# Patient Record
Sex: Male | Born: 1965 | Race: White | Hispanic: No | Marital: Single | Smoking: Former smoker
Health system: Southern US, Community
[De-identification: ages and names within clinical notes are randomized; demographics above are authoritative.]

---

## 1998-06-13 ENCOUNTER — Emergency Department (HOSPITAL_COMMUNITY): Admission: EM | Admit: 1998-06-13 | Discharge: 1998-06-13 | Payer: Self-pay | Admitting: Emergency Medicine

## 2020-10-05 ENCOUNTER — Ambulatory Visit
Admission: EM | Admit: 2020-10-05 | Discharge: 2020-10-05 | Disposition: A | Discharge status: Home / Self Care | Payer: Self-pay | Attending: Emergency Medicine | Admitting: Emergency Medicine

## 2020-10-05 ENCOUNTER — Other Ambulatory Visit: Payer: Self-pay

## 2020-10-05 ENCOUNTER — Encounter: Payer: Self-pay | Admitting: Emergency Medicine

## 2020-10-05 DIAGNOSIS — M778 Other enthesopathies, not elsewhere classified: Secondary | ICD-10-CM

## 2020-10-05 DIAGNOSIS — Z23 Encounter for immunization: Secondary | ICD-10-CM

## 2020-10-05 MED ORDER — CEPHALEXIN 500 MG PO CAPS: 500.0000 mg | ORAL_CAPSULE | Freq: Four times a day (QID) | ORAL | 0 refills | Status: DC

## 2020-10-05 MED ORDER — TETANUS-DIPHTH-ACELL PERTUSSIS 5-2.5-18.5 LF-MCG/0.5 IM SUSP
0.5000 mL | Freq: Once | INTRAMUSCULAR | Status: AC
  Administered 2020-10-05: 0.5 mL via INTRAMUSCULAR

## 2020-10-05 MED ORDER — PREDNISONE 10 MG (21) PO TBPK: ORAL_TABLET | Freq: Every day | ORAL | 0 refills | Status: DC

## 2020-10-05 MED ORDER — KETOROLAC TROMETHAMINE 60 MG/2ML IM SOLN
60.0000 mg | Freq: Once | INTRAMUSCULAR | Status: AC
  Administered 2020-10-05: 60 mg via INTRAMUSCULAR

## 2020-10-05 NOTE — ED Triage Notes (Signed)
Pt presents with abscess to the right elbow x 1 week. Denies fevers. Pt reports "cutting it open myself to get the infection out."

## 2020-10-05 NOTE — ED Provider Notes (Signed)
[place]   [id number] 10/05/20 Arrival Time: [place]   Chief Complaint  Patient presents with  . Abscess    SUBJECTIVE: History from: patient.  Dylan Shaw is a 54 y.o. male who presented to the urgent care with a complaint of right elbow pain and swelling for the past 1 week.  Denies any precipitating event.  Stated he completed an incision to the area a few days ago.  Stated discharge was clear yellowish.  Localized pain to right elbow.  He describes the pain as constant and achy.  He has tried OTC medications without relief.  His symptoms are made worse with ROM.  He denies similar symptoms in the past.   Unknown tetanus immunization status  ROS: As per HPI.  All other pertinent ROS negative.     History reviewed. No pertinent past medical history. History reviewed. No pertinent surgical history. Not on File No current facility-administered medications on file prior to encounter.   No current outpatient medications on file prior to encounter.   Social History   Socioeconomic History  . Marital status: Single    Spouse name: Not on file  . Number of children: Not on file  . Years of education: Not on file  . Highest education level: Not on file  Occupational History  . Not on file  Tobacco Use  . Smoking status: Not on file  Substance and Sexual Activity  . Alcohol use: Not on file  . Drug use: Not on file  . Sexual activity: Not on file  Other Topics Concern  . Not on file  Social History Narrative  . Not on file   Social Determinants of Health   Financial Resource Strain:   . Difficulty of Paying Living Expenses: Not on file  Food Insecurity:   . Worried About Charity fundraiser in the Last Year: Not on file  . Ran Out of Food in the Last Year: Not on file  Transportation Needs:   . Lack of Transportation (Medical): Not on file  . Lack of Transportation (Non-Medical): Not on file  Physical Activity:   . Days of Exercise per Week: Not on  file  . Minutes of Exercise per Session: Not on file  Stress:   . Feeling of Stress : Not on file  Social Connections:   . Frequency of Communication with Friends and Family: Not on file  . Frequency of Social Gatherings with Friends and Family: Not on file  . Attends Religious Services: Not on file  . Active Member of Clubs or Organizations: Not on file  . Attends Archivist Meetings: Not on file  . Marital Status: Not on file  Intimate Partner Violence:   . Fear of Current or Ex-Partner: Not on file  . Emotionally Abused: Not on file  . Physically Abused: Not on file  . Sexually Abused: Not on file   No family history on file.  OBJECTIVE:  Vitals:   10/05/20 0943 10/05/20 1032  BP: (!) 201/108 (!) 140/91  Pulse: 79   Resp: 17   Temp: 98.4 F (36.9 C)   TempSrc: Oral   SpO2: 96%      Physical Exam Vitals and nursing note reviewed.  Constitutional:      General: He is not in acute distress.    Appearance: Normal appearance. He is normal weight. He is not ill-appearing, toxic-appearing or diaphoretic.  Cardiovascular:     Rate and Rhythm: Normal rate and regular rhythm.  Pulses: Normal pulses.     Heart sounds: Normal heart sounds. No murmur heard.  No friction rub. No gallop.   Pulmonary:     Effort: Pulmonary effort is normal. No respiratory distress.     Breath sounds: Normal breath sounds. No stridor. No wheezing, rhonchi or rales.  Chest:     Chest wall: No tenderness.  Musculoskeletal:        General: Tenderness present.     Right elbow: Swelling and deformity present.     Left elbow: Normal.     Comments: The right elbow is with obvious deformity when compared to the left elbow.  Swelling, open wound, scars present.  No ecchymosis, lesion present.  Normal range of motion.  Neurovascular status intact.  Neurological:     Mental Status: He is alert and oriented to person, place, and time.     LABS:  No results found for this or any previous  visit (from the past 24 hour(s)).   ASSESSMENT & PLAN:  1. Right elbow tendinitis     Meds ordered this encounter  Medications  . Tdap (BOOSTRIX) injection 0.5 mL  . ketorolac (TORADOL) injection 60 mg  . cephALEXin (KEFLEX) 500 MG capsule    Sig: Take 1 capsule (500 mg total) by mouth 4 (four) times daily.    Dispense:  20 capsule    Refill:  0  . predniSONE (STERAPRED UNI-PAK 21 TAB) 10 MG (21) TBPK tablet    Sig: Take by mouth daily. Take 6 tabs by mouth daily  for 1 days, then 5 tabs for 1 days, then 4 tabs for 1 days, then 3 tabs for 1 days, 2 tabs for 1 days, then 1 tab by mouth daily for 1 days    Dispense:  21 tablet    Refill:  0   Patient is stable at discharge.  Tdap was given as he perform an incision at home.     Discharge instructions  Continue to take OTC Tylenol/ibuprofen as needed for pain Toradol IM was given in office for pain Prednisone was prescribed/take as directed Keflex was prescribed.  Take as directed Follow-up with PCP for further evaluation Return or go to ED for worsening of symptoms   Reviewed expectations re: course of current medical issues. Questions answered. Outlined signs and symptoms indicating need for more acute intervention. Patient verbalized understanding. After Visit Summary given.         Emerson Monte, FNP 10/05/20 1047

## 2020-10-05 NOTE — Discharge Instructions (Addendum)
Continue to take OTC Tylenol/ibuprofen as needed for pain Toradol IM was given in office for pain Prednisone was prescribed/take as directed Keflex was prescribed.  Take as directed Follow-up with PCP for further evaluation Return or go to ED for worsening of symptoms

## 2021-05-24 ENCOUNTER — Encounter (HOSPITAL_COMMUNITY): Payer: Self-pay

## 2021-05-24 ENCOUNTER — Inpatient Hospital Stay (HOSPITAL_COMMUNITY)
Admission: EM | Admit: 2021-05-24 | Discharge: 2021-06-28 | DRG: 028 | Disposition: A | Discharge status: Hospice | Payer: 59 | Attending: Internal Medicine | Admitting: Internal Medicine

## 2021-05-24 ENCOUNTER — Emergency Department (HOSPITAL_COMMUNITY): Payer: 59

## 2021-05-24 ENCOUNTER — Other Ambulatory Visit: Payer: Self-pay

## 2021-05-24 DIAGNOSIS — D630 Anemia in neoplastic disease: Secondary | ICD-10-CM | POA: Diagnosis present

## 2021-05-24 DIAGNOSIS — R54 Age-related physical debility: Secondary | ICD-10-CM | POA: Diagnosis present

## 2021-05-24 DIAGNOSIS — G40109 Localization-related (focal) (partial) symptomatic epilepsy and epileptic syndromes with simple partial seizures, not intractable, without status epilepticus: Secondary | ICD-10-CM | POA: Diagnosis not present

## 2021-05-24 DIAGNOSIS — E43 Unspecified severe protein-calorie malnutrition: Secondary | ICD-10-CM | POA: Diagnosis present

## 2021-05-24 DIAGNOSIS — Z20822 Contact with and (suspected) exposure to covid-19: Secondary | ICD-10-CM | POA: Diagnosis present

## 2021-05-24 DIAGNOSIS — C3490 Malignant neoplasm of unspecified part of unspecified bronchus or lung: Secondary | ICD-10-CM | POA: Diagnosis present

## 2021-05-24 DIAGNOSIS — S22000A Wedge compression fracture of unspecified thoracic vertebra, initial encounter for closed fracture: Secondary | ICD-10-CM | POA: Diagnosis not present

## 2021-05-24 DIAGNOSIS — C801 Malignant (primary) neoplasm, unspecified: Secondary | ICD-10-CM | POA: Diagnosis not present

## 2021-05-24 DIAGNOSIS — Z751 Person awaiting admission to adequate facility elsewhere: Secondary | ICD-10-CM

## 2021-05-24 DIAGNOSIS — Z7189 Other specified counseling: Secondary | ICD-10-CM | POA: Diagnosis not present

## 2021-05-24 DIAGNOSIS — D492 Neoplasm of unspecified behavior of bone, soft tissue, and skin: Secondary | ICD-10-CM | POA: Diagnosis not present

## 2021-05-24 DIAGNOSIS — M40204 Unspecified kyphosis, thoracic region: Secondary | ICD-10-CM | POA: Diagnosis present

## 2021-05-24 DIAGNOSIS — C7989 Secondary malignant neoplasm of other specified sites: Secondary | ICD-10-CM | POA: Diagnosis present

## 2021-05-24 DIAGNOSIS — G936 Cerebral edema: Secondary | ICD-10-CM | POA: Diagnosis present

## 2021-05-24 DIAGNOSIS — C3491 Malignant neoplasm of unspecified part of right bronchus or lung: Secondary | ICD-10-CM | POA: Diagnosis not present

## 2021-05-24 DIAGNOSIS — R52 Pain, unspecified: Secondary | ICD-10-CM

## 2021-05-24 DIAGNOSIS — C7951 Secondary malignant neoplasm of bone: Secondary | ICD-10-CM | POA: Diagnosis not present

## 2021-05-24 DIAGNOSIS — G40209 Localization-related (focal) (partial) symptomatic epilepsy and epileptic syndromes with complex partial seizures, not intractable, without status epilepticus: Secondary | ICD-10-CM | POA: Diagnosis not present

## 2021-05-24 DIAGNOSIS — L89152 Pressure ulcer of sacral region, stage 2: Secondary | ICD-10-CM | POA: Diagnosis not present

## 2021-05-24 DIAGNOSIS — Z66 Do not resuscitate: Secondary | ICD-10-CM | POA: Diagnosis not present

## 2021-05-24 DIAGNOSIS — Z515 Encounter for palliative care: Secondary | ICD-10-CM

## 2021-05-24 DIAGNOSIS — R4701 Aphasia: Secondary | ICD-10-CM | POA: Diagnosis not present

## 2021-05-24 DIAGNOSIS — G9529 Other cord compression: Secondary | ICD-10-CM | POA: Diagnosis not present

## 2021-05-24 DIAGNOSIS — R0602 Shortness of breath: Secondary | ICD-10-CM | POA: Diagnosis not present

## 2021-05-24 DIAGNOSIS — C7931 Secondary malignant neoplasm of brain: Secondary | ICD-10-CM | POA: Diagnosis not present

## 2021-05-24 DIAGNOSIS — I10 Essential (primary) hypertension: Secondary | ICD-10-CM | POA: Diagnosis not present

## 2021-05-24 DIAGNOSIS — N39498 Other specified urinary incontinence: Secondary | ICD-10-CM | POA: Diagnosis not present

## 2021-05-24 DIAGNOSIS — R64 Cachexia: Secondary | ICD-10-CM | POA: Diagnosis present

## 2021-05-24 DIAGNOSIS — M4804 Spinal stenosis, thoracic region: Secondary | ICD-10-CM | POA: Diagnosis present

## 2021-05-24 DIAGNOSIS — E46 Unspecified protein-calorie malnutrition: Secondary | ICD-10-CM | POA: Diagnosis present

## 2021-05-24 DIAGNOSIS — G9341 Metabolic encephalopathy: Secondary | ICD-10-CM | POA: Diagnosis not present

## 2021-05-24 DIAGNOSIS — C7949 Secondary malignant neoplasm of other parts of nervous system: Secondary | ICD-10-CM | POA: Diagnosis present

## 2021-05-24 DIAGNOSIS — Z808 Family history of malignant neoplasm of other organs or systems: Secondary | ICD-10-CM

## 2021-05-24 DIAGNOSIS — D72829 Elevated white blood cell count, unspecified: Secondary | ICD-10-CM | POA: Diagnosis not present

## 2021-05-24 DIAGNOSIS — C3431 Malignant neoplasm of lower lobe, right bronchus or lung: Secondary | ICD-10-CM | POA: Diagnosis not present

## 2021-05-24 DIAGNOSIS — Z681 Body mass index (BMI) 19 or less, adult: Secondary | ICD-10-CM

## 2021-05-24 DIAGNOSIS — L8990 Pressure ulcer of unspecified site, unspecified stage: Secondary | ICD-10-CM | POA: Insufficient documentation

## 2021-05-24 DIAGNOSIS — M4805 Spinal stenosis, thoracolumbar region: Secondary | ICD-10-CM | POA: Diagnosis present

## 2021-05-24 DIAGNOSIS — Z781 Physical restraint status: Secondary | ICD-10-CM

## 2021-05-24 DIAGNOSIS — M8448XA Pathological fracture, other site, initial encounter for fracture: Secondary | ICD-10-CM | POA: Diagnosis present

## 2021-05-24 DIAGNOSIS — M795 Residual foreign body in soft tissue: Secondary | ICD-10-CM | POA: Diagnosis not present

## 2021-05-24 DIAGNOSIS — Z87891 Personal history of nicotine dependence: Secondary | ICD-10-CM

## 2021-05-24 DIAGNOSIS — R159 Full incontinence of feces: Secondary | ICD-10-CM | POA: Diagnosis not present

## 2021-05-24 LAB — COMPREHENSIVE METABOLIC PANEL
ALT: 11 U/L (ref 0–44)
AST: 15 U/L (ref 15–41)
Albumin: 2.7 g/dL — ABNORMAL LOW (ref 3.5–5.0)
Alkaline Phosphatase: 92 U/L (ref 38–126)
Anion gap: 10 (ref 5–15)
BUN: 16 mg/dL (ref 6–20)
CO2: 30 mmol/L (ref 22–32)
Calcium: 9.3 mg/dL (ref 8.9–10.3)
Chloride: 92 mmol/L — ABNORMAL LOW (ref 98–111)
Creatinine, Ser: 0.8 mg/dL (ref 0.61–1.24)
GFR, Estimated: >60 mL/min (ref >60)
Glucose, Bld: 127 mg/dL — ABNORMAL HIGH (ref 70–99)
Potassium: 3.9 mmol/L (ref 3.5–5.1)
Sodium: 132 mmol/L — ABNORMAL LOW (ref 135–145)
Total Bilirubin: 0.9 mg/dL (ref 0.3–1.2)
Total Protein: 7.7 g/dL (ref 6.5–8.1)

## 2021-05-24 LAB — CBC WITH DIFFERENTIAL/PLATELET
Abs Immature Granulocytes: 0.05 10*3/uL (ref 0.00–0.07)
Basophils Absolute: 0 10*3/uL (ref 0.0–0.1)
Basophils Relative: 0 %
Eosinophils Absolute: 0 10*3/uL (ref 0.0–0.5)
Eosinophils Relative: 0 %
HCT: 38.5 % — ABNORMAL LOW (ref 39.0–52.0)
Hemoglobin: 12.7 g/dL — ABNORMAL LOW (ref 13.0–17.0)
Immature Granulocytes: 1 %
Lymphocytes Relative: 15 %
Lymphs Abs: 1.3 10*3/uL (ref 0.7–4.0)
MCH: 28.9 pg (ref 26.0–34.0)
MCHC: 33 g/dL (ref 30.0–36.0)
MCV: 87.5 fL (ref 80.0–100.0)
Monocytes Absolute: 0.7 10*3/uL (ref 0.1–1.0)
Monocytes Relative: 8 %
Neutro Abs: 6.9 10*3/uL (ref 1.7–7.7)
Neutrophils Relative %: 76 %
Platelets: 328 10*3/uL (ref 150–400)
RBC: 4.4 MIL/uL (ref 4.22–5.81)
RDW: 13.1 % (ref 11.5–15.5)
WBC: 9 10*3/uL (ref 4.0–10.5)
nRBC: 0 % (ref 0.0–0.2)

## 2021-05-24 LAB — RESP PANEL BY RT-PCR (FLU A&B, COVID) ARPGX2
Influenza A by PCR: NEGATIVE
Influenza B by PCR: NEGATIVE
SARS Coronavirus 2 by RT PCR: NEGATIVE

## 2021-05-24 MED ORDER — HEPARIN SODIUM (PORCINE) 5000 UNIT/ML IJ SOLN: 5000.0000 [IU] | Freq: Three times a day (TID) | INTRAMUSCULAR | Status: DC

## 2021-05-24 MED ORDER — GADOBUTROL 1 MMOL/ML IV SOLN
7.0000 mL | Freq: Once | INTRAVENOUS | Status: AC | PRN
  Administered 2021-05-24: 7 mL via INTRAVENOUS

## 2021-05-24 MED ORDER — ENSURE ENLIVE PO LIQD
237.0000 mL | Freq: Three times a day (TID) | ORAL | Status: DC
  Administered 2021-05-25 – 2021-06-28 (×89): 237 mL via ORAL
  Filled 2021-05-24 (×5): qty 237

## 2021-05-24 MED ORDER — MORPHINE SULFATE (PF) 2 MG/ML IV SOLN
1.0000 mg | INTRAVENOUS | Status: DC | PRN
Start: 2021-05-24 — End: 2021-05-28

## 2021-05-24 MED ORDER — HYDROCODONE-ACETAMINOPHEN 5-325 MG PO TABS
1.0000 | ORAL_TABLET | Freq: Four times a day (QID) | ORAL | Status: DC | PRN
  Administered 2021-05-27: 1 via ORAL
  Filled 2021-05-24: qty 1

## 2021-05-24 MED ORDER — DEXAMETHASONE SODIUM PHOSPHATE 10 MG/ML IJ SOLN
10.0000 mg | Freq: Once | INTRAMUSCULAR | Status: AC
  Administered 2021-05-24: 10 mg via INTRAVENOUS
  Filled 2021-05-24: qty 1

## 2021-05-24 MED ORDER — SODIUM CHLORIDE 0.9 % IV BOLUS
1000.0000 mL | Freq: Once | INTRAVENOUS | Status: AC
  Administered 2021-05-24: 1000 mL via INTRAVENOUS

## 2021-05-24 MED ORDER — DEXAMETHASONE SODIUM PHOSPHATE 4 MG/ML IJ SOLN
4.0000 mg | Freq: Four times a day (QID) | INTRAMUSCULAR | Status: DC
  Administered 2021-05-24 – 2021-05-27 (×4): 4 mg via INTRAVENOUS
  Filled 2021-05-24 (×7): qty 1

## 2021-05-24 MED ORDER — PANTOPRAZOLE SODIUM 40 MG PO TBEC
40.0000 mg | DELAYED_RELEASE_TABLET | Freq: Every day | ORAL | Status: DC
  Administered 2021-05-25: 40 mg via ORAL
  Filled 2021-05-24: qty 1

## 2021-05-24 MED ORDER — IOHEXOL 300 MG/ML  SOLN
75.0000 mL | Freq: Once | INTRAMUSCULAR | Status: AC | PRN
  Administered 2021-05-24: 75 mL via INTRAVENOUS

## 2021-05-24 NOTE — ED Notes (Signed)
Patient transported to MRI 

## 2021-05-24 NOTE — ED Notes (Signed)
Patient transported to CT 
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
## 2021-05-24 NOTE — ED Provider Notes (Signed)
Patient with metastatic lung cancer with lesions in the lower thoracic and upper lumbar spine.  MRI shows moderate cord compression and foraminal compression.  I spoke with Dr. Delton Coombes and he recommends admission to [facility] by the hospitalist.  He states that neurosurgery and radiation oncology should be consulted tomorrow   Milton Ferguson, MD 05/24/21 1846

## 2021-05-24 NOTE — ED Triage Notes (Signed)
Pt presents to ED with decreased appetite x 4 weeks. Pt states he hasn't had an appetite to eat and has lost 50 lbs in last month. Pt denies N/V or pain.

## 2021-05-24 NOTE — Progress Notes (Signed)
RAD/ONC NOTE TO CHART  I spoke tonight with Dr. Waldron Labs about this patient, a 55 yo male smoker who is currently at [facility].  He presented with weight loss and back pain.   I have reviewed his imaging personally.  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   MRI Lumbar spine revealed large/destructive osseous metastasis at T12 level with large soft tissue masses involving the posterior elements and left anterior paraspinal soft tissues. There is epidural in left foraminal extension of tumor with resulting moderate canal stenosis and severe left T12-L1 foraminal stenosis. This is not spinal cord compression, however.  Pathologic fracture of the T12 vertebral body with 50% height loss. Additional smaller posterior T11 osseous metastasis and sacral/iliac metastases.    He does not have focal deficits per Dr. Waldron Labs and he has been started on dexamethasone. He may have some reduction in urinary continence at times.  [facility] opinion pending.  Transfer to [facility] pending.  Generally, radiation oncology does not perform emergent radiotherapy unless patients have a spinal cord compression that is not amenable to surgical decompression. His disease is causing moderate canal stenosis and significant foraminal stenosis but not a frank cord compression.  If not resected, our team will likely start palliative treatment early next week when our radiation therapists and physicists are also back on site and a high quality plan can be produced (05-28-21). Ideally, inpatient workup including tissue diagnosis will be available by that time. Continue steroids.   If the inpatient team recognizes new neurologic deficits over the weekend, please do not hesitate to page me at [phone].  -----------------------------------  Eppie Gibson, MD

## 2021-05-24 NOTE — ED Notes (Signed)
Brother, Herbie [place], Psychologist, counselling [phone]

## 2021-05-24 NOTE — ED Provider Notes (Signed)
[facility] Provider Note   CSN: [id number] Arrival date & time: 05/24/21  1047     History No chief complaint on file.   Dylan Shaw is a 55 y.o. male.  HPI Patient presents for evaluation of unintended weight loss of 50 pounds over 6 weeks.  He reports this happened after he "pulled a muscle in my back."  He states he has no taste for food, therefore is not eating much.  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  He denies pain anywhere.  He states he is never had this happen previously.  There are no other known active modifying factors.    History reviewed. No pertinent past medical history.  There are no problems to display for this patient.   Past Surgical History:  Procedure Laterality Date  . LEG SURGERY         No family history on file.  Social History   Tobacco Use  . Smoking status: Former Research scientist (life sciences)  . Smokeless tobacco: Never Used  Substance Use Topics  . Alcohol use: Not Currently  . Drug use: Never    Home Medications Prior to Admission medications   Medication Sig Start Date End Date Taking? Authorizing Provider  cephALEXin (KEFLEX) 500 MG capsule Take 1 capsule (500 mg total) by mouth 4 (four) times daily. Patient not taking: Reported on 05/24/2021 10/05/20   Emerson Monte, FNP  predniSONE (STERAPRED UNI-PAK 21 TAB) 10 MG (21) TBPK tablet Take by mouth daily. Take 6 tabs by mouth daily  for 1 days, then 5 tabs for 1 days, then 4 tabs for 1 days, then 3 tabs for 1 days, 2 tabs for 1 days, then 1 tab by mouth daily for 1 days Patient not taking: Reported on 05/24/2021 10/05/20   Emerson Monte, FNP    Allergies    Patient has no known allergies.  Review of Systems   Review of Systems  All other systems reviewed and are negative.   Physical Exam Updated Vital Signs BP (!) 144/113   Pulse 89   Temp 97.9 F (36.6 C) (Axillary)   Resp 18   Ht 5\' 7"  (1.702 m)   Wt 59 kg   SpO2 100%   BMI 20.36 kg/m   Physical  Exam Vitals and nursing note reviewed.  Constitutional:      General: He is not in acute distress.    Appearance: He is well-developed. He is not ill-appearing, toxic-appearing or diaphoretic.     Comments: Cachectic appearing  HENT:     Head: Normocephalic and atraumatic.     Right Ear: External ear normal.     Left Ear: External ear normal.  Eyes:     Conjunctiva/sclera: Conjunctivae normal.     Pupils: Pupils are equal, round, and reactive to light.  Neck:     Trachea: Phonation normal.  Cardiovascular:     Rate and Rhythm: Normal rate and regular rhythm.     Heart sounds: Normal heart sounds.  Pulmonary:     Effort: Pulmonary effort is normal.     Breath sounds: Normal breath sounds.  Abdominal:     General: There is no distension.     Palpations: Abdomen is soft.     Tenderness: There is no abdominal tenderness.  Musculoskeletal:        General: Normal range of motion.     Cervical back: Normal range of motion and neck supple.  Skin:    General: Skin is  warm and dry.  Neurological:     Mental Status: He is alert and oriented to person, place, and time.     Cranial Nerves: No cranial nerve deficit.     Sensory: No sensory deficit.     Motor: No abnormal muscle tone.     Coordination: Coordination normal.  Psychiatric:        Behavior: Behavior normal.        Thought Content: Thought content normal.        Judgment: Judgment normal.     ED Results / Procedures / Treatments   Labs (all labs ordered are listed, but only abnormal results are displayed) Labs Reviewed  COMPREHENSIVE METABOLIC PANEL - Abnormal; Notable for the following components:      Result Value   Sodium 132 (*)    Chloride 92 (*)    Glucose, Bld 127 (*)    Albumin 2.7 (*)    All other components within normal limits  CBC WITH DIFFERENTIAL/PLATELET - Abnormal; Notable for the following components:   Hemoglobin 12.7 (*)    HCT 38.5 (*)    All other components within normal limits  RESP PANEL  BY RT-PCR (FLU A&B, COVID) [id number]  URINALYSIS, ROUTINE W REFLEX MICROSCOPIC    EKG None  Radiology CT Chest W Contrast  Result Date: 05/24/2021 CLINICAL DATA:  Decreased appetite , weight loss EXAM: CT CHEST, ABDOMEN, AND PELVIS WITH CONTRAST TECHNIQUE: Multidetector CT imaging of the chest, abdomen and pelvis was performed following the standard protocol during bolus administration of intravenous contrast. CONTRAST:  42mL OMNIPAQUE IOHEXOL 300 MG/ML  SOLN COMPARISON:  None. FINDINGS: CT CHEST FINDINGS Cardiovascular: Heart size normal. No pericardial effusion. Calcified plaque in the aortic arch. Mediastinum/Nodes: Enlarged right hilar lymph nodes up to 1.9 cm short axis diameter. 1.4 cm subcarinal adenopathy. 1 cm enlarged precarinal node. Prominent subcentimeter right paratracheal and AP window lymph nodes. Lungs/Pleura: No pleural effusion. Partially calcified left pleural plaque. No pneumothorax. 5.4 cm lobular pleural-based mass, superior segment right lower lobe. Anteriorly and superiorly is a cluster of approximately 6 nodules measuring up to 2.3 cm. Bandlike scarring or subsegmental atelectasis in the anterior right upper lobe. 3 mm subpleural nodule medially in the left upper lobe (Im27,Se5) . left lung otherwise clear. Musculoskeletal: 9.5 cm complex lytic lesion involving the T12 vertebral body, left pedicle and posterior elements with extraosseous extension and epidural extension of tumor resulting in at least mild narrowing of the central canal. There is moderate pathologic compression deformity of the T12 vertebral body. Old fracture deformities of multiple right ribs. Lytic lesion involving the lateral aspect right sixth rib. Bilateral shoulder DJD. CT ABDOMEN PELVIS FINDINGS Hepatobiliary: No focal liver abnormality is seen. No gallstones, gallbladder wall thickening, or biliary dilatation. Pancreas: Unremarkable. No pancreatic ductal dilatation or surrounding inflammatory changes. Spleen:  Normal in size. 1 cm poorly marginated low-attenuation lesion inferolaterally, indeterminate. Adrenals/Urinary Tract: Left adrenal nodule measuring at least 1.2 cm. Right adrenal unremarkable. Normal renal enhancement without focal lesion, hydronephrosis, or urolithiasis. The urinary bladder is incompletely distended. Stomach/Bowel: The stomach is nondistended. Small bowel decompressed. Normal appendix. The colon is nondilated, unremarkable. Vascular/Lymphatic: Moderate aortic and iliofemoral calcified arterial plaque without aneurysm or evident high-grade stenosis. No abdominal or pelvic adenopathy. Reproductive: Mild prostate enlargement with central coarse calcification. Other: No ascites.  No free air. Musculoskeletal: 1 cm lytic lesion involving the medial aspect right ilium. 5.1 cm lytic lesion involving left pubic bone with extraosseous extension of tumor anteriorly. 0.7 cm lytic  lesion involving the anterior margin of the ilium. No pathologic fracture. IMPRESSION: 1. 5.4 cm right lower lobe lung mass with satellite nodules, right hilar and mediastinal adenopathy, osseous and possibly left adrenal metastatic disease as above, suggesting stage IV lung carcinoma. 2. Pathologic T12 compression fracture with epidural extension of tumor and at least mild spinal stenosis. 3.  Aortic Atherosclerosis (ICD10-170.0). Electronically Signed   By: Lucrezia Europe M.D.   On: 05/24/2021 13:17   CT Abdomen Pelvis W Contrast  Result Date: 05/24/2021 CLINICAL DATA:  Decreased appetite , weight loss EXAM: CT CHEST, ABDOMEN, AND PELVIS WITH CONTRAST TECHNIQUE: Multidetector CT imaging of the chest, abdomen and pelvis was performed following the standard protocol during bolus administration of intravenous contrast. CONTRAST:  16mL OMNIPAQUE IOHEXOL 300 MG/ML  SOLN COMPARISON:  None. FINDINGS: CT CHEST FINDINGS Cardiovascular: Heart size normal. No pericardial effusion. Calcified plaque in the aortic arch. Mediastinum/Nodes:  Enlarged right hilar lymph nodes up to 1.9 cm short axis diameter. 1.4 cm subcarinal adenopathy. 1 cm enlarged precarinal node. Prominent subcentimeter right paratracheal and AP window lymph nodes. Lungs/Pleura: No pleural effusion. Partially calcified left pleural plaque. No pneumothorax. 5.4 cm lobular pleural-based mass, superior segment right lower lobe. Anteriorly and superiorly is a cluster of approximately 6 nodules measuring up to 2.3 cm. Bandlike scarring or subsegmental atelectasis in the anterior right upper lobe. 3 mm subpleural nodule medially in the left upper lobe (Im27,Se5) . left lung otherwise clear. Musculoskeletal: 9.5 cm complex lytic lesion involving the T12 vertebral body, left pedicle and posterior elements with extraosseous extension and epidural extension of tumor resulting in at least mild narrowing of the central canal. There is moderate pathologic compression deformity of the T12 vertebral body. Old fracture deformities of multiple right ribs. Lytic lesion involving the lateral aspect right sixth rib. Bilateral shoulder DJD. CT ABDOMEN PELVIS FINDINGS Hepatobiliary: No focal liver abnormality is seen. No gallstones, gallbladder wall thickening, or biliary dilatation. Pancreas: Unremarkable. No pancreatic ductal dilatation or surrounding inflammatory changes. Spleen: Normal in size. 1 cm poorly marginated low-attenuation lesion inferolaterally, indeterminate. Adrenals/Urinary Tract: Left adrenal nodule measuring at least 1.2 cm. Right adrenal unremarkable. Normal renal enhancement without focal lesion, hydronephrosis, or urolithiasis. The urinary bladder is incompletely distended. Stomach/Bowel: The stomach is nondistended. Small bowel decompressed. Normal appendix. The colon is nondilated, unremarkable. Vascular/Lymphatic: Moderate aortic and iliofemoral calcified arterial plaque without aneurysm or evident high-grade stenosis. No abdominal or pelvic adenopathy. Reproductive: Mild  prostate enlargement with central coarse calcification. Other: No ascites.  No free air. Musculoskeletal: 1 cm lytic lesion involving the medial aspect right ilium. 5.1 cm lytic lesion involving left pubic bone with extraosseous extension of tumor anteriorly. 0.7 cm lytic lesion involving the anterior margin of the ilium. No pathologic fracture. IMPRESSION: 1. 5.4 cm right lower lobe lung mass with satellite nodules, right hilar and mediastinal adenopathy, osseous and possibly left adrenal metastatic disease as above, suggesting stage IV lung carcinoma. 2. Pathologic T12 compression fracture with epidural extension of tumor and at least mild spinal stenosis. 3.  Aortic Atherosclerosis (ICD10-170.0). Electronically Signed   By: Lucrezia Europe M.D.   On: 05/24/2021 13:17    Procedures Procedures   Medications Ordered in ED Medications  sodium chloride 0.9 % bolus 1,000 mL (1,000 mLs Intravenous New Bag/Given 05/24/21 1144)  iohexol (OMNIPAQUE) 300 MG/ML solution 75 mL (75 mLs Intravenous Contrast Given 05/24/21 1226)    ED Course  I have reviewed the triage vital signs and the nursing notes.  Pertinent labs &  imaging results that were available during my care of the patient were reviewed by me and considered in my medical decision making (see chart for details).  Clinical Course as of 05/24/21 1544  Fri May 24, 2021  1505 Case discussed with oncologist, Dr. Delton Coombes, who recommends MRI imaging of the back, to evaluate for potential spinal cord compromise.  In the absence of that the patient can be discharged to follow-up in the office with Dr. Delton Coombes, in 4 days. [EW]    Clinical Course User Index [EW] Daleen Bo, MD   MDM Rules/Calculators/A&P                           Patient Vitals for the past 24 hrs:  BP Temp Temp src Pulse Resp SpO2 Height Weight  05/24/21 1400 (!) 144/113 -- -- 89 18 100 % -- --  05/24/21 1330 (!) 168/129 -- -- 90 (!) 24 100 % -- --  05/24/21 1302 (!) 143/100  -- -- 92 15 95 % -- --  05/24/21 1215 -- -- -- -- (!) 22 -- -- --  05/24/21 1200 (!) 127/103 -- -- -- (!) 24 -- -- --  05/24/21 1145 -- -- -- -- 16 -- -- --  05/24/21 1141 (!) 120/94 -- -- -- (!) 24 -- -- --  05/24/21 1130 -- -- -- -- 14 -- -- --  05/24/21 1121 -- 97.9 F (36.6 C) Axillary (!) 107 16 -- -- --  05/24/21 1118 -- -- -- -- -- -- 5\' 7"  (1.702 m) 59 kg    3:15PM Reevaluation with update and discussion. After initial assessment and treatment, an updated evaluation reveals no change in clinical status, findings discussed with patient and his brother at the bedside all questions were answered. Daleen Bo  Patient's brother specifies that he is not sure whether the patient can be managed at home because home is in disrepair and the patient has not done well caring for himself.  Medical Decision Making:  This patient is presenting for evaluation of weight loss, and back pain, which does require a range of treatment options, and is a complaint that involves a high risk of morbidity and mortality. The differential diagnoses include malaise, nonspecific illness, oncologic disorder. I decided to review old records, and in summary middle-aged male presenting with significant weight loss and back pain.  I obtained additional historical information from brother at bedside.  Clinical Laboratory Tests Ordered, included CBC, Metabolic panel and Viral panel. Review indicates normal except sodium low, glucose high, chloride low, hemoglobin low. Radiologic Tests Ordered, included CT chest, CT abdomen pelvis.  I independently Visualized: Radiographic images, which show stage IV lung cancer, right lower lobe, with metastatic disease, T12 compression fracture and epidural tumor   Critical Interventions-evaluation, laboratory testing, radiography, observation reassessment.  Advanced imaging, lumbar spine ordered to evaluate spinal cord.  After These Interventions, the Patient was reevaluated and  was found with newly diagnosed stage IV lung cancer.  Compression fraction present, with epidural tumor, extent not specified on CT imaging, requiring MRI imaging  CRITICAL CARE-yes Performed by: Daleen Bo  Nursing Notes Reviewed/ Care Coordinated Applicable Imaging Reviewed Interpretation of Laboratory Data incorporated into ED treatment  Plan-disposition after MRI imaging.  Patient can potentially be discharged felt to be safe, assuming no spinal cord compression.  If that is the case he will need to follow-up with oncology as an outpatient in 4 days.    Final Clinical Impression(s) /  ED Diagnoses Final diagnoses:  Foreign body (FB) in soft tissue  Primary malignant neoplasm of right lung metastatic to other site [facility])  Compression fracture of body of thoracic vertebra [facility])    Rx / DC Orders ED Discharge Orders    None       Daleen Bo, MD 05/24/21 1545

## 2021-05-25 ENCOUNTER — Inpatient Hospital Stay (HOSPITAL_COMMUNITY): Payer: 59 | Admitting: Anesthesiology

## 2021-05-25 ENCOUNTER — Encounter (HOSPITAL_COMMUNITY): Payer: Self-pay | Admitting: Internal Medicine

## 2021-05-25 ENCOUNTER — Inpatient Hospital Stay (HOSPITAL_COMMUNITY): Payer: 59

## 2021-05-25 ENCOUNTER — Encounter (HOSPITAL_COMMUNITY)
Admission: EM | Disposition: A | Discharge status: Hospice | Payer: Self-pay | Source: Home / Self Care | Attending: Internal Medicine

## 2021-05-25 DIAGNOSIS — C3431 Malignant neoplasm of lower lobe, right bronchus or lung: Secondary | ICD-10-CM | POA: Diagnosis not present

## 2021-05-25 DIAGNOSIS — C3491 Malignant neoplasm of unspecified part of right bronchus or lung: Secondary | ICD-10-CM

## 2021-05-25 DIAGNOSIS — C801 Malignant (primary) neoplasm, unspecified: Secondary | ICD-10-CM

## 2021-05-25 DIAGNOSIS — C7951 Secondary malignant neoplasm of bone: Secondary | ICD-10-CM | POA: Diagnosis present

## 2021-05-25 DIAGNOSIS — S22000A Wedge compression fracture of unspecified thoracic vertebra, initial encounter for closed fracture: Secondary | ICD-10-CM

## 2021-05-25 LAB — CBC
HCT: 35.8 % — ABNORMAL LOW (ref 39.0–52.0)
Hemoglobin: 11.6 g/dL — ABNORMAL LOW (ref 13.0–17.0)
MCH: 28.3 pg (ref 26.0–34.0)
MCHC: 32.4 g/dL (ref 30.0–36.0)
MCV: 87.3 fL (ref 80.0–100.0)
Platelets: 335 10*3/uL (ref 150–400)
RBC: 4.1 MIL/uL — ABNORMAL LOW (ref 4.22–5.81)
RDW: 13.4 % (ref 11.5–15.5)
WBC: 5.9 10*3/uL (ref 4.0–10.5)
nRBC: 0 % (ref 0.0–0.2)

## 2021-05-25 LAB — BASIC METABOLIC PANEL
Anion gap: 8 (ref 5–15)
BUN: 15 mg/dL (ref 6–20)
CO2: 29 mmol/L (ref 22–32)
Calcium: 9.4 mg/dL (ref 8.9–10.3)
Chloride: 99 mmol/L (ref 98–111)
Creatinine, Ser: 0.51 mg/dL — ABNORMAL LOW (ref 0.61–1.24)
GFR, Estimated: >60 mL/min (ref >60)
Glucose, Bld: 116 mg/dL — ABNORMAL HIGH (ref 70–99)
Potassium: 4.6 mmol/L (ref 3.5–5.1)
Sodium: 136 mmol/L (ref 135–145)

## 2021-05-25 LAB — POCT I-STAT 7, (LYTES, BLD GAS, ICA,H+H)
Acid-Base Excess: 3 mmol/L — ABNORMAL HIGH (ref 0.0–2.0)
Acid-Base Excess: 1 mmol/L (ref 0.0–2.0)
Bicarbonate: 28.9 mmol/L — ABNORMAL HIGH (ref 20.0–28.0)
Bicarbonate: 28.4 mmol/L — ABNORMAL HIGH (ref 20.0–28.0)
Calcium, Ion: 1.17 mmol/L (ref 1.15–1.40)
Calcium, Ion: 1.21 mmol/L (ref 1.15–1.40)
HCT: 27 % — ABNORMAL LOW (ref 39.0–52.0)
HCT: 27 % — ABNORMAL LOW (ref 39.0–52.0)
Hemoglobin: 9.2 g/dL — ABNORMAL LOW (ref 13.0–17.0)
Hemoglobin: 9.2 g/dL — ABNORMAL LOW (ref 13.0–17.0)
O2 Saturation: 100 %
O2 Saturation: 98 %
Patient temperature: 36.6
Patient temperature: 36.8
Potassium: 3.7 mmol/L (ref 3.5–5.1)
Potassium: 4.1 mmol/L (ref 3.5–5.1)
Sodium: 136 mmol/L (ref 135–145)
Sodium: 136 mmol/L (ref 135–145)
TCO2: 30 mmol/L (ref 22–32)
TCO2: 30 mmol/L (ref 22–32)
pCO2 arterial: 47.6 mmHg (ref 32.0–48.0)
pCO2 arterial: 58.6 mmHg — ABNORMAL HIGH (ref 32.0–48.0)
pH, Arterial: 7.39 (ref 7.350–7.450)
pH, Arterial: 7.292 — ABNORMAL LOW (ref 7.350–7.450)
pO2, Arterial: 349 mmHg — ABNORMAL HIGH (ref 83.0–108.0)
pO2, Arterial: 127 mmHg — ABNORMAL HIGH (ref 83.0–108.0)

## 2021-05-25 LAB — SURGICAL PCR SCREEN
MRSA, PCR: NEGATIVE
Staphylococcus aureus: NEGATIVE

## 2021-05-25 LAB — HIV ANTIBODY (ROUTINE TESTING W REFLEX): HIV Screen 4th Generation wRfx: NONREACTIVE

## 2021-05-25 LAB — ABO/RH: ABO/RH(D): B POS

## 2021-05-25 SURGERY — POSTERIOR LUMBAR FUSION 2 LEVEL
Anesthesia: General | Site: Spine Thoracic

## 2021-05-25 MED ORDER — LACTATED RINGERS IV SOLN: INTRAVENOUS | Status: DC | PRN

## 2021-05-25 MED ORDER — LIDOCAINE-EPINEPHRINE 1 %-1:100000 IJ SOLN
INTRAMUSCULAR | Status: DC | PRN
  Administered 2021-05-25: 12 mL
  Administered 2021-05-25: 4 mL

## 2021-05-25 MED ORDER — ONDANSETRON HCL 4 MG/2ML IJ SOLN
INTRAMUSCULAR | Status: DC | PRN
  Administered 2021-05-25: 4 mg via INTRAVENOUS

## 2021-05-25 MED ORDER — MIDAZOLAM HCL 2 MG/2ML IJ SOLN
INTRAMUSCULAR | Status: AC
  Filled 2021-05-25: qty 2

## 2021-05-25 MED ORDER — LACTATED RINGERS IV SOLN: INTRAVENOUS | Status: DC

## 2021-05-25 MED ORDER — ALBUMIN HUMAN 5 % IV SOLN: INTRAVENOUS | Status: DC | PRN

## 2021-05-25 MED ORDER — MIDAZOLAM HCL 5 MG/5ML IJ SOLN
INTRAMUSCULAR | Status: DC | PRN
  Administered 2021-05-25: 2 mg via INTRAVENOUS

## 2021-05-25 MED ORDER — BUPIVACAINE LIPOSOME 1.3 % IJ SUSP
INTRAMUSCULAR | Status: AC
  Filled 2021-05-25: qty 20

## 2021-05-25 MED ORDER — CEFAZOLIN SODIUM-DEXTROSE 2-3 GM-%(50ML) IV SOLR
INTRAVENOUS | Status: DC | PRN
  Administered 2021-05-25: 2 g via INTRAVENOUS

## 2021-05-25 MED ORDER — PHENYLEPHRINE 40 MCG/ML (10ML) SYRINGE FOR IV PUSH (FOR BLOOD PRESSURE SUPPORT)
PREFILLED_SYRINGE | INTRAVENOUS | Status: DC | PRN
  Administered 2021-05-25: 120 ug via INTRAVENOUS

## 2021-05-25 MED ORDER — PROMETHAZINE HCL 25 MG/ML IJ SOLN: 6.2500 mg | INTRAMUSCULAR | Status: DC | PRN

## 2021-05-25 MED ORDER — THROMBIN 5000 UNITS EX SOLR
CUTANEOUS | Status: AC
  Filled 2021-05-25: qty 5000

## 2021-05-25 MED ORDER — DEXAMETHASONE SODIUM PHOSPHATE 10 MG/ML IJ SOLN
INTRAMUSCULAR | Status: DC | PRN
  Administered 2021-05-25: 10 mg via INTRAVENOUS

## 2021-05-25 MED ORDER — 0.9 % SODIUM CHLORIDE (POUR BTL) OPTIME
TOPICAL | Status: DC | PRN
  Administered 2021-05-25: 1000 mL

## 2021-05-25 MED ORDER — PROPOFOL 500 MG/50ML IV EMUL
INTRAVENOUS | Status: DC | PRN
  Administered 2021-05-25: 150 ug/kg/min via INTRAVENOUS
  Administered 2021-05-25: 100 ug/kg/min via INTRAVENOUS

## 2021-05-25 MED ORDER — LIDOCAINE-EPINEPHRINE 1 %-1:100000 IJ SOLN
INTRAMUSCULAR | Status: AC
  Filled 2021-05-25: qty 1

## 2021-05-25 MED ORDER — FENTANYL CITRATE (PF) 250 MCG/5ML IJ SOLN
INTRAMUSCULAR | Status: DC | PRN
  Administered 2021-05-25 (×2): 50 ug via INTRAVENOUS
  Administered 2021-05-25 (×2): 25 ug via INTRAVENOUS
  Administered 2021-05-25 (×3): 50 ug via INTRAVENOUS

## 2021-05-25 MED ORDER — FENTANYL CITRATE (PF) 100 MCG/2ML IJ SOLN
25.0000 ug | INTRAMUSCULAR | Status: DC | PRN
  Administered 2021-05-26: 50 ug via INTRAVENOUS

## 2021-05-25 MED ORDER — FENTANYL CITRATE (PF) 250 MCG/5ML IJ SOLN
INTRAMUSCULAR | Status: AC
  Filled 2021-05-25: qty 5

## 2021-05-25 MED ORDER — THROMBIN 5000 UNITS EX SOLR: OROMUCOSAL | Status: DC | PRN

## 2021-05-25 MED ORDER — EPHEDRINE SULFATE 50 MG/ML IJ SOLN
INTRAMUSCULAR | Status: DC | PRN
  Administered 2021-05-25: 10 mg via INTRAVENOUS
  Administered 2021-05-25 (×6): 5 mg via INTRAVENOUS

## 2021-05-25 MED ORDER — ORAL CARE MOUTH RINSE
15.0000 mL | Freq: Once | OROMUCOSAL | Status: AC
Start: 2021-05-25 — End: 2021-05-25

## 2021-05-25 MED ORDER — LIDOCAINE 2% (20 MG/ML) 5 ML SYRINGE
INTRAMUSCULAR | Status: DC | PRN
  Administered 2021-05-25: 60 mg via INTRAVENOUS

## 2021-05-25 MED ORDER — CHLORHEXIDINE GLUCONATE 0.12 % MT SOLN
OROMUCOSAL | Status: AC
  Administered 2021-05-25: 15 mL via OROMUCOSAL
  Filled 2021-05-25: qty 15

## 2021-05-25 MED ORDER — PROPOFOL 10 MG/ML IV BOLUS
INTRAVENOUS | Status: DC | PRN
  Administered 2021-05-25: 80 mg via INTRAVENOUS
  Administered 2021-05-25: 50 mg via INTRAVENOUS

## 2021-05-25 MED ORDER — BUPIVACAINE HCL (PF) 0.5 % IJ SOLN
INTRAMUSCULAR | Status: DC | PRN
  Administered 2021-05-25: 4 mL
  Administered 2021-05-25: 12 mL

## 2021-05-25 MED ORDER — BUPIVACAINE HCL (PF) 0.5 % IJ SOLN
INTRAMUSCULAR | Status: AC
  Filled 2021-05-25: qty 30

## 2021-05-25 MED ORDER — CHLORHEXIDINE GLUCONATE 0.12 % MT SOLN: 15.0000 mL | Freq: Once | OROMUCOSAL | Status: AC

## 2021-05-25 MED ORDER — PHENYLEPHRINE HCL-NACL 10-0.9 MG/250ML-% IV SOLN
INTRAVENOUS | Status: DC | PRN
  Administered 2021-05-25: 30 ug/min via INTRAVENOUS

## 2021-05-25 MED ORDER — SUCCINYLCHOLINE CHLORIDE 200 MG/10ML IV SOSY
PREFILLED_SYRINGE | INTRAVENOUS | Status: DC | PRN
  Administered 2021-05-25: 80 mg via INTRAVENOUS

## 2021-05-25 SURGICAL SUPPLY — 64 items
ADH SKN CLS APL DERMABOND .7 (GAUZE/BANDAGES/DRESSINGS) ×3
BUR MATCHSTICK NEURO 3.0 LAGG (BURR) ×3 IMPLANT
BUR PRECISION FLUTE 5.0 (BURR) ×3 IMPLANT
CANISTER SUCT 3000ML PPV (MISCELLANEOUS) ×3 IMPLANT
CARTRIDGE OIL MAESTRO DRILL (MISCELLANEOUS) ×1 IMPLANT
CLIP NEUROVISION LG (CLIP) ×2 IMPLANT
CNTNR URN SCR LID CUP LEK RST (MISCELLANEOUS) ×1 IMPLANT
CONT SPEC 4OZ STRL OR WHT (MISCELLANEOUS) ×6
COVER BACK TABLE 60X90IN (DRAPES) ×3 IMPLANT
DERMABOND ADVANCED (GAUZE/BANDAGES/DRESSINGS) ×6
DERMABOND ADVANCED .7 DNX12 (GAUZE/BANDAGES/DRESSINGS) ×1 IMPLANT
DIFFUSER DRILL AIR PNEUMATIC (MISCELLANEOUS) ×3 IMPLANT
DRAPE C-ARM 42X72 X-RAY (DRAPES) ×3 IMPLANT
DRAPE C-ARMOR (DRAPES) ×3 IMPLANT
DRAPE LAPAROTOMY 100X72X124 (DRAPES) ×3 IMPLANT
DRAPE SURG 17X23 STRL (DRAPES) ×3 IMPLANT
DRSG OPSITE POSTOP 4X12 (GAUZE/BANDAGES/DRESSINGS) ×4 IMPLANT
DURAPREP 26ML APPLICATOR (WOUND CARE) ×3 IMPLANT
ELECT REM PT RETURN 9FT ADLT (ELECTROSURGICAL) ×3
ELECTRODE REM PT RTRN 9FT ADLT (ELECTROSURGICAL) ×1 IMPLANT
EVACUATOR 1/8 PVC DRAIN (DRAIN) IMPLANT
GAUZE SPONGE 4X4 12PLY STRL (GAUZE/BANDAGES/DRESSINGS) ×3 IMPLANT
GLOVE BIO SURGEON STRL SZ8 (GLOVE) ×6 IMPLANT
GLOVE BIOGEL PI IND STRL 8.5 (GLOVE) ×2 IMPLANT
GLOVE BIOGEL PI INDICATOR 8.5 (GLOVE) ×4
GLOVE ECLIPSE 8.0 STRL XLNG CF (GLOVE) ×6 IMPLANT
GLOVE SRG 8 PF TXTR STRL LF DI (GLOVE) ×2 IMPLANT
GLOVE SURG UNDER POLY LF SZ8 (GLOVE) ×6
GOWN STRL REUS W/ TWL LRG LVL3 (GOWN DISPOSABLE) IMPLANT
GOWN STRL REUS W/ TWL XL LVL3 (GOWN DISPOSABLE) ×3 IMPLANT
GOWN STRL REUS W/TWL 2XL LVL3 (GOWN DISPOSABLE) IMPLANT
GOWN STRL REUS W/TWL LRG LVL3 (GOWN DISPOSABLE) ×6
GOWN STRL REUS W/TWL XL LVL3 (GOWN DISPOSABLE) ×9
GUIDEWIRE NITINOL BEVEL TIP (WIRE) ×16 IMPLANT
HEMOSTAT POWDER KIT SURGIFOAM (HEMOSTASIS) ×3 IMPLANT
KIT BASIN OR (CUSTOM PROCEDURE TRAY) ×3 IMPLANT
KIT POSITION SURG JACKSON T1 (MISCELLANEOUS) ×3 IMPLANT
KIT TURNOVER KIT B (KITS) ×3 IMPLANT
MODULE NVM5 NEXT GEN EMG (NEEDLE) ×2 IMPLANT
NDL HYPO 25X1 1.5 SAFETY (NEEDLE) ×1 IMPLANT
NDL I PASS (NEEDLE) IMPLANT
NDL SPNL 18GX3.5 QUINCKE PK (NEEDLE) IMPLANT
NEEDLE HYPO 25X1 1.5 SAFETY (NEEDLE) ×3 IMPLANT
NEEDLE I PASS (NEEDLE) ×3 IMPLANT
NEEDLE SPNL 18GX3.5 QUINCKE PK (NEEDLE) ×3 IMPLANT
NS IRRIG 1000ML POUR BTL (IV SOLUTION) ×3 IMPLANT
OIL CARTRIDGE MAESTRO DRILL (MISCELLANEOUS) ×3
PACK LAMINECTOMY NEURO (CUSTOM PROCEDURE TRAY) ×3 IMPLANT
ROD SPINAL 5.5X150MM TI POST (Rod) ×2 IMPLANT
ROD SPINAL 5.5X160 TI POST (Rod) ×2 IMPLANT
SCREW LOCK RELINE 5.5 TULIP (Screw) ×16 IMPLANT
SCREW MAS RELINE 6.5X45 POLY (Screw) ×8 IMPLANT
SCREW PA RELINE MAS 5.5X50 C2 (Screw) ×2 IMPLANT
SCREW RELINE MAS POLY 5.5X45MM (Screw) ×6 IMPLANT
SPONGE SURGIFOAM ABS GEL 100 (HEMOSTASIS) IMPLANT
STAPLER SKIN PROX WIDE 3.9 (STAPLE) IMPLANT
SUT VIC AB 1 CT1 18XBRD ANBCTR (SUTURE) ×2 IMPLANT
SUT VIC AB 1 CT1 8-18 (SUTURE) ×3
SUT VIC AB 2-0 CT1 18 (SUTURE) ×4 IMPLANT
SUT VIC AB 3-0 SH 8-18 (SUTURE) ×6 IMPLANT
TOWEL GREEN STERILE (TOWEL DISPOSABLE) ×3 IMPLANT
TOWEL GREEN STERILE FF (TOWEL DISPOSABLE) ×3 IMPLANT
TRAY FOLEY MTR SLVR 16FR STAT (SET/KITS/TRAYS/PACK) ×3 IMPLANT
WATER STERILE IRR 1000ML POUR (IV SOLUTION) ×3 IMPLANT

## 2021-05-25 NOTE — Brief Op Note (Signed)
05/24/2021 - 05/25/2021  9:27 PM  PATIENT:  Dylan Shaw  55 y.o. 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   PROCEDURE:  Procedure(s): PLACEMENT OF PERCUTANEOUS PEDICLE SCREWS T 10 - L 2 AND RESECTION OF THORACIC SPINE MASS with partial vertebrectomy and laminectomy with resection of epidural metastasis and decompression of thoracic spinal cord(N/A)  SURGEON:  Surgeon(s) and Role:    Erline Levine, MD - Primary  PHYSICIAN ASSISTANT: Glenford Peers, NP  ASSISTANTS: none   ANESTHESIA:   general  EBL:  200 mL   BLOOD ADMINISTERED:none  DRAINS: none   LOCAL MEDICATIONS USED:  MARCAINE    and LIDOCAINE   SPECIMEN:  Excision  DISPOSITION OF SPECIMEN:  PATHOLOGY  COUNTS:  YES  TOURNIQUET:  * No tourniquets in log *  DICTATION: Patient has spinal cord compression at T12 due to metastatic cancer of unknown primary (likely lung) with urinary and fecal incontinence and progressively worsening back pain. In addition, he had some ventral compression of spinal cord with 50 % height loss vertebral fracture.  It was elected to take him to surgery for T12 laminectomy and resection of epidural metastasis with partial corpectomy and percutaneous pedicle screw fixation T 10, T 11, L 1 and L 2 levels.  Procedure: Patient was brought to the operating room and following the smooth and uncomplicated induction of general endotracheal anesthesia he was placed in a prone position on the [place] table.  His back was prepped and draped in the usual sterile fashion with betadine scrub and Duraprep after preoperative localizing radiographs with C arm were obtained. Area of planned incision was infiltrated with local lidocaine. Incision was made in the midline and carried to the lumbodorsal fascia which was  incised bilaterally. A large destructive mass was identified immediately below thoracic musculature along with destroyed and mobile posterior elements.The tumor was circumferentially defined and then removed with a Leksell rongeur and Kerrison rongeurs along with eroded posterior elements. A total laminectomy of T 12 was performed with leksell, and the spinal cord dura was defined. The tumor was then peeled away from the spinal cord dura which was widely decompressed. A large mass lateral to the cord on the right was identified and then removed.  The T 12 pedicle was removed on the left and the vertebral body was then incompletely resected and the bone and tumor ventral to the cord were tamped away and the spinal cord was further decompressed.  Hemostasis was obtained with Surgifoam.  At this point it was felt that all neural elements were well decompressed. Percutaneous pedicle screws were then placed using AP and lateral fluoroscopy as follows: T 10 and T 11 (6.5 x 45 mm screws) and L 1 and L 2 (5.5 x 45 mm screws except L 1 left which was 5.5 x 50 mm in length).  150 mm rod was placed on the left and 160 mm rod was placed on the right.  These were placed in thoracic kyphosis to maintain patient's spinal curvature.  Rods were locked down in situ and towers were disassembled and removed. The wounds were then irrigated with  saline. Hemostasis was assured with bipolar electrocautery and gelfoam, followed by Surgifoam. The lumbodorsal fascia was closed with 0 Vicryl sutures the subcutaneous tissues reapproximated 2-0 Vicryl inverted sutures and the skin edges were reapproximated with 3-0 Vicryl  subcuticular stitch. The wound is dressed with Dermabond and occlusive dressings.  Patient was extubated in the operating room and taken to recovery in stable and satisfactory condition having tolerated his operation well counts were correct at the end of the case.   PLAN OF CARE: Admit to inpatient   PATIENT DISPOSITION:   PACU - hemodynamically stable.   Delay start of Pharmacological VTE agent (>24hrs) due to surgical blood loss or risk of bleeding: yes

## 2021-05-25 NOTE — Anesthesia Postprocedure Evaluation (Signed)
Anesthesia Post Note  Patient: [name]  Procedure(s) Performed: PLACEMENT OF PERCUTANEOUS PEDICLE SCREWS , PARTIAL THORACIC TWELVE CORPECTOMY AND RESECTION OF THORACIC SPINE MASS (N/A Spine Thoracic)     Patient location during evaluation: PACU Anesthesia Type: General Level of consciousness: awake and alert Pain management: pain level controlled Vital Signs Assessment: post-procedure vital signs reviewed and stable Respiratory status: spontaneous breathing, nonlabored ventilation, respiratory function stable and patient connected to nasal cannula oxygen Cardiovascular status: blood pressure returned to baseline, stable and tachycardic Postop Assessment: no apparent nausea or vomiting Anesthetic complications: no   No complications documented.  Last Vitals:  Vitals:   05/25/21 2240 05/25/21 2255  BP: 100/69 93/77  Pulse: (!) 108 (!) 107  Resp: (!) 22 (!) 24  Temp:    SpO2: 98% 100%    Last Pain:  Vitals:   05/25/21 2255  TempSrc:   PainSc: 0-No pain    LLE Motor Response: Purposeful movement (05/25/21 2255) LLE Sensation: Full sensation (05/25/21 2255) RLE Motor Response: Purposeful movement (05/25/21 2255) RLE Sensation: Full sensation (05/25/21 2255)      Dylan Shaw

## 2021-05-25 NOTE — Anesthesia Procedure Notes (Signed)
Procedure Name: Intubation Date/Time: 05/25/2021 6:24 PM Performed by: Jearld Pies, CRNA Pre-anesthesia Checklist: Patient identified, Emergency Drugs available, Suction available and Patient being monitored Patient Re-evaluated:Patient Re-evaluated prior to induction Oxygen Delivery Method: Circle System Utilized Preoxygenation: Pre-oxygenation with 100% oxygen Induction Type: IV induction Ventilation: Mask ventilation without difficulty Laryngoscope Size: Mac and 4 Grade View: Grade I Tube type: Oral Tube size: 7.5 mm Number of attempts: 1 Airway Equipment and Method: Stylet and Oral airway Placement Confirmation: ETT inserted through vocal cords under direct vision,  positive ETCO2 and breath sounds checked- equal and bilateral Secured at: 21 cm Tube secured with: Tape Dental Injury: Teeth and Oropharynx as per pre-operative assessment
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
## 2021-05-25 NOTE — Op Note (Signed)
05/24/2021 - 05/25/2021  9:27 PM  PATIENT:  Dylan Shaw  55 y.o. 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   PROCEDURE:  Procedure(s): PLACEMENT OF PERCUTANEOUS PEDICLE SCREWS T 10 - L 2 AND RESECTION OF THORACIC SPINE MASS with partial vertebrectomy and laminectomy with resection of epidural metastasis and decompression of thoracic spinal cord(N/A)  SURGEON:  Surgeon(s) and Role:    Erline Levine, MD - Primary  PHYSICIAN ASSISTANT: Glenford Peers, NP  ASSISTANTS: none   ANESTHESIA:   general  EBL:  200 mL   BLOOD ADMINISTERED:none  DRAINS: none   LOCAL MEDICATIONS USED:  MARCAINE    and LIDOCAINE   SPECIMEN:  Excision  DISPOSITION OF SPECIMEN:  PATHOLOGY  COUNTS:  YES  TOURNIQUET:  * No tourniquets in log *  DICTATION: Patient has spinal cord compression at T12 due to metastatic cancer of unknown primary (likely lung) with urinary and fecal incontinence and progressively worsening back pain. In addition, he had some ventral compression of spinal cord with 50 % height loss vertebral fracture.  It was elected to take him to surgery for T12 laminectomy and resection of epidural metastasis with partial corpectomy and percutaneous pedicle screw fixation T 10, T 11, L 1 and L 2 levels.  Procedure: Patient was brought to the operating room and following the smooth and uncomplicated induction of general endotracheal anesthesia he was placed in a prone position on the Harvey table.  His back was prepped and draped in the usual sterile fashion with betadine scrub and Duraprep after preoperative localizing radiographs with C arm were obtained. Area of planned incision was infiltrated with local lidocaine. Incision was made in the midline and carried to the lumbodorsal fascia which was  incised bilaterally. A large destructive mass was identified immediately below thoracic musculature along with destroyed and mobile posterior elements.The tumor was circumferentially defined and then removed with a Leksell rongeur and Kerrison rongeurs along with eroded posterior elements. A total laminectomy of T 12 was performed with leksell, and the spinal cord dura was defined. The tumor was then peeled away from the spinal cord dura which was widely decompressed. A large mass lateral to the cord on the right was identified and then removed.  The T 12 pedicle was removed on the left and the vertebral body was then incompletely resected and the bone and tumor ventral to the cord were tamped away and the spinal cord was further decompressed.  Hemostasis was obtained with Surgifoam.  At this point it was felt that all neural elements were well decompressed. Percutaneous pedicle screws were then placed using AP and lateral fluoroscopy as follows: T 10 and T 11 (6.5 x 45 mm screws) and L 1 and L 2 (5.5 x 45 mm screws except L 1 left which was 5.5 x 50 mm in length).  150 mm rod was placed on the left and 160 mm rod was placed on the right.  These were placed in thoracic kyphosis to maintain patient's spinal curvature.  Rods were locked down in situ and towers were disassembled and removed. The wounds were then irrigated with  saline. Hemostasis was assured with bipolar electrocautery and gelfoam, followed by Surgifoam. The lumbodorsal fascia was closed with 0 Vicryl sutures the subcutaneous tissues reapproximated 2-0 Vicryl inverted sutures and the skin edges were reapproximated with 3-0 Vicryl  subcuticular stitch. The wound is dressed with Dermabond and occlusive dressings.  Patient was extubated in the operating room and taken to recovery in stable and satisfactory condition having tolerated his operation well counts were correct at the end of the case.   PLAN OF CARE: Admit to inpatient   PATIENT DISPOSITION:   PACU - hemodynamically stable.   Delay start of Pharmacological VTE agent (>24hrs) due to surgical blood loss or risk of bleeding: yes

## 2021-05-25 NOTE — Transfer of Care (Signed)
Immediate Anesthesia Transfer of Care Note  Patient: Dylan Shaw  Procedure(s) Performed: PLACEMENT OF PERCUTANEOUS PEDICLE SCREWS , PARTIAL THORACIC TWELVE CORPECTOMY AND RESECTION OF THORACIC SPINE MASS (N/A Spine Thoracic)  Patient Location: PACU  Anesthesia Type:General  Level of Consciousness: drowsy, oral airway placed  Airway & Oxygen Therapy: Patient Spontanous Breathing and Patient connected to face mask oxygen  Post-op Assessment: Report given to RN and Post -op Vital signs reviewed and stable  Post vital signs: Reviewed and stable  Last Vitals:  Vitals Value Taken Time  BP 96/65 05/25/21 2155  Temp    Pulse 96 05/25/21 2158  Resp 16 05/25/21 2158  SpO2 100 % 05/25/21 2158  Vitals shown include unvalidated device data.  Last Pain:  Vitals:   05/25/21 1737  TempSrc:   PainSc: 0-No pain         Complications: No complications documented.

## 2021-05-25 NOTE — Consult Note (Signed)
NAME:  Dylan Shaw, MRN:  [id number], DOB:  03-Feb-1966, LOS: 1 ADMISSION DATE:  05/24/2021, CONSULTATION DATE:  05/25/21 REFERRING MD:  Domenic Polite, MD CHIEF COMPLAINT:  Lung mass   History of Present Illness:  Dylan Shaw is a 55 year old male, daily smoker who presented on 5/27 with weight loss, fatigue and low back pain.  Initial work-up with CT chest/abd/pelvis showed 5.4 cm right lower lobe lung mass with nodules, hilar and mediastinal lymphadenopathy, metastatic disease to the abdominal cavity and MRI of the T-spine showing a soft tissue mass at T12 with osseous destruction and concern for epidural involvement.  PCCM has been consulted for tissue diagnosis involving bronchoscopy.  Pertinent  Medical History  Tobacco Use  Significant Hospital Events: Including procedures, antibiotic start and stop dates in addition to other pertinent events   . 5/27 admitted with findings of metastatic disease concerning for primary lung cancer . 5/28 developed incontinence, transferred to [facility] for neurosurgery  Interim History / Subjective:   Patient without breathing issues at this time. Denies hemoptysis, cough or wheezing.   Objective   Blood pressure 127/90, pulse (!) 101, temperature 98 F (36.7 C), temperature source Oral, resp. rate 16, height 5\' 7"  (1.702 m), weight 46.1 kg, SpO2 94 %.        Intake/Output Summary (Last 24 hours) at 05/25/2021 1405 Last data filed at 05/25/2021 1141 Gross per 24 hour  Intake 1588 ml  Output --  Net 1588 ml   Filed Weights   05/24/21 1118 05/24/21 2208 05/25/21 0601  Weight: 59 kg 46.1 kg 46.1 kg    Examination: General: cachectic male, no acute distress HENT: [place]/AT, sclera anicteric, moist mucous membranes Lungs: clear to auscultation bilaterally.  No wheezing or rhonchi. Cardiovascular: Regular rate and rhythm, no murmurs. Abdomen: Soft, nontender, nondistended, bowel sounds present. Extremities: Warm, no edema Neuro: No focal  deficits. GU: Deferred  Labs/imaging that I have personally reviewed  (right click and "Reselect all SmartList Selections" daily)  CT Chest: 5.4 cm lobular pleural-based mass right lower lobe superior segment.  Hilar and mediastinal adenopathy.  9.5 cm complex lytic lesion involving T12 vertebral body.  CT abdomen: 1 cm lytic lesion involving the medial aspect of the right ilium.  5.1 cm lytic lesion involving the left pubic bone.  0.7 cm lytic lesion involving the anterior margin of the ileum.  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  - Hopefully tissue diagnosis can be made from the decompression surgery via neurosurgery - PCCM will continue to follow incase tissue diagnosis is still needed and we can perform EBUS.  - MRI brain w/wo contrast ordered for staging purposes  Labs   CBC: Recent Labs  Lab 05/24/21 1152  WBC 9.0  NEUTROABS 6.9  HGB 12.7*  HCT 38.5*  MCV 87.5  PLT 253    Basic Metabolic Panel: Recent Labs  Lab 05/24/21 1152  NA 132*  K 3.9  CL 92*  CO2 30  GLUCOSE 127*  BUN 16  CREATININE 0.80  CALCIUM 9.3   GFR: Estimated Creatinine Clearance: 69.6 mL/min (by C-G formula based on SCr of 0.8 mg/dL). Recent Labs  Lab 05/24/21 1152  WBC 9.0    Liver  Function Tests: Recent Labs  Lab 05/24/21 1152  AST 15  ALT 11  ALKPHOS 92  BILITOT 0.9  PROT 7.7  ALBUMIN 2.7*   No results for input(s): LIPASE, AMYLASE in the last 168 hours. No results for input(s): AMMONIA in the last 168 hours.  ABG No results found for: PHART, PCO2ART, PO2ART, HCO3, TCO2, ACIDBASEDEF, O2SAT   Coagulation Profile: No results for input(s): INR, PROTIME in the last 168  hours.  Cardiac Enzymes: No results for input(s): CKTOTAL, CKMB, CKMBINDEX, TROPONINI in the last 168 hours.  HbA1C: No results found for: HGBA1C  CBG: No results for input(s): GLUCAP in the last 168 hours.  Review of Systems:   A 10 point review of systems was performed and is otherwise negative unless stated in the HPI.  Past Medical History:  He,  has no past medical history on file.   Surgical History:   Past Surgical History:  Procedure Laterality Date  . LEG SURGERY       Social History:   reports that he has quit smoking. He has never used smokeless tobacco. He reports previous alcohol use. He reports that he does not use drugs.   Family History:  His family history is not on file.   Allergies No Known Allergies   Home Medications  Prior to Admission medications   Medication Sig Start Date End Date Taking? Authorizing Provider  cephALEXin (KEFLEX) 500 MG capsule Take 1 capsule (500 mg total) by mouth 4 (four) times daily. Patient not taking: Reported on 05/24/2021 10/05/20   Emerson Monte, FNP  predniSONE (STERAPRED UNI-PAK 21 TAB) 10 MG (21) TBPK tablet Take by mouth daily. Take 6 tabs by mouth daily  for 1 days, then 5 tabs for 1 days, then 4 tabs for 1 days, then 3 tabs for 1 days, 2 tabs for 1 days, then 1 tab by mouth daily for 1 days Patient not taking: Reported on 05/24/2021 10/05/20   Emerson Monte, FNP     Critical care time: N/A   Freda Jackson, MD [place] Office: [phone]   See Amion for personal pager PCCM on call pager [phone] until 7pm. Please call Elink 7p-7a. [phone]
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
## 2021-05-25 NOTE — Anesthesia Procedure Notes (Signed)
Arterial Line Insertion Start/End5/28/2022 6:15 PM, 05/25/2021 6:20 PM Performed by: Trinna Post., CRNA, CRNA  Patient location: OR. Preanesthetic checklist: patient identified, IV checked, site marked, risks and benefits discussed, surgical consent, monitors and equipment checked, pre-op evaluation, timeout performed and anesthesia consent Emergency situation Left, radial was placed Catheter size: 20 G Hand hygiene performed , maximum sterile barriers used  and Seldinger technique used Allen's test indicative of satisfactory collateral circulation Attempts: 1 Procedure performed without using ultrasound guided technique. Following insertion, Biopatch and dressing applied. Patient tolerated the procedure well with no immediate complications.
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
## 2021-05-25 NOTE — Progress Notes (Signed)
   05/25/21 1531  Assess: MEWS Score  Temp 97.8 F (36.6 C)  BP 124/89  Pulse Rate (!) 125  Resp 18  SpO2 92 %  Assess: MEWS Score  MEWS Temp 0  MEWS Systolic 0  MEWS Pulse 2  MEWS RR 0  MEWS LOC 0  MEWS Score 2  MEWS Score Color Yellow  Assess: if the MEWS score is Yellow or Red  Were vital signs taken at a resting state? Yes  Focused Assessment No change from prior assessment  Does the patient meet 2 or more of the SIRS criteria? No  MEWS guidelines implemented *See Row Information* Yes  Treat  MEWS Interventions Escalated (See documentation below)  Take Vital Signs  Increase Vital Sign Frequency  Yellow: Q 2hr X 2 then Q 4hr X 2, if remains yellow, continue Q 4hrs  Escalate  MEWS: Escalate Yellow: discuss with charge nurse/RN and consider discussing with provider and RRT  Notify: Provider  Provider Name/Title Domenic Polite, MD  Date Provider Notified 05/25/21  Time Provider Notified 1530  Notification Type Call  Notification Reason Other (Comment) (yelow mews)  Provider response No new orders  Date of Provider Response 05/25/21  Time of Provider Response 1530  Document  Progress note created (see row info) Yes  Assess: SIRS CRITERIA  SIRS Temperature  0  SIRS Pulse 1  SIRS Respirations  0  SIRS WBC 0  SIRS Score Sum  1  Patient with increase in HR and desaturation on room air at 85%. Pateint denies any pain. RN applied 2L of 02 via nasal cannula, patient saturation now 90-93%. MD P.Broadus John notified of this change, no new orders given at this time.

## 2021-05-26 ENCOUNTER — Inpatient Hospital Stay (HOSPITAL_COMMUNITY): Payer: 59

## 2021-05-26 DIAGNOSIS — E43 Unspecified severe protein-calorie malnutrition: Secondary | ICD-10-CM

## 2021-05-26 DIAGNOSIS — C7951 Secondary malignant neoplasm of bone: Secondary | ICD-10-CM

## 2021-05-26 DIAGNOSIS — C7931 Secondary malignant neoplasm of brain: Secondary | ICD-10-CM | POA: Diagnosis present

## 2021-05-26 DIAGNOSIS — D492 Neoplasm of unspecified behavior of bone, soft tissue, and skin: Secondary | ICD-10-CM

## 2021-05-26 DIAGNOSIS — G40209 Localization-related (focal) (partial) symptomatic epilepsy and epileptic syndromes with complex partial seizures, not intractable, without status epilepticus: Secondary | ICD-10-CM

## 2021-05-26 LAB — CBC
HCT: 27.4 % — ABNORMAL LOW (ref 39.0–52.0)
Hemoglobin: 8.8 g/dL — ABNORMAL LOW (ref 13.0–17.0)
MCH: 28.3 pg (ref 26.0–34.0)
MCHC: 32.1 g/dL (ref 30.0–36.0)
MCV: 88.1 fL (ref 80.0–100.0)
Platelets: 259 10*3/uL (ref 150–400)
RBC: 3.11 MIL/uL — ABNORMAL LOW (ref 4.22–5.81)
RDW: 13.7 % (ref 11.5–15.5)
WBC: 12.5 10*3/uL — ABNORMAL HIGH (ref 4.0–10.5)
nRBC: 0 % (ref 0.0–0.2)

## 2021-05-26 LAB — BASIC METABOLIC PANEL
Anion gap: 8 (ref 5–15)
BUN: 13 mg/dL (ref 6–20)
CO2: 28 mmol/L (ref 22–32)
Calcium: 8.8 mg/dL — ABNORMAL LOW (ref 8.9–10.3)
Chloride: 102 mmol/L (ref 98–111)
Creatinine, Ser: 0.71 mg/dL (ref 0.61–1.24)
GFR, Estimated: >60 mL/min (ref >60)
Glucose, Bld: 135 mg/dL — ABNORMAL HIGH (ref 70–99)
Potassium: 4.2 mmol/L (ref 3.5–5.1)
Sodium: 138 mmol/L (ref 135–145)

## 2021-05-26 LAB — PREALBUMIN: Prealbumin: 7.7 mg/dL — ABNORMAL LOW (ref 18–38)

## 2021-05-26 LAB — GLUCOSE, CAPILLARY: Glucose-Capillary: 122 mg/dL — ABNORMAL HIGH (ref 70–99)

## 2021-05-26 MED ORDER — ACETAMINOPHEN 325 MG PO TABS
650.0000 mg | ORAL_TABLET | ORAL | Status: DC | PRN
  Administered 2021-05-30 – 2021-06-26 (×20): 650 mg via ORAL
  Filled 2021-05-26 (×20): qty 2

## 2021-05-26 MED ORDER — METHOCARBAMOL 500 MG PO TABS
500.0000 mg | ORAL_TABLET | Freq: Four times a day (QID) | ORAL | Status: DC | PRN
  Administered 2021-05-30 – 2021-06-26 (×20): 500 mg via ORAL
  Filled 2021-05-26 (×21): qty 1

## 2021-05-26 MED ORDER — ONDANSETRON HCL 4 MG PO TABS
4.0000 mg | ORAL_TABLET | Freq: Four times a day (QID) | ORAL | Status: DC | PRN
  Administered 2021-06-05 – 2021-06-07 (×2): 4 mg via ORAL
  Filled 2021-05-26 (×3): qty 1

## 2021-05-26 MED ORDER — ACETAMINOPHEN 650 MG RE SUPP: 650.0000 mg | RECTAL | Status: DC | PRN

## 2021-05-26 MED ORDER — OXYCODONE HCL 5 MG PO TABS
5.0000 mg | ORAL_TABLET | ORAL | Status: DC | PRN
  Administered 2021-05-28 (×2): 5 mg via ORAL
  Filled 2021-05-26 (×2): qty 1

## 2021-05-26 MED ORDER — FENTANYL CITRATE (PF) 100 MCG/2ML IJ SOLN
INTRAMUSCULAR | Status: AC
  Filled 2021-05-26: qty 2

## 2021-05-26 MED ORDER — ZOLPIDEM TARTRATE 5 MG PO TABS: 5.0000 mg | ORAL_TABLET | Freq: Every evening | ORAL | Status: DC | PRN

## 2021-05-26 MED ORDER — POLYETHYLENE GLYCOL 3350 17 G PO PACK
17.0000 g | PACK | Freq: Every day | ORAL | Status: DC | PRN
  Administered 2021-05-26 – 2021-06-07 (×4): 17 g via ORAL
  Filled 2021-05-26 (×5): qty 1

## 2021-05-26 MED ORDER — SODIUM CHLORIDE 0.9 % IV SOLN: INTRAVENOUS | Status: DC

## 2021-05-26 MED ORDER — HYDROCODONE-ACETAMINOPHEN 5-325 MG PO TABS
2.0000 | ORAL_TABLET | ORAL | Status: DC | PRN
  Filled 2021-05-26: qty 2

## 2021-05-26 MED ORDER — SODIUM CHLORIDE 0.9% FLUSH: 3.0000 mL | INTRAVENOUS | Status: DC | PRN

## 2021-05-26 MED ORDER — KCL IN DEXTROSE-NACL 20-5-0.45 MEQ/L-%-% IV SOLN
INTRAVENOUS | Status: DC
  Filled 2021-05-26 (×3): qty 1000

## 2021-05-26 MED ORDER — ALUM & MAG HYDROXIDE-SIMETH 200-200-20 MG/5ML PO SUSP: 30.0000 mL | Freq: Four times a day (QID) | ORAL | Status: DC | PRN

## 2021-05-26 MED ORDER — BISACODYL 10 MG RE SUPP
10.0000 mg | Freq: Every day | RECTAL | Status: DC | PRN
  Filled 2021-05-26: qty 1

## 2021-05-26 MED ORDER — CEFAZOLIN SODIUM-DEXTROSE 2-4 GM/100ML-% IV SOLN
2.0000 g | Freq: Three times a day (TID) | INTRAVENOUS | Status: AC
  Administered 2021-05-26 (×2): 2 g via INTRAVENOUS
  Filled 2021-05-26 (×2): qty 100

## 2021-05-26 MED ORDER — DEXAMETHASONE SODIUM PHOSPHATE 4 MG/ML IJ SOLN
4.0000 mg | Freq: Four times a day (QID) | INTRAMUSCULAR | Status: DC
  Administered 2021-05-26 – 2021-06-04 (×11): 4 mg via INTRAVENOUS
  Filled 2021-05-26 (×17): qty 1

## 2021-05-26 MED ORDER — METHOCARBAMOL 1000 MG/10ML IJ SOLN
500.0000 mg | Freq: Four times a day (QID) | INTRAMUSCULAR | Status: DC | PRN
  Filled 2021-05-26: qty 5

## 2021-05-26 MED ORDER — PANTOPRAZOLE SODIUM 40 MG PO TBEC
40.0000 mg | DELAYED_RELEASE_TABLET | Freq: Every day | ORAL | Status: DC
  Administered 2021-05-26 – 2021-06-25 (×31): 40 mg via ORAL
  Filled 2021-05-26 (×31): qty 1

## 2021-05-26 MED ORDER — PHENOL 1.4 % MT LIQD
1.0000 | OROMUCOSAL | Status: DC | PRN
  Filled 2021-05-26: qty 177

## 2021-05-26 MED ORDER — MENTHOL 3 MG MT LOZG: 1.0000 | LOZENGE | OROMUCOSAL | Status: DC | PRN

## 2021-05-26 MED ORDER — SODIUM CHLORIDE 0.9 % IV SOLN: 250.0000 mL | INTRAVENOUS | Status: DC

## 2021-05-26 MED ORDER — DEXAMETHASONE 4 MG PO TABS
4.0000 mg | ORAL_TABLET | Freq: Four times a day (QID) | ORAL | Status: DC
  Administered 2021-05-26 – 2021-06-05 (×29): 4 mg via ORAL
  Filled 2021-05-26 (×28): qty 1

## 2021-05-26 MED ORDER — FLEET ENEMA 7-19 GM/118ML RE ENEM: 1.0000 | ENEMA | Freq: Once | RECTAL | Status: DC | PRN

## 2021-05-26 MED ORDER — GADOBUTROL 1 MMOL/ML IV SOLN
5.0000 mL | Freq: Once | INTRAVENOUS | Status: AC | PRN
  Administered 2021-05-26: 5 mL via INTRAVENOUS

## 2021-05-26 MED ORDER — DOCUSATE SODIUM 100 MG PO CAPS
100.0000 mg | ORAL_CAPSULE | Freq: Two times a day (BID) | ORAL | Status: DC
  Administered 2021-05-26 – 2021-06-26 (×62): 100 mg via ORAL
  Filled 2021-05-26 (×63): qty 1

## 2021-05-26 MED ORDER — KETOROLAC TROMETHAMINE 15 MG/ML IJ SOLN
15.0000 mg | Freq: Four times a day (QID) | INTRAMUSCULAR | Status: AC
  Administered 2021-05-26 (×4): 15 mg via INTRAVENOUS
  Filled 2021-05-26 (×4): qty 1

## 2021-05-26 MED ORDER — SODIUM CHLORIDE 0.9% FLUSH
3.0000 mL | Freq: Two times a day (BID) | INTRAVENOUS | Status: DC
  Administered 2021-05-26 – 2021-06-04 (×16): 3 mL via INTRAVENOUS

## 2021-05-26 MED ORDER — ONDANSETRON HCL 4 MG/2ML IJ SOLN: 4.0000 mg | Freq: Four times a day (QID) | INTRAMUSCULAR | Status: DC | PRN

## 2021-05-26 MED ORDER — HYDROMORPHONE HCL 1 MG/ML IJ SOLN
0.5000 mg | INTRAMUSCULAR | Status: DC | PRN
  Administered 2021-05-26: 0.5 mg via INTRAVENOUS
  Filled 2021-05-26: qty 1

## 2021-05-26 MED ORDER — PANTOPRAZOLE SODIUM 40 MG IV SOLR: 40.0000 mg | Freq: Every day | INTRAVENOUS | Status: DC

## 2021-05-26 NOTE — Procedures (Signed)
Routine EEG Report  Dylan Shaw is a 55 y.o. male with a history of metastatic cancer to the brain who is undergoing an EEG to evaluate for seizures.  Report: This EEG was acquired with electrodes placed according to the International 10-20 electrode system (including Fp1, Fp2, F3, F4, C3, C4, P3, P4, O1, O2, T3, T4, T5, T6, A1, A2, Fz, Cz, Pz). The following electrodes were missing or displaced: none.  The occipital dominant rhythm was 7-8 Hz. This activity is reactive to stimulation. Drowsiness was manifested by background fragmentation; deeper stages of sleep were not identified. There was focal slowing over the left frontal > right frontal regions. There were no interictal epileptiform discharges. There were no electrographic seizures identified. There was no abnormal response to photic stimulation or hyperventilation.   Impression and clinical correlation: This EEG was obtained while awake and drowsy and is abnormal due to mild diffuse slowing indicating mild global cerebral dysfunction and focal slowing over the left>right frontal regions indicating superimposed focal cerebral dysfunction in those regions.   Su Monks, MD [facility] [phone]  If 7pm- 7am, please page neurology on call as listed in [place].
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
## 2021-05-26 NOTE — Progress Notes (Addendum)
Initial Nutrition Assessment  DOCUMENTATION CODES:   Underweight (Suspect PCM)  INTERVENTION:    Ensure Enlive po TID, each supplement provides 350 kcal and 20 grams of protein  MVI daily   NUTRITION DIAGNOSIS:   Increased nutrient needs related to post-op healing as evidenced by estimated needs.  GOAL:   Patient will meet greater than or equal to 90% of their needs  MONITOR:   PO intake,Supplement acceptance,Weight trends,Labs,I & O's  REASON FOR ASSESSMENT:   Consult Assessment of nutrition requirement/status  ASSESSMENT:   Patient with PMH significant for tobacco use. Presents this admission with T12 metastatic lesion with epidural extension (presumed lung primary).  5/28- s/p placement of pedicle screws T 10-L2 and resection of thoracic spine mass  Attempted to call patient x2. No answer. Last meal completion charted as 100%. Continue Ensure supplements as ordered for now. Will attempted to obtain nutrition/weight history at later date if able.   Weight history limited in chart. Per H&P patient has experienced weight loss of unknown amount. Suspect patient is malnourished but unable to diagnose without NFPE or detailed history.   Drips: D5 and 1/2 NS with 20 mEq KCl @ 75 ml/hr  Medications: decadron, colace Labs: reviewed   Diet Order:   Diet Order            Diet regular Room service appropriate? Yes; Fluid consistency: Thin  Diet effective now                 EDUCATION NEEDS:   Education needs have been addressed  Skin:  Skin Assessment: Skin Integrity Issues: Skin Integrity Issues:: Incisions Incisions: back  Last BM:  PTA  Height:   Ht Readings from Last 1 Encounters:  05/25/21 5\' 7"  (1.702 m)    Weight:   Wt Readings from Last 1 Encounters:  05/25/21 46.1 kg   BMI:  Body mass index is 15.92 kg/m.  Estimated Nutritional Needs:   Kcal:  1500-1700 kcal  Protein:  75-90 grams  Fluid:  >/= 1.5 L/day  Mariana Single RD,  LDN Clinical Nutrition Pager listed in [place]

## 2021-05-26 NOTE — Progress Notes (Signed)
Mr. Mejorado was working with physical therapy and had an onset of expressive aphasia. According to his RN, he was normal at 1030 when she came in to deliver his Ensure beverage. Code stroke was called at 71. Neurology and rapid was at the bedside at 1115. Patient arrived to CT at 1136. NIHSS was 4. CT of head revealed metastasis. Code stroke cancelled 1149.

## 2021-05-26 NOTE — Consult Note (Signed)
Neurology consult   CC: code stroke on an inpatient.  History is obtained from: RN, PT, chart.  HPI: Mr. Wuertz is a 55 yo male with a PMHx of lung cancer with bone metastasis, tobacco abuse who presented to [facility] on 05/24/21 with complaints of weakness, fatigue, SOB and 50 lb weight loss over prior 6 weeks, and back pain. MRI spine revealed large/destructive osseous metastasis at T12. NSU took patient to OR 05/25/21.   This a.m. when PT was working with patient, he acutely had onset of expressive aphasia around 92. He was previously seen by RN around 1030 and patient did not seem to have any speech problems then. Patient states he woke up feeling bad, but did not feel like his speech had changed.   Code stroke was called by RN. NP/MD immediately to room. Patient demonstrated expressive aphasia predominately, with mild receptive aphasia. Noted right facial droop and slight right arm drift.   LKW: 1030   hours tpa given?: No s/p surgery 05/25/21.  IR Thrombectomy?: No, do not suspect LVO after CTH finding of metastasis.  MRS 0 prior to hospitalization.   NIHSS:  1a Level of Consciousness: 0 1b LOC Questions: 0 1c LOC Commands: 0 2 Best Gaze: 0 3 Visual: 0 4 Facial Palsy: 1 5a Motor Arm - left: 0 5b Motor Arm - Right: 1 6a Motor Leg - Left: 0 6b Motor Leg - Right: 0 7 Limb Ataxia: 1 8 Sensory: 0 9 Best Language: 1 10 Dysarthria: 0 11 Extinction and Inattention: 0 TOTAL:  4  ROS: A robust ROS was unable to be performed due to emergent nature of the event.   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   FMHx unknown.   Social History:  reports that he has quit smoking. He has never used smokeless tobacco. He reports previous alcohol use. He reports that he does not use drugs. 1 PPD x 50 years. 50 pack years.    Prior to Admission medications   Medication Sig Start Date End Date Taking? Authorizing Provider  cephALEXin (KEFLEX) 500 MG capsule Take 1 capsule (500 mg  total) by mouth 4 (four) times daily. Patient not taking: Reported on 05/24/2021 10/05/20   Emerson Monte, FNP  predniSONE (STERAPRED UNI-PAK 21 TAB) 10 MG (21) TBPK tablet Take by mouth daily. Take 6 tabs by mouth daily  for 1 days, then 5 tabs for 1 days, then 4 tabs for 1 days, then 3 tabs for 1 days, 2 tabs for 1 days, then 1 tab by mouth daily for 1 days Patient not taking: Reported on 05/24/2021 10/05/20   Emerson Monte, FNP    Exam: Current vital signs: BP 94/68   Pulse 96   Temp 97.9 F (36.6 C) (Oral)   Resp 14   Ht 5\' 7"  (1.702 m)   Wt 46.1 kg   SpO2 98%   BMI 15.92 kg/m   Physical Exam  Constitutional: Very slender and frail appearing, almost cachetic.  Psych: Affect appropriate to situation. Is frustrated when trying to speak.  Eyes: No scleral injection. HENT: No OP obstrucion. Head: Normocephalic.  Cardiovascular: Normal rate and regular rhythm.  Respiratory: Effort normal.  Skin: WDI.  Neuro: Mental Status: Patient is awake, alert, oriented to person, age, place.  Patient is unable to give a clear and coherent history. No signs of neglect. Speech/Language:  Speech when he can speak is clear, no dysarthria. + expressive aphasia. Can not name objects, but repeats  one phase. Comprehension is slowed.   intact.  Cranial Nerves: II: Visual Fields are full. Pupils are equal, round, and reactive to light.  III,IV, VI: EOMI without ptosis or diploplia.  V: Facial sensation is symmetric to light touch in V1, V2, and V3 VII: right facial droop noted.   VIII: hearing is intact to voice. X: Uvula elevates symmetrically. XI: Shoulder shrug is symmetric. XII: tongue is midline without atrophy or fasciculations.  Motor: RUE:  grips 5 /5     biceps  5/5     triceps  5/5 LUE:  grips  5 /5    biceps  5/5     triceps  5/5 RLE/LLE: moves legs purposefully. No drift.  Tone is normal. Bulk is decreased.  Sensory: Sensation is symmetric to light touch in all fours  extremities. Extinction absent to light touch DSS.  Plantars: Toes are downgoing bilaterally.  Cerebellar: Ataxia noted with FNF.  MD reviewed the images obtained:  NCT head  At least 4 large brain metastases measuring up to 5 cm in the left frontal lobe. Associated vasogenic edema and regional mass effect.  Assessment: 55 yo male admitted 2 days ago and found to have spinal metastasis with known primary lung cancer. This am, patient began to have difficulty getting his words out during PT session. Code stroke was called. His sudden onset of aphasia is consistent with the vasogenic edema and mass effect found secondary to newly found brain metastasis. Seizure must be considered due to acute onset of symptoms as well.   Impression:  1. Primary lung cancer with spinal metastasis and newly found brain metastasis today  2. Vasogenic edema with mass effect.    Plan: - MRI brain without contrast. -continue Prednisone as ordered by NSU.  -Telemetry monitoring for arrhythmia. - continue PT/OT.  - frequent neuro checks.  - rEEG to eval for any epileptogenic discharges. -Recommend palliative care consult.    Patient seen by MD/NP. Electronically signed by: Clance Boll, MSN, APN-BC, nurse practitioner and by MD. Note/plan to be edited by MD as needed.  Pager: [phone]

## 2021-05-26 NOTE — Progress Notes (Signed)
Order placed for regular diet after speaking with Dr. Leonel Ramsay, who said that patient could be placed back on previous diet prior to CT scan. CT scan negative for stroke.

## 2021-05-26 NOTE — Progress Notes (Signed)
EEG complete - results pending 

## 2021-05-26 NOTE — Progress Notes (Signed)
Spoke with Dr Lonny Prude today - patient is s/p surgical decompression of spine and developed aphasia during PT, prompting CT head.  I reviewed patient's CT, notable for multiple (at least 4) bulky brain metastases. 3T MRI pending.  He will continue decadron; team is determining if antiseizure meds are appropriate (not clear if he had stroke vs seizure, EEG pending)  Mont Dutton notified to navigate patient for RT planning on 05-28-21 depending on his medical stability when our dept reopens Tuesday.  Technique of brain RT will depend on pathology and MRI results.  Once adequately healed from spine surgery he will also be a candidate for post op spine RT. -----------------------------------  Eppie Gibson, MD

## 2021-05-26 NOTE — Progress Notes (Signed)
Neurosurgery Service Progress Note  Subjective: Had some aphasia this morning while working w/ PT, likely seizure   Objective: Vitals:   05/26/21 0055 05/26/21 0105 05/26/21 0825 05/26/21 1127  BP: 93/72  96/61 94/68  Pulse: 96 91 96 75  Resp:  (!) 22 14 16   Temp:   97.9 F (36.6 C) 97.8 F (36.6 C)  TempSrc:   Oral Oral  SpO2: 100% 100% 98% 98%  Weight:      Height:        Physical Exam: AOx3, PERRL, EOMI, FS, TM, speech fluent with normal content, Strength 5/5 x4, pt reports normal sensation on exam, +bilateral ankle clonus and hyperreflexia, no hoffman's  Assessment & Plan: 55 y.o. man w/ new metastatic disease s/p thoracolumbar decompression and fusion. Now w/ expressive aphasia, self-limited, likely focal seizure.  -already on dex 4q6 -MRI brain pending -neurology consulted, EEG pending, presume they're waiting to treat with AEDs until EEG done, will defer management to them regarding seizure workup -CTH showed 4 large lesions, largest is 6cm, will have to be addressed depending on final path, etc -okay for subQ heparin tomorrow  Judith Part  05/26/21 12:57 PM

## 2021-05-26 NOTE — Progress Notes (Signed)
PT Cancellation Note  Patient Details Name: Dylan Shaw MRN: [id number] DOB: 10-09-1966   Cancelled Treatment:    Reason Eval/Treat Not Completed: Medical issues which prohibited therapy. Upon entry, pt was having difficulty finding words and stuttering. Pt appeared frustrated with this and was perseverating on his birthday. Pt stated this began a couple hours prior. PT immediately notified RN and called Code Stroke. Will follow-up as time permits and as appropriate.   Moishe Spice, PT, DPT Acute Rehabilitation Services  Pager: [phone] Office: [place] 05/26/2021, 11:20 AM

## 2021-05-26 NOTE — Progress Notes (Signed)
OT Cancellation Note  Patient Details Name: Dylan Shaw MRN: [id number] DOB: Dec 16, 1966   Cancelled Treatment:    Reason Eval/Treat Not Completed: Medical issues which prohibited therapy;Patient at procedure or test/ unavailable.  Attempted to see pt x 2 today.  On first attempt pt had just had a stroke code called with PT,and was being worked up, and on second attempt, pt was undergoing EEG.  Will reattempt.  Nilsa Nutting., OTR/L Acute Rehabilitation Services Pager [phone] Office [phone]   Lucille Passy M 05/26/2021, 4:11 PM

## 2021-05-26 NOTE — Progress Notes (Signed)
Received report from Anice Paganini, RN.  Was told that code stroke orders were no longer valid per her conversation with Dr. Lonny Prude.  Review of notes states code stroke cancelled today at 11:49 per Rapid Response notes.

## 2021-05-27 MED ORDER — HEPARIN SODIUM (PORCINE) 5000 UNIT/ML IJ SOLN
5000.0000 [IU] | Freq: Three times a day (TID) | INTRAMUSCULAR | Status: DC
  Administered 2021-05-27 – 2021-06-28 (×61): 5000 [IU] via SUBCUTANEOUS
  Filled 2021-05-27 (×70): qty 1

## 2021-05-27 MED ORDER — LEVETIRACETAM IN NACL 500 MG/100ML IV SOLN
500.0000 mg | Freq: Two times a day (BID) | INTRAVENOUS | Status: DC
  Administered 2021-05-27 (×2): 500 mg via INTRAVENOUS
  Filled 2021-05-27 (×3): qty 100

## 2021-05-27 NOTE — Evaluation (Addendum)
Occupational Therapy Evaluation Patient Details Name: Dylan Shaw MRN: [id number] DOB: 05/31/1966 Today's Date: 05/27/2021    History of Present Illness 55 y.o. man w/ new metastatic disease s/p thoracolumbar decompression and fusion. Now w/ expressive aphasia 5/29 , self-limited, likely focal seizure. PMH met CA with primary site Lungs, smoker, 50 pound weight loss   Clinical Impression   Patient is s/p PEDICLE SCREWS T 10 - L 2 AND RESECTION OF THORACIC SPINE MASS with partial vertebrectomy and laminectomy with resection of epidural metastasis and decompression of thoracic spinal cordsurgery resulting in functional limitations due to the deficits listed below (see OT problem list). Pt currently demonstrates cognitive deficits with concern for return home alone. Pt asked to name as many animals in 1 minute as possible pt repeating dog and cat multiple times. Pt given cue "if you are at the zoo what animal would you see" pt only able to respond chimp. Pt demonstrates visual deficits with reading handout. Pt lacks awareness to deficits. Pt asked to repeat no bending lifting or twisting. Pt unable to recall all 3 after multiple attempts during session. Pt with poor recall and carry over of information. Pt reports agreeable to contacting brother Herbie [place]. Pt indep per patient report prior to admission with brother caring for dog "bear". Pt unable to give details about type of dog bear is and becomes tearful. Pt liable several times during session spontaneously and when questioned states he is uncertain why he is upset.  Patient will benefit from skilled OT acutely to increase independence and safety with ADLS to allow discharge SNf ( until confirmed family can (A) and reports brother works full time.  Attempting to call brother Herbie [place] with no voicemail or answer     Follow Up Recommendations  SNF    Equipment Recommendations  3 in 1 bedside commode    Recommendations for Other Services Speech  consult     Precautions / Restrictions Precautions Precautions: Back Precaution Booklet Issued: Yes (comment) Precaution Comments: adls reviewed with back precautions, handout in room Required Braces or Orthoses:     Mobility Bed Mobility Overal bed mobility: Needs Assistance Bed Mobility: Supine to Sit;Rolling Rolling: Supervision   Supine to sit: Min guard     General bed mobility comments: mod cues for sequence and back precautions but able to power up with cues    Transfers Overall transfer level: Needs assistance Equipment used: Rolling walker (2 wheeled) Transfers: Sit to/from Stand Sit to Stand: Min guard         General transfer comment: pt able to power up into standing with RW. pt needs mod cues for placement of RW for safety    Balance Overall balance assessment: Needs assistance Sitting-balance support: Bilateral upper extremity supported;Feet supported Sitting balance-Leahy Scale: Fair     Standing balance support: Single extremity supported;During functional activity Standing balance-Leahy Scale: Fair                             ADL either performed or assessed with clinical judgement   ADL Overall ADL's : Needs assistance/impaired Eating/Feeding: Set up;Sitting   Grooming: Oral care;Set up;Standing Grooming Details (indicate cue type and reason): requries UE support             Lower Body Dressing: Min guard;Bed level Lower Body Dressing Details (indicate cue type and reason): able to figure 4 cross and don socks Toilet Transfer: Minimal assistance;Ambulation;RW Toilet Transfer Details (indicate cue type and  reason): simulated to chair         Functional mobility during ADLs: Minimal assistance;Rolling walker General ADL Comments: pt provided RW initially for stability with initial transfer after surgery/ pain management.  Pt aware of surgery but unable to describe it in any details     Vision Baseline Vision/History: No  visual deficits Patient Visual Report: No change from baseline Additional Comments: pt unable to read handout. Pt reports "just cant see it. its been going on awhile when asked" pt able to read calendar on wall 30 and mAy but when asked to read Monday states "may" pt reports it says May twice. pt scanning in all quadrants and able to locate adl item on sink surface. Question baseline vision vs new visual changes- pt inconsistent in his response regarding vision     Perception     Praxis      Pertinent Vitals/Pain Pain Assessment: No/denies pain     Hand Dominance Right   Extremity/Trunk Assessment Upper Extremity Assessment Upper Extremity Assessment: Overall WFL for tasks assessed   Lower Extremity Assessment Lower Extremity Assessment: Overall WFL for tasks assessed   Cervical / Trunk Assessment Cervical / Trunk Assessment: Other exceptions (s/p surgery)   Communication Communication Communication: No difficulties   Cognition Arousal/Alertness: Awake/alert Behavior During Therapy: WFL for tasks assessed/performed Overall Cognitive Status:deficits                                     General Comments  dressing is in place and noted to have dried blood drainage on bandage.    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      Prior Functioning/Environment Level of Independence: Independent        Comments: reports brother name is Robert        OT Problem List: Impaired balance (sitting and/or standing);Impaired  vision/perception;Decreased cognition;Decreased safety awareness;Decreased knowledge of use of DME or AE;Decreased knowledge of precautions      OT Treatment/Interventions: Self-care/ADL training;Energy conservation;DME and/or AE instruction;Therapeutic activities;Cognitive remediation/compensation;Visual/perceptual remediation/compensation;Patient/family education;Balance training    OT Goals(Current goals can be found in the care plan section) Acute Rehab OT Goals Patient Stated Goal: to return home OT Goal Formulation: Patient unable to participate in goal setting Time For Goal Achievement: 06/10/21 Potential to Achieve Goals: Good  OT Frequency: Min 2X/week   Barriers to D/C: Decreased caregiver support  lives alone uncertain (A) level       Co-evaluation              AM-PAC OT "6 Clicks" Daily Activity     Outcome Measure Help from another person eating meals?: A Little Help from another person taking care of personal grooming?: A Little Help from another person toileting, which includes using toliet, bedpan, or urinal?: A Little Help from another person bathing (including washing, rinsing, drying)?: A Little Help from another person to put on and taking off regular upper body clothing?: A Little Help from another person to put on  and taking off regular lower body clothing?: A Little 6 Click Score: 18   End of Session Equipment Utilized During Treatment: Gait belt;Rolling walker Nurse Communication: Mobility status;Precautions  Activity Tolerance: Patient tolerated treatment well Patient left: in chair;with call bell/phone within reach;with chair alarm set  OT Visit Diagnosis: Unsteadiness on feet (R26.81);Muscle weakness (generalized) (M62.81);Cognitive communication deficit (R41.841)                Time: 4158-3094 OT Time Calculation (min): 16 min Charges:  OT General Charges $OT Visit: 1 Visit OT Evaluation $OT Eval Moderate Complexity: 1 Mod   Brynn, OTR/L   Acute Rehabilitation Services Pager: [phone] Office: [phone] .   Jeri Modena 05/27/2021, 9:52 AM

## 2021-05-27 NOTE — Progress Notes (Addendum)
Neurology Progress Note  Patient ID: Dylan Shaw is a 55 y.o. with PMHx of recently diagnosed metastatic lung cancer, 35-pack-year smoking history and ongoing smoking, who presented with weakness/fatigue/SOB/30 pounds weight loss over 6 weeks as well as back pain. Initially consulted for: Acute onset aphasia while working with physical therapy (expressive greater than receptive, right facial droop and slight right arm drift  Major interval events:  -EEG completed, negative for epileptogenic activity -MRI brain completed, as detailed below  Subjective: - Reports aphasia improving from yesterday  - Denies headache  Exam: Vitals:   05/27/21 0523 05/27/21 0721  BP: 98/64 125/83  Pulse: 80 86  Resp: 18 14  Temp: 98.3 F (36.8 C) (!) 97.4 F (36.3 C)  SpO2: 100% 97%   Gen: In bed, comfortable, cachectic  Resp: non-labored breathing, no grossly audible wheezing Cardiac: Perfusing extremities well  Abd: soft, nt  Neuro: MS: Continues to have significant aphasia (names months of the year when asked to name days of the week, cannot name current holiday), but able to follow multistep commands and repeat today. Has motor apraxia for complex finger positions.  CN: No facial droop today, tongue midline, EOMI Motor: No pronator drift today. Right hip 4+/5, left hip 4-/5 for flexion   Pertinent data: Basic Metabolic Panel: Recent Labs  Lab 05/24/21 1152 05/25/21 1917 05/25/21 2021  NA 132* 136 136  K 3.9 3.7 4.1  CL 92*  --   --   CO2 30  --   --   GLUCOSE 127*  --   --   BUN 16  --   --   CREATININE 0.80  --   --   CALCIUM 9.3  --   --     CBC: Recent Labs  Lab 05/24/21 1152 05/25/21 1917 05/25/21 2021  WBC 9.0  --   --   NEUTROABS 6.9  --   --   HGB 12.7* 9.2* 9.2*  HCT 38.5* 27.0* 27.0*  MCV 87.5  --   --   PLT 328  --   --     Estimated Creatinine Clearance: 69.6 mL/min (by C-G formula based on SCr of 0.8 mg/dL).    MRI brain personally reviewed, enhancing  metastases in the bilateral frontal lobes, occipital lobes and right cerebellum.  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 Discussed potential mood side effects of Keppra with patient and he is agreeable to starting this medication.   Recommendations: - Keppra 500 mg BID  - Standard seizure precautions reviewed with patient, please include in discharge instructions (patient reports he does not have a driver's license and does not drive) Per [employer], patients with seizures are not allowed to drive until  they have been seizure-free for six months. Use caution when using heavy equipment or power tools. Avoid working on ladders or at heights. Take showers instead of baths. Ensure the water temperature is not too high on the home water heater. Do not go swimming alone. When caring for infants or small children, sit down when holding, feeding, or changing them to minimize risk of injury to the child in the event you have a seizure.  To reduce risk of seizures, maintain good sleep hygiene avoid  alcohol and illicit drug use, take all anti-seizure medications as prescribed.  - Appreciate excellent care by neurosurgery/oncology.  Neurology will be available on an as-needed basis going forward.  Lesleigh Noe MD-PhD [facility] [phone]   A total of 35 minutes were spent in care of this patient today, over 50% of which was in direct patient/family contact including reviewing diagnosis, medication counseling, seizure precaution counseling as detailed above.

## 2021-05-27 NOTE — Progress Notes (Signed)
PROGRESS NOTE    Dylan Shaw  UXN:[id number] DOB: 1966-08-11 DOA: 05/24/2021 PCP: Patient, No Pcp Per (Inactive)   Brief Narrative: Dylan Shaw is a 55 y.o. male with a history of smoking presented secondary to 50 pound weight loss, fatigue and low back pain. He was found to have evidence of a lung and spine mass concerning for metastatic cancer. He was started on decadron. During hospitalization he developed urinary and bowel incontinence leading to neurosurgical management and surgical decompression of the spine.    Assessment & Plan:   Active Problems:   Thoracic spine tumor   Lung cancer ([place])   Protein calorie malnutrition ([place])   Metastatic cancer to spine ([place])   Brain metastasis ([place])   Lung mass T12 soft tissue mass Concern for pulmonary malignancy with metastasis to spine and brain. Radiation/medical oncology consulted. Pulmonology initially consulted for pulmonary biopsy. Neurosurgery consulted for spine mass. Patient underwent partial vertebrectomy and laminectomy with resection of mass on 5/28 by neurosurgery with specimen obtained for biopsy. MRI significant for confirmation of metastatic brain lesions with associated edema and 11 mm rightward midline shift anteriorly. PT/OT recommending SNF -Medical oncology recommendations: Plan for chemotherapy -Radiation oncology recommendations: Decadron. Plan for reevaluation on 5/31 -Neurosurgery recommendations: Okay to restart heparin subq on 5/30 -Pulmonology recommendations: Signed off  Aphasia Focal seizure Code stroke called on 5/29 with negative CT and MRI for stroke. EEG ordered and without evidence of seizure activity. 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 Started on [place]  Severe malnutrition Dietitian consulted. Underweight.   Tobacco abuse Concern for possible COPD. Counseled.  Underweight Body mass index is 15.92 kg/m.   DVT prophylaxis: SCDs, Heparin subq Code Status:    Code Status: Full Code Family Communication: None at bedside Disposition Plan: Discharge to SNF in several days. Will likely require transfer to [facility] prior to discharge for radiation oncology management   Consultants:   Medical oncology  Radiation oncology  Pulmonology  Neurosurgery  Neurolog  Procedures:  1. PLACEMENT OF PERCUTANEOUS PEDICLE SCREWS T 10 - L 2 AND RESECTION OF THORACIC SPINE MASS with partial vertebrectomy and laminectomy with resection of epidural metastasis and decompression of thoracic spinal cord (05/25/2021)  Antimicrobials:  None    Subjective: No concerns this morning. States his IV came out.  Objective: Vitals:   05/26/21 2323 05/27/21 0523 05/27/21 0721 05/27/21 1106  BP: 106/61 98/64 125/83 120/85  Pulse: 72 80 86 84  Resp: 18 18 14 16   Temp: 97.9 F (36.6 C) 98.3 F (36.8 C) (!) 97.4 F (36.3 C) 98.4 F (36.9 C)  TempSrc: Oral  Oral Oral  SpO2:  100% 97% 99%  Weight:      Height:        Intake/Output Summary (Last 24 hours) at 05/27/2021 1408 Last data filed at 05/27/2021 1300 Gross per 24 hour  Intake 2097.51 ml  Output 350 ml  Net 1747.51 ml   Filed Weights   05/24/21 2208 05/25/21 0601 05/25/21 1737  Weight: 46.1 kg 46.1 kg 46.1 kg    Examination:  General exam: Appears calm and comfortable. Cachectic appearing. Respiratory system: Clear to auscultation. Respiratory effort normal. Cardiovascular system: S1 & S2 heard, RRR. No murmurs, rubs, gallops or clicks. Gastrointestinal system: Abdomen is nondistended, soft and nontender. No organomegaly or masses felt. Normal bowel sounds heard. Central nervous system: Alert. No focal neurological deficits. Musculoskeletal: No edema. No calf tenderness Skin: No cyanosis. No rashes. Right hand with dried blood Psychiatry:  Judgement and insight appear normal. Mood & affect appropriate.    Data Reviewed: I have personally reviewed following labs and imaging  studies  CBC Lab Results  Component Value Date   WBC 9.0 05/24/2021   RBC 4.40 05/24/2021   HGB 9.2 (L) 05/25/2021   HCT 27.0 (L) 05/25/2021   MCV 87.5 05/24/2021   MCH 28.9 05/24/2021   PLT 328 05/24/2021   MCHC 33.0 05/24/2021   RDW 13.1 05/24/2021   LYMPHSABS 1.3 05/24/2021   MONOABS 0.7 05/24/2021   EOSABS 0.0 05/24/2021   BASOSABS 0.0 73/71/0626     Last metabolic panel Lab Results  Component Value Date   NA 136 05/25/2021   K 4.1 05/25/2021   CL 92 (L) 05/24/2021   CO2 30 05/24/2021   BUN 16 05/24/2021   CREATININE 0.80 05/24/2021   GLUCOSE 127 (H) 05/24/2021   GFRNONAA >60 05/24/2021   CALCIUM 9.3 05/24/2021   PROT 7.7 05/24/2021   ALBUMIN 2.7 (L) 05/24/2021   BILITOT 0.9 05/24/2021   ALKPHOS 92 05/24/2021   AST 15 05/24/2021   ALT 11 05/24/2021   ANIONGAP 10 05/24/2021    CBG (last 3)  Recent Labs    05/26/21 1130  GLUCAP 122*     GFR: Estimated Creatinine Clearance: 69.6 mL/min (by C-G formula based on SCr of 0.8 mg/dL).  Coagulation Profile: No results for input(s): INR, PROTIME in the last 168 hours.  Recent Results (from the past 240 hour(s))  Resp Panel by RT-PCR (Flu A&B, Covid) Nasopharyngeal Swab     Status: None   Collection Time: 05/24/21 11:51 AM   Specimen: Nasopharyngeal Swab; Nasopharyngeal(NP) swabs in vial transport medium  Result Value Ref Range Status   SARS Coronavirus 2 by RT PCR NEGATIVE NEGATIVE Final    Comment: (NOTE) SARS-CoV-2 target nucleic acids are NOT DETECTED.  The SARS-CoV-2 RNA is generally detectable in upper respiratory specimens during the acute phase of infection. The lowest concentration of SARS-CoV-2 viral copies this assay can detect is 138 copies/mL. A negative result does not preclude SARS-Cov-2 infection and should not be used as the sole basis for treatment or other patient management decisions. A negative result may occur with  improper specimen collection/handling, submission of specimen  other than nasopharyngeal swab, presence of viral mutation(s) within the areas targeted by this assay, and inadequate number of viral copies(<138 copies/mL). A negative result must be combined with clinical observations, patient history, and epidemiological information. The expected result is Negative.  Fact Sheet for Patients:  EntrepreneurPulse.com.au  Fact Sheet for Healthcare Providers:  IncredibleEmployment.be  This test is no t yet approved or cleared by the [place] FDA and  has been authorized for detection and/or diagnosis of SARS-CoV-2 by FDA under an Emergency Use Authorization (EUA). This EUA will remain  in effect (meaning this test can be used) for the duration of the COVID-19 declaration under Section 564(b)(1) of the Act, 21 U.S.C.section 360bbb-3(b)(1), unless the authorization is terminated  or revoked sooner.       Influenza A by PCR NEGATIVE NEGATIVE Final   Influenza B by PCR NEGATIVE NEGATIVE Final    Comment: (NOTE) The Xpert Xpress SARS-CoV-2/FLU/RSV plus assay is intended as an aid in the diagnosis of influenza from Nasopharyngeal swab specimens and should not be used as a sole basis for treatment. Nasal washings and aspirates are unacceptable for Xpert Xpress SARS-CoV-2/FLU/RSV testing.  Fact Sheet for Patients: EntrepreneurPulse.com.au  Fact Sheet for Healthcare Providers: IncredibleEmployment.be  This test is not  yet approved or cleared by the [place] and has been authorized for detection and/or diagnosis of SARS-CoV-2 by FDA under an Emergency Use Authorization (EUA). This EUA will remain in effect (meaning this test can be used) for the duration of the COVID-19 declaration under Section 564(b)(1) of the Act, 21 U.S.C. section 360bbb-3(b)(1), unless the authorization is terminated or revoked.  Performed at [facility], [address] [place], [place]  [address]   Surgical pcr screen     Status: None   Collection Time: 05/25/21  6:02 PM   Specimen: Nasal Mucosa; Nasal Swab  Result Value Ref Range Status   MRSA, PCR NEGATIVE NEGATIVE Final   Staphylococcus aureus NEGATIVE NEGATIVE Final    Comment: (NOTE) The Xpert SA Assay (FDA approved for NASAL specimens in patients 32 years of age and older), is one component of a comprehensive surveillance program. It is not intended to diagnose infection nor to guide or monitor treatment. Performed at [facility], [place] [address], [place], [place] [address]         Radiology Studies: DG THORACOLUMABAR SPINE  Result Date: 05/25/2021 CLINICAL DATA:  T12 tumor removal and screw placement EXAM: THORACOLUMBAR SPINE - 2 VIEW; DG C-ARM 1-60 MIN COMPARISON:  MRI from the previous day. FLUOROSCOPY TIME:  Fluoroscopy Time:  4 minutes 16 seconds Radiation Exposure Index (if provided by the fluoroscopic device): 52.03 mGy Number of Acquired Spot Images: 4 FINDINGS: T12 compression fracture is again noted. Pedicle screws at T10, T11 L1 and L2 are seen. Posterior fixation is noted. IMPRESSION: Thoracolumbar fixation. Electronically Signed   By: Inez Catalina M.D.   On: 05/25/2021 21:33   MR BRAIN W WO CONTRAST  Result Date: 05/26/2021 CLINICAL DATA:  Abnormality on recent CT head. Concern for metastases. EXAM: MRI HEAD WITHOUT AND WITH CONTRAST TECHNIQUE: Multiplanar, multiecho pulse sequences of the brain and surrounding structures were obtained without and with intravenous contrast. CONTRAST:  29mL GADAVIST GADOBUTROL 1 MMOL/ML IV SOLN COMPARISON:  CT head from the same day. FINDINGS: Brain: Multiple enhancing, heterogeneous and partially calcified metastases, which are measured on series 1100: -5.4 cm lesion in the left frontal lobe (image 226) -3.6 cm in lesion in the right frontal lobe (image 222) -3.7 cm right and 4.0 cm left occipital lesions (image 157) -1.1 cm right cerebellar lesion (image 110)  Associated surrounding edema and mass effect. The left frontal lesion has the greatest mass effect with effacement of the left lateral ventricle and approximately 11 mm of rightward midline shift anteriorly. Curvilinear areas of enhancement in the high left frontal lobe (image 252). Small curvilinear areas of enhancement in the left parietal white matter (images 211 through 213) may represent subacute infarcts given suggestion of diffusion signal abnormality or small vessels. Additional small curvilinear areas of enhancement in the right posterior temporal lobe (image 161) are favored to represent a vessels but warrants attention on follow-up. Evidence of prior hemorrhage in the inferior right basal ganglia, which is nonenhancing. No evidence of acute hemorrhage outside of the metastases or hydrocephalus. Vascular: Major arterial flow voids are maintained at the skull base. Skull and upper cervical spine: Normal marrow signal. Sinuses/Orbits: Clear sinuses.  Unremarkable orbits. Other: No mastoid effusions. IMPRESSION: 1. Enhancing metastases in bilateral frontal lobes, bilateral occipital lobes, and the right cerebellum, as detailed above. Surrounding edema and mass effect with 11 mm of rightward midline shift anteriorly. 2. Small curvilinear areas of enhancement in the left parietal white matter are indeterminate but  may represent small subacute infarcts given suggestion of diffusion signal abnormality or small vessels. Recommend attention on short interval follow-up to exclude additional metastases. The 3. Nonenhancing small prior hemorrhage in the inferior right basal ganglia. Electronically Signed   By: Margaretha Sheffield MD   On: 05/26/2021 20:34   EEG adult  Result Date: 05/26/2021 Derek Jack, MD     05/26/2021  8:07 PM Routine EEG Report Dylan Shaw is a 55 y.o. male with a history of metastatic cancer to the brain who is undergoing an EEG to evaluate for seizures. Report: This EEG was acquired  with electrodes placed according to the International 10-20 electrode system (including Fp1, Fp2, F3, F4, C3, C4, P3, P4, O1, O2, T3, T4, T5, T6, A1, A2, Fz, Cz, Pz). The following electrodes were missing or displaced: none. The occipital dominant rhythm was 7-8 Hz. This activity is reactive to stimulation. Drowsiness was manifested by background fragmentation; deeper stages of sleep were not identified. There was focal slowing over the left frontal > right frontal regions. There were no interictal epileptiform discharges. There were no electrographic seizures identified. There was no abnormal response to photic stimulation or hyperventilation. Impression and clinical correlation: This EEG was obtained while awake and drowsy and is abnormal due to mild diffuse slowing indicating mild global cerebral dysfunction and focal slowing over the left>right frontal regions indicating superimposed focal cerebral dysfunction in those regions. Su Monks, MD [facility] [phone] If 7pm- 7am, please page neurology on call as listed in [place].   DG C-Arm 1-60 Min  Result Date: 05/25/2021 CLINICAL DATA:  T12 tumor removal and screw placement EXAM: THORACOLUMBAR SPINE - 2 VIEW; DG C-ARM 1-60 MIN COMPARISON:  MRI from the previous day. FLUOROSCOPY TIME:  Fluoroscopy Time:  4 minutes 16 seconds Radiation Exposure Index (if provided by the fluoroscopic device): 52.03 mGy Number of Acquired Spot Images: 4 FINDINGS: T12 compression fracture is again noted. Pedicle screws at T10, T11 L1 and L2 are seen. Posterior fixation is noted. IMPRESSION: Thoracolumbar fixation. Electronically Signed   By: Inez Catalina M.D.   On: 05/25/2021 21:33   CT HEAD CODE STROKE WO CONTRAST  Result Date: 05/26/2021 CLINICAL DATA:  Code stroke.  Neuro deficit with stroke suspected EXAM: CT HEAD WITHOUT CONTRAST TECHNIQUE: Contiguous axial images were obtained from the base of the skull through the vertex without intravenous contrast.  COMPARISON:  None. FINDINGS: Brain: Bilateral occipital and bilateral frontal partially calcified brain masses consistent with metastatic disease. The largest is in the left frontal region at approximately 5 cm on axial slices. Moderate vasogenic edema greatest around the largest lesions. There is local mass effect in the areas of tumoral thickening and brain edema. No superimposed infarct is seen. High-density areas appear most like mineralization. No hydrocephalus. Vascular: Negative Skull: No noted bony metastasis Sinuses/Orbits: Negative Other: These results were called by telephone at the time of interpretation on 05/26/2021 at 11:48 am to provider [facility] , who is already aware IMPRESSION: At least 4 large brain metastases measuring up to 5 cm in the left frontal lobe. Associated vasogenic edema and regional mass effect. Electronically Signed   By: Monte Fantasia M.D.   On: 05/26/2021 11:52        Scheduled Meds: . dexamethasone (DECADRON) injection  4 mg Intravenous Q6H  . dexamethasone (DECADRON) injection  4 mg Intravenous Q6H   Or  . dexamethasone  4 mg Oral Q6H  . docusate sodium  100 mg Oral BID  .  feeding supplement  237 mL Oral TID BM  . pantoprazole  40 mg Oral QHS  . sodium chloride flush  3 mL Intravenous Q12H   Continuous Infusions: . sodium chloride    . sodium chloride    . dextrose 5 % and 0.45 % NaCl with KCl 20 mEq/L 75 mL/hr at 05/27/21 1215  . levETIRAcetam 500 mg (05/27/21 1224)  . methocarbamol (ROBAXIN) IV       LOS: 3 days     Cordelia Poche, MD [facility] 05/27/2021, 2:08 PM  If 7PM-7AM, please contact night-coverage [email or web address]

## 2021-05-27 NOTE — Evaluation (Signed)
Physical Therapy Evaluation Patient Details Name: Dylan Shaw MRN: [id number] DOB: September 10, 1966 Today's Date: 05/27/2021   History of Present Illness  55 y.o. man w/ new metastatic disease s/p thoracolumbar decompression and fusion. Now w/ expressive aphasia 5/29 , self-limited, likely focal seizure. PMH met CA with primary site Lungs, smoker, 50 pound weight loss  Clinical Impression  Prior to admission, pt lives alone and works in [employer]. Pt overall reporting good pain control. Presents with decreased functional mobility secondary to decreased cognition and poor dynamic balance. Pt oriented to self and place; pt repeatedly stating the month was January or February despite re-orientation. Displays short term memory deficits and decreased safety awareness. Pt ambulating hallway distances with min assist for stability; demonstrates intermittent scissoring and drifts left/right. Pt emotionally labile several times during session spontaneously and is unable to state why he is upset. Due to deficits and decreased caregiver support (pt brother works during the day), recommending SNF.     Follow Up Recommendations SNF (unless pt brother able to provide 24/7)    Equipment Recommendations  Rolling walker with 5" wheels;3in1 (PT)    Recommendations for Other Services       Precautions / Restrictions Precautions Precautions: Back Precaution Booklet Issued: Yes (comment) Precaution Comments: Verbally reviewed, handout in room Restrictions Weight Bearing Restrictions: No      Mobility  Bed Mobility Overal bed mobility: Needs Assistance Bed Mobility: Supine to Sit;Rolling Rolling: Supervision   Supine to sit: Min guard     General bed mobility comments: mod cues for sequence and back precautions but able to power up with cues    Transfers Overall transfer level: Needs assistance Equipment used: Rolling walker (2 wheeled) Transfers: Sit to/from Stand Sit to Stand: Min guard          General transfer comment: pt able to power up into standing with RW. pt needs mod cues for placement of RW for safety  Ambulation/Gait Ambulation/Gait assistance: Min guard;Min assist Gait Distance (Feet): 250 Feet Assistive device: Rolling walker (2 wheeled);None Gait Pattern/deviations: Step-through pattern;Scissoring;Drifts right/left Gait velocity: decreased   General Gait Details: Pt ambulating in room with RW and min guard assist, then in hallway with no AD and up to minA. With no hand support, pt tends to drift right/left with intermittent scissoring.  Stairs            Wheelchair Mobility    Modified Rankin (Stroke Patients Only) Modified Rankin (Stroke Patients Only) Pre-Morbid Rankin Score: No symptoms Modified Rankin: Moderately severe disability     Balance Overall balance assessment: Needs assistance Sitting-balance support: Feet supported Sitting balance-Leahy Scale: Fair     Standing balance support: During functional activity;No upper extremity supported Standing balance-Leahy Scale: Fair Standing balance comment: Fair statically                             Pertinent Vitals/Pain Pain Assessment: No/denies pain    Home Living Family/patient expects to be discharged to:: Private residence Living Arrangements: Alone Available Help at Discharge: Family;Available PRN/intermittently Type of Home: Mobile home Home Access: Stairs to enter   Entrance Stairs-Number of Steps: 3 Home Layout: One level Home Equipment: None Additional Comments: reports brother name is Herbie [place]    Prior Function Level of Independence: Independent         Comments: works in [place]: Right    Extremity/Trunk Assessment   Upper  Extremity Assessment Upper Extremity Assessment: Overall WFL for tasks assessed    Lower Extremity Assessment Lower Extremity Assessment: Overall WFL for tasks assessed     Cervical / Trunk Assessment Cervical / Trunk Assessment: Other exceptions (s/p surgery) Cervical / Trunk Exceptions: s/p T12 mass resection  Communication   Communication: No difficulties  Cognition Arousal/Alertness: Awake/alert Behavior During Therapy: WFL for tasks assessed/performed Overall Cognitive Status: Impaired/Different from baseline Area of Impairment: Orientation;Memory;Safety/judgement;Awareness                 Orientation Level: Disoriented to;Time;Situation   Memory: Decreased short-term memory;Decreased recall of precautions   Safety/Judgement: Decreased awareness of deficits Awareness: Intellectual   General Comments: Pt oriented to self and place; not oriented to time, stating month was January or Feburary. When cued it was [facility], pt then stating it was June. No recall after re-orientation or education on precautions. Emotionally labile when unable to state what type of dog he has.      General Comments General comments (skin integrity, edema, etc.): dressing is in place and noted to have dried blood drainage on bandage.    Exercises     Assessment/Plan    PT Assessment Patient needs continued PT services  PT Problem List Decreased balance;Decreased mobility;Decreased cognition;Decreased safety awareness;Decreased coordination       PT Treatment Interventions DME instruction;Gait training;Stair training;Functional mobility training;Therapeutic activities;Therapeutic exercise;Balance training;Patient/family education;Cognitive remediation    PT Goals (Current goals can be found in the Care Plan section)  Acute Rehab PT Goals Patient Stated Goal: to return home PT Goal Formulation: With patient Time For Goal Achievement: 06/10/21 Potential to Achieve Goals: Good    Frequency Min 5X/week   Barriers to discharge        Co-evaluation               AM-PAC PT "6 Clicks" Mobility  Outcome Measure Help needed turning from your back  to your side while in a flat bed without using bedrails?: A Little Help needed moving from lying on your back to sitting on the side of a flat bed without using bedrails?: A Little Help needed moving to and from a bed to a chair (including a wheelchair)?: A Little Help needed standing up from a chair using your arms (e.g., wheelchair or bedside chair)?: A Little Help needed to walk in hospital room?: A Little Help needed climbing 3-5 steps with a railing? : A Lot 6 Click Score: 17    End of Session Equipment Utilized During Treatment: Gait belt Activity Tolerance: Patient tolerated treatment well Patient left: in chair;with call bell/phone within reach;with chair alarm set Nurse Communication: Mobility status PT Visit Diagnosis: Unsteadiness on feet (R26.81)    Time: 1443-1540 PT Time Calculation (min) (ACUTE ONLY): 18 min   Charges:   PT Evaluation $PT Eval Moderate Complexity: [place], PT, DPT [place] Pager [phone] Office [place] 05/27/2021, 11:32 AM

## 2021-05-28 ENCOUNTER — Other Ambulatory Visit: Payer: Self-pay | Admitting: Radiation Therapy

## 2021-05-28 ENCOUNTER — Telehealth: Payer: Self-pay | Admitting: *Deleted

## 2021-05-28 ENCOUNTER — Ambulatory Visit
Admit: 2021-05-28 | Discharge: 2021-05-28 | Disposition: A | Discharge status: Home / Self Care | Payer: 59 | Attending: Radiation Oncology | Admitting: Radiation Oncology

## 2021-05-28 DIAGNOSIS — Z7189 Other specified counseling: Secondary | ICD-10-CM

## 2021-05-28 DIAGNOSIS — Z515 Encounter for palliative care: Secondary | ICD-10-CM

## 2021-05-28 DIAGNOSIS — Z66 Do not resuscitate: Secondary | ICD-10-CM

## 2021-05-28 LAB — CBC
HCT: 31.7 % — ABNORMAL LOW (ref 39.0–52.0)
Hemoglobin: 10.1 g/dL — ABNORMAL LOW (ref 13.0–17.0)
MCH: 27.8 pg (ref 26.0–34.0)
MCHC: 31.9 g/dL (ref 30.0–36.0)
MCV: 87.3 fL (ref 80.0–100.0)
Platelets: 327 10*3/uL (ref 150–400)
RBC: 3.63 MIL/uL — ABNORMAL LOW (ref 4.22–5.81)
RDW: 13.2 % (ref 11.5–15.5)
WBC: 10.3 10*3/uL (ref 4.0–10.5)
nRBC: 0 % (ref 0.0–0.2)

## 2021-05-28 LAB — BASIC METABOLIC PANEL
Anion gap: 8 (ref 5–15)
BUN: 11 mg/dL (ref 6–20)
CO2: 29 mmol/L (ref 22–32)
Calcium: 8.9 mg/dL (ref 8.9–10.3)
Chloride: 94 mmol/L — ABNORMAL LOW (ref 98–111)
Creatinine, Ser: 0.7 mg/dL (ref 0.61–1.24)
GFR, Estimated: >60 mL/min (ref >60)
Glucose, Bld: 123 mg/dL — ABNORMAL HIGH (ref 70–99)
Potassium: 4.4 mmol/L (ref 3.5–5.1)
Sodium: 131 mmol/L — ABNORMAL LOW (ref 135–145)

## 2021-05-28 MED ORDER — LEVETIRACETAM 500 MG PO TABS
500.0000 mg | ORAL_TABLET | Freq: Two times a day (BID) | ORAL | Status: DC
  Administered 2021-05-28 – 2021-06-28 (×63): 500 mg via ORAL
  Filled 2021-05-28 (×63): qty 1

## 2021-05-28 NOTE — Progress Notes (Signed)
PROGRESS NOTE    Dylan Shaw  ALP:[id number] DOB: 09-04-1966 DOA: 05/24/2021 PCP: Patient, No Pcp Per (Inactive)   Brief Narrative: Dylan Shaw is a 55 y.o. male with a history of smoking presented secondary to 50 pound weight loss, fatigue and low back pain. He was found to have evidence of a lung and spine mass concerning for metastatic cancer. He was started on decadron. During hospitalization he developed urinary and bowel incontinence leading to neurosurgical management and surgical decompression of the spine.    Assessment & Plan:   Active Problems:   Thoracic spine tumor   Lung cancer ([place])   Protein calorie malnutrition ([place])   Metastatic cancer to spine ([place])   Brain metastasis ([place])   Lung mass T12 soft tissue mass Concern for pulmonary malignancy with metastasis to spine and brain. Radiation/medical oncology consulted. Pulmonology initially consulted for pulmonary biopsy. Neurosurgery consulted for spine mass. Patient underwent partial vertebrectomy and laminectomy with resection of mass on 5/28 by neurosurgery with specimen obtained for biopsy. MRI significant for confirmation of metastatic brain lesions with associated edema and 11 mm rightward midline shift anteriorly. PT/OT recommending SNF -Medical oncology recommendations: Plan for chemotherapy -Radiation oncology recommendations: Decadron. Plan for reevaluation on 5/31 -Neurosurgery recommendations: continue Decadron and Keppra; recommending hospice as likely disposition -Pulmonology recommendations: Signed off -Palliative care consult  Aphasia Focal seizure Code stroke called on 5/29 with negative CT and MRI for stroke. EEG ordered and without evidence of seizure activity. 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 Started on [place]  Severe malnutrition Dietitian consulted. Underweight.   Tobacco abuse Concern for possible COPD. Counseled.  Underweight Body mass index is 15.92  kg/m.   DVT prophylaxis: SCDs, Heparin subq Code Status:   Code Status: Full Code Family Communication: None at bedside Disposition Plan: Discharge to SNF in several days. Will likely require transfer to [facility] prior to discharge for radiation oncology management   Consultants:   Medical oncology  Radiation oncology  Pulmonology  Neurosurgery  Neurolog  Procedures:  1. PLACEMENT OF PERCUTANEOUS PEDICLE SCREWS T 10 - L 2 AND RESECTION OF THORACIC SPINE MASS with partial vertebrectomy and laminectomy with resection of epidural metastasis and decompression of thoracic spinal cord (05/25/2021)  Antimicrobials:  None    Subjective: Patient reports no issues today.  Objective: Vitals:   05/27/21 2342 05/28/21 0321 05/28/21 0741 05/28/21 1127  BP: (!) 142/80 (!) 149/99 (!) 140/92 124/87  Pulse: 80 82 91 91  Resp: 18  18 17   Temp: 97.8 F (36.6 C) 98 F (36.7 C) 98.5 F (36.9 C) 98.5 F (36.9 C)  TempSrc: Oral Oral Oral   SpO2: 100% 100% 99% 100%  Weight:      Height:        Intake/Output Summary (Last 24 hours) at 05/28/2021 1232 Last data filed at 05/28/2021 0600 Gross per 24 hour  Intake 1160 ml  Output 2950 ml  Net -1790 ml   Filed Weights   05/24/21 2208 05/25/21 0601 05/25/21 1737  Weight: 46.1 kg 46.1 kg 46.1 kg    Examination:  General exam: Appears calm and comfortable. Cachectic  Respiratory system: Clear to auscultation. Respiratory effort normal. Cardiovascular system: S1 & S2 heard, RRR. No murmurs, rubs, gallops or clicks. Gastrointestinal system: Abdomen is nondistended, soft and nontender. No organomegaly or masses felt. Normal bowel sounds heard. Central nervous system: Alert and oriented to self only Musculoskeletal: No edema. No calf tenderness Skin: No cyanosis. No rashes. Thoracic incision  honeycomb dressing with some old blood saturation Psychiatry: Judgement and insight appear impaired. Memory impaired.   Data  Reviewed: I have personally reviewed following labs and imaging studies  CBC Lab Results  Component Value Date   WBC 10.3 05/28/2021   RBC 3.63 (L) 05/28/2021   HGB 10.1 (L) 05/28/2021   HCT 31.7 (L) 05/28/2021   MCV 87.3 05/28/2021   MCH 27.8 05/28/2021   PLT 327 05/28/2021   MCHC 31.9 05/28/2021   RDW 13.2 05/28/2021   LYMPHSABS 1.3 05/24/2021   MONOABS 0.7 05/24/2021   EOSABS 0.0 05/24/2021   BASOSABS 0.0 02/72/5366     Last metabolic panel Lab Results  Component Value Date   NA 131 (L) 05/28/2021   K 4.4 05/28/2021   CL 94 (L) 05/28/2021   CO2 29 05/28/2021   BUN 11 05/28/2021   CREATININE 0.70 05/28/2021   GLUCOSE 123 (H) 05/28/2021   GFRNONAA >60 05/28/2021   CALCIUM 8.9 05/28/2021   PROT 7.7 05/24/2021   ALBUMIN 2.7 (L) 05/24/2021   BILITOT 0.9 05/24/2021   ALKPHOS 92 05/24/2021   AST 15 05/24/2021   ALT 11 05/24/2021   ANIONGAP 8 05/28/2021    CBG (last 3)  Recent Labs    05/26/21 1130  GLUCAP 122*     GFR: Estimated Creatinine Clearance: 69.6 mL/min (by C-G formula based on SCr of 0.7 mg/dL).  Coagulation Profile: No results for input(s): INR, PROTIME in the last 168 hours.  Recent Results (from the past 240 hour(s))  Resp Panel by RT-PCR (Flu A&B, Covid) Nasopharyngeal Swab     Status: None   Collection Time: 05/24/21 11:51 AM   Specimen: Nasopharyngeal Swab; Nasopharyngeal(NP) swabs in vial transport medium  Result Value Ref Range Status   SARS Coronavirus 2 by RT PCR NEGATIVE NEGATIVE Final    Comment: (NOTE) SARS-CoV-2 target nucleic acids are NOT DETECTED.  The SARS-CoV-2 RNA is generally detectable in upper respiratory specimens during the acute phase of infection. The lowest concentration of SARS-CoV-2 viral copies this assay can detect is 138 copies/mL. A negative result does not preclude SARS-Cov-2 infection and should not be used as the sole basis for treatment or other patient management decisions. A negative result may occur  with  improper specimen collection/handling, submission of specimen other than nasopharyngeal swab, presence of viral mutation(s) within the areas targeted by this assay, and inadequate number of viral copies(<138 copies/mL). A negative result must be combined with clinical observations, patient history, and epidemiological information. The expected result is Negative.  Fact Sheet for Patients:  EntrepreneurPulse.com.au  Fact Sheet for Healthcare Providers:  IncredibleEmployment.be  This test is no t yet approved or cleared by the [place] FDA and  has been authorized for detection and/or diagnosis of SARS-CoV-2 by FDA under an Emergency Use Authorization (EUA). This EUA will remain  in effect (meaning this test can be used) for the duration of the COVID-19 declaration under Section 564(b)(1) of the Act, 21 U.S.C.section 360bbb-3(b)(1), unless the authorization is terminated  or revoked sooner.       Influenza A by PCR NEGATIVE NEGATIVE Final   Influenza B by PCR NEGATIVE NEGATIVE Final    Comment: (NOTE) The Xpert Xpress SARS-CoV-2/FLU/RSV plus assay is intended as an aid in the diagnosis of influenza from Nasopharyngeal swab specimens and should not be used as a sole basis for treatment. Nasal washings and aspirates are unacceptable for Xpert Xpress SARS-CoV-2/FLU/RSV testing.  Fact Sheet for Patients: EntrepreneurPulse.com.au  Fact Sheet for Healthcare  Providers: IncredibleEmployment.be  This test is not yet approved or cleared by the [place] and has been authorized for detection and/or diagnosis of SARS-CoV-2 by FDA under an Emergency Use Authorization (EUA). This EUA will remain in effect (meaning this test can be used) for the duration of the COVID-19 declaration under Section 564(b)(1) of the Act, 21 U.S.C. section 360bbb-3(b)(1), unless the authorization is terminated  or revoked.  Performed at [facility], [address] [place], [place] [address]   Surgical pcr screen     Status: None   Collection Time: 05/25/21  6:02 PM   Specimen: Nasal Mucosa; Nasal Swab  Result Value Ref Range Status   MRSA, PCR NEGATIVE NEGATIVE Final   Staphylococcus aureus NEGATIVE NEGATIVE Final    Comment: (NOTE) The Xpert SA Assay (FDA approved for NASAL specimens in patients 75 years of age and older), is one component of a comprehensive surveillance program. It is not intended to diagnose infection nor to guide or monitor treatment. Performed at [facility], [place] [address], [place], [place] [address]         Radiology Studies: MR BRAIN W WO CONTRAST  Result Date: 05/26/2021 CLINICAL DATA:  Abnormality on recent CT head. Concern for metastases. EXAM: MRI HEAD WITHOUT AND WITH CONTRAST TECHNIQUE: Multiplanar, multiecho pulse sequences of the brain and surrounding structures were obtained without and with intravenous contrast. CONTRAST:  80mL GADAVIST GADOBUTROL 1 MMOL/ML IV SOLN COMPARISON:  CT head from the same day. FINDINGS: Brain: Multiple enhancing, heterogeneous and partially calcified metastases, which are measured on series 1100: -5.4 cm lesion in the left frontal lobe (image 226) -3.6 cm in lesion in the right frontal lobe (image 222) -3.7 cm right and 4.0 cm left occipital lesions (image 157) -1.1 cm right cerebellar lesion (image 110) Associated surrounding edema and mass effect. The left frontal lesion has the greatest mass effect with effacement of the left lateral ventricle and approximately 11 mm of rightward midline shift anteriorly. Curvilinear areas of enhancement in the high left frontal lobe (image 252). Small curvilinear areas of enhancement in the left parietal white matter (images 211 through 213) may represent subacute infarcts given suggestion of diffusion signal abnormality or small vessels. Additional small curvilinear areas of enhancement in  the right posterior temporal lobe (image 161) are favored to represent a vessels but warrants attention on follow-up. Evidence of prior hemorrhage in the inferior right basal ganglia, which is nonenhancing. No evidence of acute hemorrhage outside of the metastases or hydrocephalus. Vascular: Major arterial flow voids are maintained at the skull base. Skull and upper cervical spine: Normal marrow signal. Sinuses/Orbits: Clear sinuses.  Unremarkable orbits. Other: No mastoid effusions. IMPRESSION: 1. Enhancing metastases in bilateral frontal lobes, bilateral occipital lobes, and the right cerebellum, as detailed above. Surrounding edema and mass effect with 11 mm of rightward midline shift anteriorly. 2. Small curvilinear areas of enhancement in the left parietal white matter are indeterminate but may represent small subacute infarcts given suggestion of diffusion signal abnormality or small vessels. Recommend attention on short interval follow-up to exclude additional metastases. The 3. Nonenhancing small prior hemorrhage in the inferior right basal ganglia. Electronically Signed   By: Margaretha Sheffield MD   On: 05/26/2021 20:34   EEG adult  Result Date: 05/26/2021 Derek Jack, MD     05/26/2021  8:07 PM Routine EEG Report Tyresse Jayson is a 55 y.o. male with a history of metastatic cancer to the brain who is undergoing an  EEG to evaluate for seizures. Report: This EEG was acquired with electrodes placed according to the International 10-20 electrode system (including Fp1, Fp2, F3, F4, C3, C4, P3, P4, O1, O2, T3, T4, T5, T6, A1, A2, Fz, Cz, Pz). The following electrodes were missing or displaced: none. The occipital dominant rhythm was 7-8 Hz. This activity is reactive to stimulation. Drowsiness was manifested by background fragmentation; deeper stages of sleep were not identified. There was focal slowing over the left frontal > right frontal regions. There were no interictal epileptiform discharges. There  were no electrographic seizures identified. There was no abnormal response to photic stimulation or hyperventilation. Impression and clinical correlation: This EEG was obtained while awake and drowsy and is abnormal due to mild diffuse slowing indicating mild global cerebral dysfunction and focal slowing over the left>right frontal regions indicating superimposed focal cerebral dysfunction in those regions. Su Monks, MD [facility] [phone] If 7pm- 7am, please page neurology on call as listed in [facility].        Scheduled Meds: . dexamethasone (DECADRON) injection  4 mg Intravenous Q6H   Or  . dexamethasone  4 mg Oral Q6H  . docusate sodium  100 mg Oral BID  . feeding supplement  237 mL Oral TID BM  . heparin  5,000 Units Subcutaneous Q8H  . levETIRAcetam  500 mg Oral BID  . pantoprazole  40 mg Oral QHS  . sodium chloride flush  3 mL Intravenous Q12H   Continuous Infusions: . sodium chloride    . sodium chloride    . dextrose 5 % and 0.45 % NaCl with KCl 20 mEq/L 75 mL/hr at 05/27/21 1215  . methocarbamol (ROBAXIN) IV       LOS: 4 days     Cordelia Poche, MD [facility] 05/28/2021, 12:32 PM  If 7PM-7AM, please contact night-coverage [email or web address]

## 2021-05-28 NOTE — Progress Notes (Signed)
Physical Therapy Treatment Patient Details Name: Dylan Shaw MRN: [id number] DOB: May 30, 1966 Today's Date: 05/28/2021    History of Present Illness 55 y.o. man w/ new metastatic disease s/p thoracolumbar decompression and fusion. Now w/ expressive aphasia 5/29 , self-limited, likely focal seizure. PMH met CA with primary site Lungs, smoker, 50 pound weight loss    PT Comments    Pt received with urinary incontinence. Assisted out of bed and ambulated limited hallway distances with a walker and min assist for balance. Pt with seemingly improved pain control with walker vs without. Presents as a high fall risk based on decreased safety awareness, balance deficits, and decreased gait speed. Continue to recommend SNF for ongoing Physical Therapy.      Follow Up Recommendations  SNF     Equipment Recommendations  Rolling walker with 5" wheels;3in1 (PT)    Recommendations for Other Services       Precautions / Restrictions Precautions Precautions: Back Precaution Booklet Issued: Yes (comment) Precaution Comments: handout in room Restrictions Weight Bearing Restrictions: No    Mobility  Bed Mobility Overal bed mobility: Needs Assistance Bed Mobility: Rolling;Sidelying to Sit Rolling: Supervision Sidelying to sit: Supervision       General bed mobility comments: Supervision for safety, no physical assist required    Transfers Overall transfer level: Needs assistance Equipment used: None Transfers: Sit to/from Stand Sit to Stand: Min guard         General transfer comment: Min guard to rise to stand from edge of bed  Ambulation/Gait Ambulation/Gait assistance: Min assist Gait Distance (Feet): 150 Feet Assistive device: Rolling walker (2 wheeled);None Gait Pattern/deviations: Step-through pattern;Scissoring;Drifts right/left Gait velocity: decreased   General Gait Details: Requiring minA for stability, impulsive, seemingly improved pain control with walker vs  without   Stairs             Wheelchair Mobility    Modified Rankin (Stroke Patients Only) Modified Rankin (Stroke Patients Only) Pre-Morbid Rankin Score: No symptoms Modified Rankin: Moderately severe disability     Balance Overall balance assessment: Needs assistance Sitting-balance support: Feet supported Sitting balance-Leahy Scale: Fair     Standing balance support: During functional activity;No upper extremity supported Standing balance-Leahy Scale: Fair Standing balance comment: Fair statically                            Cognition Arousal/Alertness: Awake/alert Behavior During Therapy: WFL for tasks assessed/performed Overall Cognitive Status: Impaired/Different from baseline Area of Impairment: Orientation;Memory;Safety/judgement;Awareness                 Orientation Level: Disoriented to;Time;Situation;Place   Memory: Decreased short-term memory;Decreased recall of precautions   Safety/Judgement: Decreased awareness of deficits Awareness: Intellectual   General Comments: A&Ox1 (to self only), follows all 1 step commands, poor awareness      Exercises      General Comments        Pertinent Vitals/Pain Pain Assessment: Faces Faces Pain Scale: Hurts even more Pain Location: back Pain Descriptors / Indicators: Operative site guarding;Grimacing Pain Intervention(s): Monitored during session    Home Living                      Prior Function            PT Goals (current goals can now be found in the care plan section) Acute Rehab PT Goals Patient Stated Goal: to return home PT Goal Formulation: With patient Time For Goal  Achievement: 06/10/21 Potential to Achieve Goals: Good Progress towards PT goals: Progressing toward goals    Frequency    Min 5X/week      PT Plan Current plan remains appropriate    Co-evaluation              AM-PAC PT "6 Clicks" Mobility   Outcome Measure  Help needed  turning from your back to your side while in a flat bed without using bedrails?: A Little Help needed moving from lying on your back to sitting on the side of a flat bed without using bedrails?: A Little Help needed moving to and from a bed to a chair (including a wheelchair)?: A Little Help needed standing up from a chair using your arms (e.g., wheelchair or bedside chair)?: A Little Help needed to walk in hospital room?: A Little Help needed climbing 3-5 steps with a railing? : A Lot 6 Click Score: 17    End of Session Equipment Utilized During Treatment: Gait belt Activity Tolerance: Patient tolerated treatment well Patient left: in chair;with call bell/phone within reach;with chair alarm set Nurse Communication: Mobility status PT Visit Diagnosis: Unsteadiness on feet (R26.81)     Time: 0102-7253 PT Time Calculation (min) (ACUTE ONLY): 11 min  Charges:  $Therapeutic Activity: 8-22 mins                     Wyona Almas, PT, DPT Acute Rehabilitation Services Pager [phone] Office [phone]    Dylan Shaw 05/28/2021, 11:26 AM

## 2021-05-28 NOTE — Consult Note (Signed)
Palliative Medicine Inpatient Consult Note  Reason for consult:  Goals of Care "Goals of care. metastatic cancer, possible pulmonary source. Biopsy pending. Brain/spine mets. Seizures on AEDs. Severe malnutrition."  HPI:  Per intake H&P -->  Dylan Shaw  is a 55 y.o. male, medical history, but he has not followed with any physician in the past, with history of smoking 1 pack for last 35 years, he presents to ED with multiple complaints, including weight loss, generalized weakness, fatigue, shortness of breath and reported 50 pounds weight loss over last 6 weeks, as well complaining of back pain. He has been hospitalized for four days during which time he has been identified to metastatic lung cancer. He has also had concurrent aphasia in the setting of a focal seizure.   Palliative care was consulted to further address goals of care in the setting of metastatic disease.   Clinical Assessment/Goals of Care:  *Please note that this is a verbal dictation therefore any spelling or grammatical errors are due to the "Dragon Medical One" system interpretation.  I have reviewed medical records including EPIC notes, labs and imaging, received report from bedside RN, assessed the patient who is being changed in shift in his bed.  He has a lot of facial grimacing during this time.    I met with Dylan Shaw and his brother, Dylan Shaw to further discuss diagnosis prognosis, GOC, EOL wishes, disposition and options.   I introduced Palliative Medicine as specialized medical care for people living with serious illness. It focuses on providing relief from the symptoms and stress of a serious illness. The goal is to improve quality of life for both the patient and the family.  Dylan Shaw shares with me that he is from [place], [place].  He has never been married and does not have any children.  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  He shares with me that he does not have any  hobbies other than working.  He has faith though does not ascribe to any specific denomination all values.  Prior to hospitalization Dylan Shaw was living alone in an apartment and able to perform basic activities of daily living. As of  the last few weeks he has not been working due to generalized weakness and overall feeling unwell.  I asked Dylan Shaw what he understands about what is going on with his health at this time. He shares with me that he "does not know much about anything".  I shared with Dylan Shaw that he appears to have cancer which is likely related to his lungs as the primary site and has spread to his spine and brain.  Reviewed that typically this is worrisome for more aggressive form of cancer.  Dylan Shaw expresses understanding.  His brother Dylan interjects that their father died of lung cancer as well.  A detailed discussion was had today regarding advanced directives -Dylan Shaw shares that he has never completed these before though would be willing to do so while in-house.  We reviewed the healthcare power of attorney documents and Dylan Shaw has selected his brother, Dylan to be his healthcare decision maker in the event that he become incoherent.  We completed the living will all three scenarios provided Dylan Shaw shared he would not want his life prolonged in.    Concepts specific to code status, artifical feeding and hydration, continued IV antibiotics and rehospitalization was had.  I completed a MOST form today. The patient and family outlined their wishes for the following treatment decisions:    Cardiopulmonary Resuscitation: Do Not Attempt Resuscitation (DNR/No CPR)  Medical Interventions: Limited Additional Interventions: Use medical treatment, IV fluids and cardiac monitoring as indicated, DO NOT USE intubation or mechanical ventilation. May consider use of less invasive airway support such as BiPAP or CPAP. Also provide comfort measures. Transfer to the hospital if indicated. Avoid intensive  care.   Antibiotics: Antibiotics if indicated  IV Fluids: IV fluids if indicated  Feeding Tube: No feeding tube   The difference between a aggressive medical intervention path  and a palliative comfort care path for this patient at this time was had.  Also noted discussed that he may have a very aggressive form of cancer which is not amenable to curative treatment.  Reviewed that he would have treatment more offered wish to pursue this.  At this point in time we discussed that there is still more information to acquire before further conversations can be had.  I did share with Dylan Shaw that is quite possible his pathology results will reveal a poor prognosis in which case hospice would be a reasonable consideration.  I described hospice as a service for patients for have a life expectancy of < 6 months. It preserves dignity and quality at the end phases of life. The focus changes from curative to symptom relief.   Discussed the importance of continued conversation with family and their  medical providers regarding overall plan of care and treatment options, ensuring decisions are within the context of the patients values and GOCs.  Provided "Hard Choices for Loving People" booklet.   Decision Maker: Dylan Shaw (brother) [phone]  SUMMARY OF RECOMMENDATIONS   DNAR/DNI  MOST Completed, paper copy placed onto the chart electric copy can be found in [facility]  DNR Form Completed, paper copy placed onto the chart electric copy can be found in [facility]  Appreciate Chaplain helping with advance directives (have completed and placed on front of chart to be notarized)  Pathology results are pending to further delineate if any additional treatments can be offered for metastatic disease  Dylan Shaw would be interested in treatments though, he also realizes he may be beyond this point with his present disease process and that treatments may not be curative but may only be palliative  The topic of hospice  has been introduced  Ongoing support in the oncoming days  Code Status/Advance Care Planning: DNAR/DNI   Symptom Management:  Metastatic Pain: - DC Norco - DC Morphine - Continue tylenol - Continue methocarbolmol - Continue oxycodone - Continue Decadron   Palliative Prophylaxis:   Oral care, mobility, delirium precautions  Additional Recommendations (Limitations, Scope, Preferences):  Treat what is treatable Psycho-social/Spiritual:   Desire for further Chaplaincy support:  No  Additional Recommendations:  Education on metastatic disease   Prognosis:  Based on patient's current condition, severely malnourished state, and very severe  frailty he appears to have multisystem failure.  Pathology report is pending which will better aid in further prognosis.  Discharge Planning:  Discharge plan is unclear at this time  Vitals:   05/28/21 0741 05/28/21 1127  BP: (!) 140/92 124/87  Pulse: 91 91  Resp: 18 17  Temp: 98.5 F (36.9 C) 98.5 F (36.9 C)  SpO2: 99% 100%    Intake/Output Summary (Last 24 hours) at 05/28/2021 1356 Last data filed at 05/28/2021 1100 Gross per 24 hour  Intake 920 ml  Output 3350 ml  Net -2430 ml   Last Weight  Most recent update: 05/25/2021  5:40 PM   Weight    46.1 kg (101 lb 10.1 oz)           Gen: Cachectic Caucasian male no acute distress HEENT: moist mucous membranes CV: Regular rate and rhythm PULM: clear to auscultation bilaterally ABD: soft/nontender EXT: (+) muscle wasting  Neuro: Alert and oriented x3  PPS: 20-30%   This conversation/these recommendations were discussed with patient primary care team, Dr. Nettey  Time In: 1600 Time Out: 1710 Total Time: 70 Greater than 50%  of this time was spent counseling and coordinating care related to the above assessment and plan.    [facility] Team Team Cell Phone: [phone] Please utilize secure chat with additional questions, if there is  no response within 30 minutes please call the above phone number  Palliative Medicine Team providers are available by phone from 7am to 7pm daily and can be reached through the team cell phone.  Should this patient require assistance outside of these hours, please call the patient's attending physician.  

## 2021-05-28 NOTE — Progress Notes (Signed)
Pt found lying in urine with condom cath pulled off. Pt did not remember pulling it off and did not realize that he was wet. When asked if he was in pain he said "no", but when offered a pain pill because he said he felt bad he said it might help.   Justice Rocher, RN

## 2021-05-28 NOTE — Progress Notes (Signed)
Pt more confused today compared to yesterday. Disorientedx4 and is having difficulty finding words. Pt has also self-removed 3 IVs in the past 12 hours. Dr. Lonny Prude was made aware and IV Keppra has been changed to oral.   Justice Rocher, RN

## 2021-05-28 NOTE — Progress Notes (Addendum)
Subjective: Patient reports "I'm doing alright." He does report some mild incisional pain. Aphasia persists.  No acute events overnight.   Objective: Vital signs in last 24 hours: Temp:  [97.8 F (36.6 C)-98.4 F (36.9 C)] 98 F (36.7 C) (05/31 0321) Pulse Rate:  [78-90] 82 (05/31 0321) Resp:  [16-18] 18 (05/30 2342) BP: (120-162)/(80-99) 149/99 (05/31 0321) SpO2:  [99 %-100 %] 100 % (05/31 0321)  Intake/Output from previous day: 05/30 0701 - 05/31 0700 In: 1160 [P.O.:360; I.V.:700; IV Piggyback:100] Out: 3300 [Urine:3300] Intake/Output this shift: No intake/output data recorded.  Physical Exam: Patient is awake, AOx3, PERRL, EOMI. Speech is aphasic. MAEW stregth 5/5 BUE and BLE. Reports normal sensation on exam. Dressing is intact with old drainage marked on the superior portion of the dressing. Incision is well approximated with no drainage, erythema, or edema.    Lab Results: Recent Labs    05/25/21 2021 05/28/21 0227  WBC  --  10.3  HGB 9.2* 10.1*  HCT 27.0* 31.7*  PLT  --  327   BMET Recent Labs    05/25/21 2021 05/28/21 0227  NA 136 131*  K 4.1 4.4  CL  --  94*  CO2  --  29  GLUCOSE  --  123*  BUN  --  11  CREATININE  --  0.70  CALCIUM  --  8.9    Studies/Results: MR BRAIN W WO CONTRAST  Result Date: 05/26/2021 CLINICAL DATA:  Abnormality on recent CT head. Concern for metastases. EXAM: MRI HEAD WITHOUT AND WITH CONTRAST TECHNIQUE: Multiplanar, multiecho pulse sequences of the brain and surrounding structures were obtained without and with intravenous contrast. CONTRAST:  76mL GADAVIST GADOBUTROL 1 MMOL/ML IV SOLN COMPARISON:  CT head from the same day. FINDINGS: Brain: Multiple enhancing, heterogeneous and partially calcified metastases, which are measured on series 1100: -5.4 cm lesion in the left frontal lobe (image 226) -3.6 cm in lesion in the right frontal lobe (image 222) -3.7 cm right and 4.0 cm left occipital lesions (image 157) -1.1 cm right  cerebellar lesion (image 110) Associated surrounding edema and mass effect. The left frontal lesion has the greatest mass effect with effacement of the left lateral ventricle and approximately 11 mm of rightward midline shift anteriorly. Curvilinear areas of enhancement in the high left frontal lobe (image 252). Small curvilinear areas of enhancement in the left parietal white matter (images 211 through 213) may represent subacute infarcts given suggestion of diffusion signal abnormality or small vessels. Additional small curvilinear areas of enhancement in the right posterior temporal lobe (image 161) are favored to represent a vessels but warrants attention on follow-up. Evidence of prior hemorrhage in the inferior right basal ganglia, which is nonenhancing. No evidence of acute hemorrhage outside of the metastases or hydrocephalus. Vascular: Major arterial flow voids are maintained at the skull base. Skull and upper cervical spine: Normal marrow signal. Sinuses/Orbits: Clear sinuses.  Unremarkable orbits. Other: No mastoid effusions. IMPRESSION: 1. Enhancing metastases in bilateral frontal lobes, bilateral occipital lobes, and the right cerebellum, as detailed above. Surrounding edema and mass effect with 11 mm of rightward midline shift anteriorly. 2. Small curvilinear areas of enhancement in the left parietal white matter are indeterminate but may represent small subacute infarcts given suggestion of diffusion signal abnormality or small vessels. Recommend attention on short interval follow-up to exclude additional metastases. The 3. Nonenhancing small prior hemorrhage in the inferior right basal ganglia. Electronically Signed   By: Margaretha Sheffield MD   On: 05/26/2021 20:34  EEG adult  Result Date: 05/26/2021 Derek Jack, MD     05/26/2021  8:07 PM Routine EEG Report Dylan Shaw is a 55 y.o. male with a history of metastatic cancer to the brain who is undergoing an EEG to evaluate for seizures.  Report: This EEG was acquired with electrodes placed according to the International 10-20 electrode system (including Fp1, Fp2, F3, F4, C3, C4, P3, P4, O1, O2, T3, T4, T5, T6, A1, A2, Fz, Cz, Pz). The following electrodes were missing or displaced: none. The occipital dominant rhythm was 7-8 Hz. This activity is reactive to stimulation. Drowsiness was manifested by background fragmentation; deeper stages of sleep were not identified. There was focal slowing over the left frontal > right frontal regions. There were no interictal epileptiform discharges. There were no electrographic seizures identified. There was no abnormal response to photic stimulation or hyperventilation. Impression and clinical correlation: This EEG was obtained while awake and drowsy and is abnormal due to mild diffuse slowing indicating mild global cerebral dysfunction and focal slowing over the left>right frontal regions indicating superimposed focal cerebral dysfunction in those regions. Su Monks, MD [facility] [phone] If 7pm- 7am, please page neurology on call as listed in [facility].   CT HEAD CODE STROKE WO CONTRAST  Result Date: 05/26/2021 CLINICAL DATA:  Code stroke.  Neuro deficit with stroke suspected EXAM: CT HEAD WITHOUT CONTRAST TECHNIQUE: Contiguous axial images were obtained from the base of the skull through the vertex without intravenous contrast. COMPARISON:  None. FINDINGS: Brain: Bilateral occipital and bilateral frontal partially calcified brain masses consistent with metastatic disease. The largest is in the left frontal region at approximately 5 cm on axial slices. Moderate vasogenic edema greatest around the largest lesions. There is local mass effect in the areas of tumoral thickening and brain edema. No superimposed infarct is seen. High-density areas appear most like mineralization. No hydrocephalus. Vascular: Negative Skull: No noted bony metastasis Sinuses/Orbits: Negative Other: These results  were called by telephone at the time of interpretation on 05/26/2021 at 11:48 am to provider [facility] , who is already aware IMPRESSION: At least 4 large brain metastases measuring up to 5 cm in the left frontal lobe. Associated vasogenic edema and regional mass effect. Electronically Signed   By: Monte Fantasia M.D.   On: 05/26/2021 11:52    Assessment/Plan: 55 y.o. man w/ new metastatic disease who is post op day #3 s/p thoracolumbar decompression and fusion. Now with expressive and receptive aphasia. 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 CTH revealed 4 large lesions. Continue working on pain control, mobility and ambulating patient.   - Patient will likely need Hospice. - Continue Keppra 500 mg BID - Continue Decadron 4 mg Q6H    LOS: 4 days     Marvis Moeller, DNP, NP-C 05/28/2021, 7:38 AM   Later in the day, the patient was speaking better with confusion as to age (stated 8, agreed he is 42).  He stated his name, that he has tumors in his brain and knows he is in hospital.  He does state he is no longer incontinent of bowel and bladder.  He is following commands.  Prognosis, given extensive intracranial disease, is poor.  I would recommend palliative care and consider Hospice, depending on Pathology results from prior spinal tumor resection.

## 2021-05-28 NOTE — Telephone Encounter (Signed)
Patients brother Herbie [place] called to find out what the plan was for his brother.  Per Dr Lorenso Courier advised that plan is on hold until pathology gets back.  Advised brother and he stated understanding.

## 2021-05-29 ENCOUNTER — Ambulatory Visit
Admit: 2021-05-29 | Discharge: 2021-05-29 | Disposition: A | Discharge status: Home / Self Care | Payer: 59 | Attending: Radiation Oncology | Admitting: Radiation Oncology

## 2021-05-29 DIAGNOSIS — C7951 Secondary malignant neoplasm of bone: Secondary | ICD-10-CM

## 2021-05-29 DIAGNOSIS — C7931 Secondary malignant neoplasm of brain: Secondary | ICD-10-CM

## 2021-05-29 DIAGNOSIS — C3431 Malignant neoplasm of lower lobe, right bronchus or lung: Secondary | ICD-10-CM

## 2021-05-29 LAB — BPAM RBC
Blood Product Expiration Date: 202206232359
Blood Product Expiration Date: 202206232359
Unit Type and Rh: 7300
Unit Type and Rh: 7300

## 2021-05-29 LAB — TYPE AND SCREEN
ABO/RH(D): B POS
Antibody Screen: NEGATIVE
Unit division: 0
Unit division: 0

## 2021-05-29 MED ORDER — PROSOURCE PLUS PO LIQD
30.0000 mL | Freq: Two times a day (BID) | ORAL | Status: DC
  Administered 2021-05-29 – 2021-06-26 (×51): 30 mL via ORAL
  Filled 2021-05-29 (×49): qty 30

## 2021-05-29 NOTE — Progress Notes (Signed)
Subjective: Patient reports that he is doing well this morning. He denies low back pain. Aphasia persists, although slightly improved from yesterday.  No acute events overnight.   Objective: Vital signs in last 24 hours: Temp:  [98 F (36.7 C)-98.7 F (37.1 C)] 98.2 F (36.8 C) (06/01 0330) Pulse Rate:  [81-96] 87 (06/01 0330) Resp:  [16-18] 18 (06/01 0330) BP: (124-162)/(68-103) 143/95 (06/01 0330) SpO2:  [97 %-100 %] 97 % (06/01 0330)  Intake/Output from previous day: 05/31 0701 - 06/01 0700 In: -  Out: 700 [Urine:700] Intake/Output this shift: No intake/output data recorded.   Physical Exam: Patient is awake, alert, and O X self only. PERRL, EOMI. Speech is aphasic. MAEW strength 5/5 BUE and BLE. Reports normal sensation on exam. Dressing is intact with old drainage marked on the superior portion of the dressing. Incision is well approximated with no drainage, erythema, or edema.  Lab Results: Recent Labs    05/28/21 0227  WBC 10.3  HGB 10.1*  HCT 31.7*  PLT 327   BMET Recent Labs    05/28/21 0227  NA 131*  K 4.4  CL 94*  CO2 29  GLUCOSE 123*  BUN 11  CREATININE 0.70  CALCIUM 8.9    Studies/Results: No results found.  Assessment/Plan: 55 y.o. man w/ new metastatic disease who is post op day #4 s/p thoracolumbar decompression and fusion. Now with expressive and receptive aphasia. 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 CTH revealed 4 large lesions. Neuro examination continues to wax and wane. Palliative care referral has been made. Awaiting pathology results. The patient's overall prognosis is poor given the extent of his intracranial disease. Continue working on pain control, mobility and ambulating patient.    - Continue Keppra 500 mg BID - Continue Decadron 4 mg Q6H   LOS: 5 days     Marvis Moeller, DNP, NP-C 05/29/2021, 7:45 AM

## 2021-05-29 NOTE — Progress Notes (Addendum)
Physical Therapy Treatment Patient Details Name: Dylan Shaw MRN: [id number] DOB: 05-04-1966 Today's Date: 05/29/2021    History of Present Illness 55 y.o. man w/ new metastatic disease s/p thoracolumbar decompression and fusion. Now w/ expressive aphasia 5/29 , self-limited, likely focal seizure. PMH met CA with primary site Lungs, smoker, 50 pound weight loss    PT Comments    Pt progressing towards his mobility goals; he denies pain at rest, but endorses back discomfort during ambulation when specifically asked. Pt continues with confusion; displays decreased orientation, awareness, and memory. He is pleasant and follows all commands. Pt ambulating limited hallway distances with a walker at a min assist level. Continues to present as a high fall risk based on decreased safety awareness, decreased gait speed and balance deficits. Continue to recommend SNF for ongoing Physical Therapy.      Follow Up Recommendations  SNF     Equipment Recommendations  Rolling walker with 5" wheels;3in1 (PT)    Recommendations for Other Services       Precautions / Restrictions Precautions Precautions: Back Precaution Booklet Issued: Yes (comment) Precaution Comments: handout in room, posey belt, wrist restraints Restrictions Weight Bearing Restrictions: No    Mobility  Bed Mobility Overal bed mobility: Needs Assistance Bed Mobility: Rolling;Sidelying to Sit Rolling: Supervision Sidelying to sit: Supervision       General bed mobility comments: Supervision for safety, no physical assist required    Transfers Overall transfer level: Needs assistance Equipment used: None Transfers: Sit to/from Stand Sit to Stand: Min guard         General transfer comment: Min guard to rise to stand from edge of bed  Ambulation/Gait Ambulation/Gait assistance: Min assist Gait Distance (Feet): 200 Feet Assistive device: Rolling walker (2 wheeled);None Gait Pattern/deviations: Step-through  pattern;Scissoring;Drifts right/left Gait velocity: decreased   General Gait Details: Requiring minA for stability, impulsive, needs cues for self monitoring as he leaned down onto forearms on walker at one point and for upward gaze.   Stairs             Wheelchair Mobility    Modified Rankin (Stroke Patients Only) Modified Rankin (Stroke Patients Only) Pre-Morbid Rankin Score: No symptoms Modified Rankin: Moderately severe disability     Balance Overall balance assessment: Needs assistance Sitting-balance support: Feet supported Sitting balance-Leahy Scale: Fair     Standing balance support: During functional activity;No upper extremity supported Standing balance-Leahy Scale: Fair Standing balance comment: Fair statically                            Cognition Arousal/Alertness: Awake/alert Behavior During Therapy: WFL for tasks assessed/performed Overall Cognitive Status: Impaired/Different from baseline Area of Impairment: Orientation;Memory;Safety/judgement;Awareness                 Orientation Level: Disoriented to;Time;Situation;Place   Memory: Decreased short-term memory;Decreased recall of precautions   Safety/Judgement: Decreased awareness of deficits Awareness: Intellectual   General Comments: follows all 1 step commands, poor awareness and STM      Exercises      General Comments        Pertinent Vitals/Pain Pain Assessment: Faces Faces Pain Scale: Hurts little more Pain Location: back Pain Descriptors / Indicators: Operative site guarding;Grimacing Pain Intervention(s): Monitored during session    Home Living                      Prior Function  PT Goals (current goals can now be found in the care plan section) Acute Rehab PT Goals Patient Stated Goal: to return home PT Goal Formulation: With patient Time For Goal Achievement: 06/10/21 Potential to Achieve Goals: Good Progress towards PT goals:  Progressing toward goals    Frequency    Min 5X/week      PT Plan Current plan remains appropriate    Co-evaluation              AM-PAC PT "6 Clicks" Mobility   Outcome Measure  Help needed turning from your back to your side while in a flat bed without using bedrails?: A Little Help needed moving from lying on your back to sitting on the side of a flat bed without using bedrails?: A Little Help needed moving to and from a bed to a chair (including a wheelchair)?: A Little Help needed standing up from a chair using your arms (e.g., wheelchair or bedside chair)?: A Little Help needed to walk in hospital room?: A Little Help needed climbing 3-5 steps with a railing? : A Lot 6 Click Score: 17    End of Session Equipment Utilized During Treatment: Gait belt Activity Tolerance: Patient tolerated treatment well Patient left: in chair;with call bell/phone within reach;with chair alarm set;with restraints reapplied Nurse Communication: Mobility status PT Visit Diagnosis: Unsteadiness on feet (R26.81)     Time: 3875-6433 PT Time Calculation (min) (ACUTE ONLY): 20 min  Charges:  $Therapeutic Activity: 8-22 mins                     Wyona Almas, PT, DPT Acute Rehabilitation Services Pager [phone] Office [phone]    [name] 05/29/2021, 10:41 AM

## 2021-05-29 NOTE — Consult Note (Signed)
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 effect. Electronically Signed   By: Monte Fantasia M.D.   On: 05/26/2021 11:52       IMPRESSION/PLAN: 1. 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 Dr. Lisbeth Renshaw has reviewed the findings from his imaging and workup to date. He does have malignant findings by pathology and his histology is expected to be adenocarcinoma when IHC is available. He has bulky brain disease as well as disease in the spine that has been resected and stabilized. Dr. Lisbeth Renshaw would offer palliative treatment to the spine in a few weeks following his healing. Regarding his brain, Dr. Lisbeth Renshaw would encourage continuation of steroids to suppress inflammation of his brain from the presence of his bulky brain disease. While sterotactic radiosurgery would be a consideration, the size and volume of his cancer would require does de-escalation in order to cover the site, which would limit abilities to treat more diffuse disease. The alterative would be to consider whole brain radiotherapy over 2 weeks with hopes of improving his burden of disease and enhance quality of life, versus a 1 week palliative course of whole brain prior to considering hospice care . We discussed the risks, benefits, short, and long term  effects of radiotherapy, as well as the palliative intent, and the patient is unable to decide how he'd like to proceed, but his brother was involved in his recent meeting with palliative care, and at that time the patient indicated he was interested in trying therapy. At  the conclusion of our conversation, the patient's brother is interested in attempting whole brain radiotherapy, knowing that we ma have limitations in the ability to administer treatment, but that if disease improves, there may be a role for salvage stereotactic radiosurgery [facility]) if needed. We would revisit options of palliative radiotherapy to the spine as well to see how he responds over the course of radiotherapy to the brain. He would need outpatient PET imaging and his ability to participate in his decision making would influence his ability to receive systemic treatment too. For now his brother gives verbal consent to proceed with whole brain radiotherapy. We will attempt to proceed with simulation in the next day or so. We anticipate he will need skilled placement as well.   In a visit lasting 60 minutes, greater than 50% of the time was spent by phone and in floor time discussing the patient's condition, in preparation for the discussion, and coordinating the patient's care.     Carola Rhine, [facility]   **Disclaimer: This note was dictated with voice recognition software. Similar sounding words can inadvertently be transcribed and this note may contain transcription errors which may not have been corrected upon publication of note.**

## 2021-05-29 NOTE — Progress Notes (Signed)
PROGRESS NOTE    Dylan Shaw  EAV:[id number] DOB: 1966-06-01 DOA: 05/24/2021 PCP: Patient, No Pcp Per (Inactive)   Brief Narrative: Dylan Shaw is a 55 y.o. male with a history of smoking presented secondary to 50 pound weight loss, fatigue and low back pain. He was found to have evidence of a lung and spine mass concerning for metastatic cancer. He was started on decadron. During hospitalization he developed urinary and bowel incontinence leading to neurosurgical management and surgical decompression of the spine.  Medical/radiation oncology have seen with plans to treat.  Surgical pathology pending.   Assessment & Plan:   Active Problems:   Thoracic spine tumor   Lung cancer ([place])   Protein calorie malnutrition ([place])   Metastatic cancer to spine ([place])   Brain metastasis ([place])   Lung mass, T12 soft tissue mass, Concern for pulmonary malignancy with metastasis to spine and brain: Radiation/medical oncology consulted. Pulmonology initially consulted for pulmonary biopsy, I communicated with Dr. Chase Caller, PCCM on 6/1 who advised that they will be involved for EBUS if biopsy of brain/spine is nondiagnostic. Neurosurgery consulted for spine mass. Patient underwent partial vertebrectomy and laminectomy with resection of mass on 5/28 by neurosurgery with specimen obtained for biopsy. MRI confirmed metastatic brain lesions with associated edema and 11 mm rightward midline shift anteriorly. PT/OT recommending SNF -Medical oncology recommendations: Plan for chemotherapy -Radiation oncology recommendations: Decadron. Plan for reevaluation on 5/31-not sure if this happened, will reach out to Dr. Isidore Moos -Neurosurgery recommendations: continue Decadron and Keppra; recommending hospice as likely disposition -Pulmonology recommendations: Signed off-please see above. -Palliative care consult from 5/31 appreciated: DNR/DNI established, MOST form completed, patient would be interested in cancer  treatment understanding that this will be palliative rather than curative.  Aphasia Focal seizure Code stroke called on 5/29 with negative CT and MRI for stroke. EEG ordered and without evidence of seizure activity. 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 Started on [place] and other seizure restrictions to be advised when able.  Acute encephalopathy Likely multifactorial but most importantly related to brain mets with mass-effect, steroid use and seizures with postictal phase.  Currently on bilateral wrist restraints and Posey belt.  Delirium precautions.  Minimize opioid or sedative use.  Severe malnutrition Dietitian consulted. Underweight.  As evidenced by bitemporal wasting and diffuse skeletal mass wasting, BMI of 15.92 and an albumin of 2.7.  Tobacco abuse Concern for possible COPD. Counseled.  Underweight Body mass index is 15.92 kg/m.   Elevated blood pressures As needed oral hydralazine.  Anemia of chronic disease Stable.  Follow CBC.   DVT prophylaxis: SCDs, Heparin subq Code Status:   Code Status: DNR Family Communication: None at bedside Disposition Plan: Discharge to SNF in several days. Will likely require transfer to [facility] prior to discharge for radiation oncology management   Consultants:   Medical oncology  Radiation oncology  Pulmonology  Neurosurgery  Neurology  Procedures:  1. PLACEMENT OF PERCUTANEOUS PEDICLE SCREWS T 10 - L 2 AND RESECTION OF THORACIC SPINE MASS with partial vertebrectomy and laminectomy with resection of epidural metastasis and decompression of thoracic spinal cord (05/25/2021)  Antimicrobials:  None    Subjective: Patient alert and oriented only to self.  Reports that he is in "[place]".  As per discussion with nursing, has pulled out multiple IVs and no IV access currently.  Unclear regarding amount of oral intake-requested documentation.  No other acute issues  reported.  Objective: Vitals:   05/28/21 2316 05/29/21 0330 05/29/21  0746 05/29/21 1152  BP: (!) 162/103 (!) 143/95 (!) 150/96 113/85  Pulse: 81 87 87 (!) 106  Resp: 16 18 17 17   Temp: 98.7 F (37.1 C) 98.2 F (36.8 C) 98 F (36.7 C) 98.2 F (36.8 C)  TempSrc: Oral Oral    SpO2: 100% 97% 100% 100%  Weight:      Height:        Intake/Output Summary (Last 24 hours) at 05/29/2021 1222 Last data filed at 05/29/2021 0900 Gross per 24 hour  Intake 400 ml  Output 450 ml  Net -50 ml   Filed Weights   05/24/21 2208 05/25/21 0601 05/25/21 1737  Weight: 46.1 kg 46.1 kg 46.1 kg    Examination:  General exam: Middle-age male, looks older than stated age, moderately built, frail, chronically ill looking and cachectic with bitemporal and diffuse muscle wasting.  Does not appear in any distress.  On room air. Respiratory system: Clear to auscultation.  No increased work of breathing. Cardiovascular system: S1 and S2 heard, RRR.  No JVD, murmurs or pedal edema.  Not on telemetry. Gastrointestinal system: Abdomen is nondistended, soft and nontender. No organomegaly or masses felt. Normal bowel sounds heard. Central nervous system: Alert and oriented to self only.  Suspect both receptive and expressive aphasia. Musculoskeletal: No edema. No calf tenderness.  Bilateral wrist soft restraints and Posey belt. Skin: No cyanosis. No rashes. Thoracic incision honeycomb dressing with some old blood saturation-as examined by prior MD, did not examine today. Psychiatry: Judgement and insight impaired. Memory impaired.   Data Reviewed: I have personally reviewed following labs and imaging studies  CBC Lab Results  Component Value Date   WBC 10.3 05/28/2021   RBC 3.63 (L) 05/28/2021   HGB 10.1 (L) 05/28/2021   HCT 31.7 (L) 05/28/2021   MCV 87.3 05/28/2021   MCH 27.8 05/28/2021   PLT 327 05/28/2021   MCHC 31.9 05/28/2021   RDW 13.2 05/28/2021   LYMPHSABS 1.3 05/24/2021   MONOABS 0.7 05/24/2021    EOSABS 0.0 05/24/2021   BASOSABS 0.0 47/65/4650     Last metabolic panel Lab Results  Component Value Date   NA 131 (L) 05/28/2021   K 4.4 05/28/2021   CL 94 (L) 05/28/2021   CO2 29 05/28/2021   BUN 11 05/28/2021   CREATININE 0.70 05/28/2021   GLUCOSE 123 (H) 05/28/2021   GFRNONAA >60 05/28/2021   CALCIUM 8.9 05/28/2021   PROT 7.7 05/24/2021   ALBUMIN 2.7 (L) 05/24/2021   BILITOT 0.9 05/24/2021   ALKPHOS 92 05/24/2021   AST 15 05/24/2021   ALT 11 05/24/2021   ANIONGAP 8 05/28/2021    CBG (last 3)  No results for input(s): GLUCAP in the last 72 hours.   GFR: Estimated Creatinine Clearance: 69.6 mL/min (by C-G formula based on SCr of 0.7 mg/dL).  Coagulation Profile: No results for input(s): INR, PROTIME in the last 168 hours.  Recent Results (from the past 240 hour(s))  Resp Panel by RT-PCR (Flu A&B, Covid) Nasopharyngeal Swab     Status: None   Collection Time: 05/24/21 11:51 AM   Specimen: Nasopharyngeal Swab; Nasopharyngeal(NP) swabs in vial transport medium  Result Value Ref Range Status   SARS Coronavirus 2 by RT PCR NEGATIVE NEGATIVE Final    Comment: (NOTE) SARS-CoV-2 target nucleic acids are NOT DETECTED.  The SARS-CoV-2 RNA is generally detectable in upper respiratory specimens during the acute phase of infection. The lowest concentration of SARS-CoV-2 viral copies this assay can detect is 138  copies/mL. A negative result does not preclude SARS-Cov-2 infection and should not be used as the sole basis for treatment or other patient management decisions. A negative result may occur with  improper specimen collection/handling, submission of specimen other than nasopharyngeal swab, presence of viral mutation(s) within the areas targeted by this assay, and inadequate number of viral copies(<138 copies/mL). A negative result must be combined with clinical observations, patient history, and epidemiological information. The expected result is  Negative.  Fact Sheet for Patients:  EntrepreneurPulse.com.au  Fact Sheet for Healthcare Providers:  IncredibleEmployment.be  This test is no t yet approved or cleared by the [place] FDA and  has been authorized for detection and/or diagnosis of SARS-CoV-2 by FDA under an Emergency Use Authorization (EUA). This EUA will remain  in effect (meaning this test can be used) for the duration of the COVID-19 declaration under Section 564(b)(1) of the Act, 21 U.S.C.section 360bbb-3(b)(1), unless the authorization is terminated  or revoked sooner.       Influenza A by PCR NEGATIVE NEGATIVE Final   Influenza B by PCR NEGATIVE NEGATIVE Final    Comment: (NOTE) The Xpert Xpress SARS-CoV-2/FLU/RSV plus assay is intended as an aid in the diagnosis of influenza from Nasopharyngeal swab specimens and should not be used as a sole basis for treatment. Nasal washings and aspirates are unacceptable for Xpert Xpress SARS-CoV-2/FLU/RSV testing.  Fact Sheet for Patients: EntrepreneurPulse.com.au  Fact Sheet for Healthcare Providers: IncredibleEmployment.be  This test is not yet approved or cleared by the [place] FDA and has been authorized for detection and/or diagnosis of SARS-CoV-2 by FDA under an Emergency Use Authorization (EUA). This EUA will remain in effect (meaning this test can be used) for the duration of the COVID-19 declaration under Section 564(b)(1) of the Act, 21 U.S.C. section 360bbb-3(b)(1), unless the authorization is terminated or revoked.  Performed at [facility], [address], [place], [place] [address]   Surgical pcr screen     Status: None   Collection Time: 05/25/21  6:02 PM   Specimen: Nasal Mucosa; Nasal Swab  Result Value Ref Range Status   MRSA, PCR NEGATIVE NEGATIVE Final   Staphylococcus aureus NEGATIVE NEGATIVE Final    Comment: (NOTE) The Xpert SA Assay (FDA approved for  NASAL specimens in patients 66 years of age and older), is one component of a comprehensive surveillance program. It is not intended to diagnose infection nor to guide or monitor treatment. Performed at [facility], [place] [address] [place], [place] [address]         Radiology Studies: No results found.      Scheduled Meds: . dexamethasone (DECADRON) injection  4 mg Intravenous Q6H   Or  . dexamethasone  4 mg Oral Q6H  . docusate sodium  100 mg Oral BID  . feeding supplement  237 mL Oral TID BM  . heparin  5,000 Units Subcutaneous Q8H  . levETIRAcetam  500 mg Oral BID  . pantoprazole  40 mg Oral QHS  . sodium chloride flush  3 mL Intravenous Q12H   Continuous Infusions: . sodium chloride    . sodium chloride    . dextrose 5 % and 0.45 % NaCl with KCl 20 mEq/L 75 mL/hr at 05/27/21 1215  . methocarbamol (ROBAXIN) IV       LOS: 5 days    Vernell Leep, MD, [place], [facility]. [facility]  To contact the attending provider between 7A-7P or the covering provider during after hours 7P-7A, please log  into the web site [email or web address] and access using universal [place] password for that web site. If you do not have the password, please call the hospital operator.

## 2021-05-29 NOTE — Progress Notes (Signed)
Nutrition Follow-up  DOCUMENTATION CODES:   Underweight,Severe malnutrition in context of chronic illness  INTERVENTION:   - Continue Ensure Enlive po TID, each supplement provides 350 kcal and 20 grams of protein  - Add Magic Cup TID with meals, each supplement provides 290 kcal and 9 grams of protein  - Add ProSource Plus 30 ml po BID, each supplement provides 100 kcal and 15 grams of protein  - Encourage PO intake and provide feeding assistance as needed  NUTRITION DIAGNOSIS:   Severe Malnutrition related to chronic illness (lung cancer with metastasis to spine and brain) as evidenced by severe muscle depletion,severe fat depletion.  New diagnosis after completion of NFPE  GOAL:   Patient will meet greater than or equal to 90% of their needs  Progressing  MONITOR:   PO intake,Supplement acceptance,Weight trends,Labs,I & O's  REASON FOR ASSESSMENT:   Consult Assessment of nutrition requirement/status  ASSESSMENT:   Patient with PMH significant for tobacco use. Presents this admission with T12 metastatic lesion with epidural extension (presumed lung primary).  5/28 - s/p placement of pedicle screws T10-L2 and resection of thoracic spine mass  Pt with primary lung cancer with spinal metastasis and newly found brain metastasis. Per notes, treatment at this time would be palliative rather than curative. Palliative Care is following pt. Per Palliative Care note on 5/31, no feeding tube.  Pt on a regular diet at this time with 20-100% meal completions charted. Discussed pt with RN who reports morning Ensure was provided to pt but RN unsure if pt consumed the supplement and it was not in the room at time of RD visit.  Spoke with pt at bedside. Pt is in wrist restraints. Pt unable to provide much history due to expressive aphasia. Pt reports that he did eat today but unable to identify what he consumed. Pt reports that he did drink all of the Ensure supplement.  In addition  to Ensure TID, RD to order Magic Cups TID with meals and ProSource Plus BID to maximize kcal and protein consumed via PO route since a feeding tube is not within pt's GOC and pt is severely malnourished.  Medications reviewed and include: decadron, colace, Ensure Enlive TID, protonix, keppra  Labs reviewed: sodium 131  NUTRITION - FOCUSED PHYSICAL EXAM:  Flowsheet Row Most Recent Value  Orbital Region Severe depletion  Upper Arm Region Severe depletion  Thoracic and Lumbar Region Severe depletion  Buccal Region Severe depletion  Temple Region Severe depletion  Clavicle Bone Region Severe depletion  Clavicle and Acromion Bone Region Severe depletion  Scapular Bone Region Severe depletion  Dorsal Hand Severe depletion  Patellar Region Severe depletion  Anterior Thigh Region Severe depletion  Posterior Calf Region Severe depletion  Edema (RD Assessment) None  Hair Reviewed  Eyes Reviewed  Mouth Reviewed  Skin Reviewed  Nails Reviewed       Diet Order:   Diet Order            Diet regular Room service appropriate? No; Fluid consistency: Thin  Diet effective now                 EDUCATION NEEDS:   Not appropriate for education at this time  Skin:  Skin Assessment: Skin Integrity Issues: Incisions: back  Last BM:  no documented BM  Height:   Ht Readings from Last 1 Encounters:  05/25/21 5\' 7"  (1.702 m)    Weight:   Wt Readings from Last 1 Encounters:  05/25/21 46.1 kg  BMI:  Body mass index is 15.92 kg/m.  Estimated Nutritional Needs:   Kcal:  1500-1700 kcal  Protein:  75-90 grams  Fluid:  >/= 1.5 L/day    Gustavus Bryant, MS, RD, LDN Inpatient Clinical Dietitian Please see AMiON for contact information.

## 2021-05-29 NOTE — Progress Notes (Signed)
This chaplain understands the PMT completed Pt. education and the Pt. Advance Directive is filled out, waiting to be notarized.  The notary is not available today.   The chaplain will F/U when the service is available.

## 2021-05-29 NOTE — Plan of Care (Signed)
  Problem: Education: Goal: Knowledge of General Education information will improve Description: Including pain rating scale, medication(s)/side effects and non-pharmacologic comfort measures Outcome: Progressing   Problem: Clinical Measurements: Goal: Ability to maintain clinical measurements within normal limits will improve Outcome: Progressing   Problem: Activity: Goal: Risk for activity intolerance will decrease Outcome: Progressing   Problem: Nutrition: Goal: Adequate nutrition will be maintained Outcome: Progressing   Problem: Coping: Goal: Level of anxiety will decrease Outcome: Progressing   Problem: Pain Managment: Goal: General experience of comfort will improve Outcome: Progressing   Problem: Safety: Goal: Ability to remain free from injury will improve Outcome: Progressing   Problem: Skin Integrity: Goal: Risk for impaired skin integrity will decrease Outcome: Progressing   Problem: Safety: Goal: Non-violent Restraint(s) Outcome: Progressing

## 2021-05-29 NOTE — Progress Notes (Signed)
Occupational Therapy Treatment Patient Details Name: Dylan Shaw MRN: [id number] DOB: [birth year] Today's Date: 05/29/2021    History of present illness 55 y.o. man w/ new metastatic disease s/p thoracolumbar decompression and fusion. Now w/ expressive aphasia 5/29 , self-limited, likely focal seizure. PMH met CA with primary site Lungs, smoker, 50 pound weight loss   OT comments  Pt progressing to OOB ADL with RW for mobility. Pt minguardA for mobility with RW and cues for back precautions moderately throughout session. Pt minA for ADL at commode and minguardA with 1 LOB episode at sink requiring minA for stability. Pt with poor awareness of deficits and expressive communication deficit noted as pt stating "yes and no" for similar questions. Pt would greatly benefit from continued OT skilled services. OT following acutely.   Follow Up Recommendations  SNF    Equipment Recommendations  3 in 1 bedside commode    Recommendations for Other Services      Precautions / Restrictions Precautions Precautions: Back Precaution Booklet Issued: Yes (comment) Precaution Comments: handout in room, posey belt, wrist restraints Restrictions Weight Bearing Restrictions: No       Mobility Bed Mobility Overal bed mobility: Needs Assistance Bed Mobility: Sit to Sidelying         Sit to sidelying: Min guard General bed mobility comments: assist for BLE movement    Transfers Overall transfer level: Needs assistance Equipment used: None Transfers: Sit to/from Stand Sit to Stand: Min guard         General transfer comment: Min guard to rise to stand from edge of bed    Balance Overall balance assessment: Needs assistance Sitting-balance support: Feet supported Sitting balance-Leahy Scale: Fair     Standing balance support: During functional activity;Single extremity supported Standing balance-Leahy Scale: Poor Standing balance comment: requires assist for dynamic balance tasks                            ADL either performed or assessed with clinical judgement   ADL Overall ADL's : Needs assistance/impaired     Grooming: Supervision/safety;Standing;Wash/dry hands;Wash/dry face;Oral care Grooming Details (indicate cue type and reason): standing at sink; single UE supported             Lower Body Dressing: Min guard Lower Body Dressing Details (indicate cue type and reason): able to figure 4 cross and don socks Toilet Transfer: Min guard;RW;BSC;Ambulation   Toileting- Clothing Manipulation and Hygiene: Minimal assistance;Cueing for safety;Cueing for sequencing;Sitting/lateral lean;Sit to/from stand Toileting - Clothing Manipulation Details (indicate cue type and reason): assist for posterior pericare     Functional mobility during ADLs: Minimal assistance;Rolling walker General ADL Comments: Pt performing LB dressing in bed, grooming, toielt hygiene in standing in bathroom. Pt     Vision       Perception     Praxis      Cognition Arousal/Alertness: Awake/alert Behavior During Therapy: WFL for tasks assessed/performed Overall Cognitive Status: Impaired/Different from baseline Area of Impairment: Orientation;Memory;Safety/judgement;Awareness                 Orientation Level: Disoriented to;Time;Situation;Place   Memory: Decreased short-term memory;Decreased recall of precautions   Safety/Judgement: Decreased awareness of deficits Awareness: Intellectual   General Comments: Pt saying "yes" and "no" inconsistently for answers. Pt saying yes/no for having blue eyes (which he has). pt following simple commands with minimal cues.        Exercises     Shoulder Instructions  General Comments      Pertinent Vitals/ Pain       Pain Assessment: Faces Faces Pain Scale: Hurts little more Pain Location: back Pain Descriptors / Indicators: Operative site guarding;Grimacing Pain Intervention(s): Monitored during  session;Premedicated before session;Repositioned  Home Living                                          Prior Functioning/Environment              Frequency  Min 2X/week        Progress Toward Goals  OT Goals(current goals can now be found in the care plan section)  Progress towards OT goals: Progressing toward goals  Acute Rehab OT Goals Patient Stated Goal: to return home OT Goal Formulation: Patient unable to participate in goal setting Time For Goal Achievement: 06/10/21 Potential to Achieve Goals: Good  Plan Discharge plan remains appropriate    Co-evaluation                 AM-PAC OT "6 Clicks" Daily Activity     Outcome Measure   Help from another person eating meals?: A Little Help from another person taking care of personal grooming?: A Little Help from another person toileting, which includes using toliet, bedpan, or urinal?: A Little Help from another person bathing (including washing, rinsing, drying)?: A Little Help from another person to put on and taking off regular upper body clothing?: A Little Help from another person to put on and taking off regular lower body clothing?: A Little 6 Click Score: 18    End of Session Equipment Utilized During Treatment: Gait belt;Rolling walker  OT Visit Diagnosis: Unsteadiness on feet (R26.81);Muscle weakness (generalized) (M62.81);Cognitive communication deficit (R41.841)   Activity Tolerance Patient tolerated treatment well   Patient Left in chair;with call bell/phone within reach;with chair alarm set   Nurse Communication Mobility status;Precautions        Time: 0930-1008 OT Time Calculation (min): 38 min  Charges: OT General Charges $OT Visit: 1 Visit OT Treatments $Self Care/Home Management : 23-37 mins $Therapeutic Activity: 8-22 mins  Jefferey Pica, OTR/L Acute Rehabilitation Services Pager: [phone] Office: [phone]     Shanikia Kernodle C 05/29/2021, 5:55 PM

## 2021-05-30 ENCOUNTER — Ambulatory Visit: Payer: 59 | Admitting: Radiation Oncology

## 2021-05-30 ENCOUNTER — Encounter: Payer: Self-pay | Admitting: Internal Medicine

## 2021-05-30 DIAGNOSIS — E43 Unspecified severe protein-calorie malnutrition: Secondary | ICD-10-CM | POA: Diagnosis present

## 2021-05-30 LAB — CBC
HCT: 32.7 % — ABNORMAL LOW (ref 39.0–52.0)
Hemoglobin: 10.8 g/dL — ABNORMAL LOW (ref 13.0–17.0)
MCH: 28 pg (ref 26.0–34.0)
MCHC: 33 g/dL (ref 30.0–36.0)
MCV: 84.7 fL (ref 80.0–100.0)
Platelets: 382 10*3/uL (ref 150–400)
RBC: 3.86 MIL/uL — ABNORMAL LOW (ref 4.22–5.81)
RDW: 13.4 % (ref 11.5–15.5)
WBC: 10.1 10*3/uL (ref 4.0–10.5)
nRBC: 0 % (ref 0.0–0.2)

## 2021-05-30 LAB — BASIC METABOLIC PANEL
Anion gap: 11 (ref 5–15)
BUN: 20 mg/dL (ref 6–20)
CO2: 27 mmol/L (ref 22–32)
Calcium: 9.1 mg/dL (ref 8.9–10.3)
Chloride: 93 mmol/L — ABNORMAL LOW (ref 98–111)
Creatinine, Ser: 0.5 mg/dL — ABNORMAL LOW (ref 0.61–1.24)
GFR, Estimated: >60 mL/min (ref >60)
Glucose, Bld: 104 mg/dL — ABNORMAL HIGH (ref 70–99)
Potassium: 4.2 mmol/L (ref 3.5–5.1)
Sodium: 131 mmol/L — ABNORMAL LOW (ref 135–145)

## 2021-05-30 NOTE — Progress Notes (Addendum)
Subjective: Patient reports that he is having a moderate amount of mid back and incisional pain. No acute events overnight.   Objective: Vital signs in last 24 hours: Temp:  [97.7 F (36.5 C)-98.2 F (36.8 C)] 97.8 F (36.6 C) (06/02 0748) Pulse Rate:  [82-108] 108 (06/02 0748) Resp:  [16-18] 16 (06/02 0748) BP: (113-158)/(78-85) 115/82 (06/02 0748) SpO2:  [95 %-100 %] 97 % (06/02 0748)  Intake/Output from previous day: 06/01 0701 - 06/02 0700 In: 1003 [P.O.:1000; I.V.:3] Out: 1150 [Urine:1150] Intake/Output this shift: No intake/output data recorded.  Physical Exam: Patient is awake,alert, and O X self only. He stated that it was February and that he was in [place]. PERRL, EOMI.Speechis aphasic. MAEWstrength 5/5 BUE and BLE. Reports normal sensation on exam.Dressingis CDI. Incision is well approximated with no drainage, erythema, or edema.  Lab Results: Recent Labs    05/28/21 0227 05/30/21 0406  WBC 10.3 10.1  HGB 10.1* 10.8*  HCT 31.7* 32.7*  PLT 327 382   BMET Recent Labs    05/28/21 0227 05/30/21 0406  NA 131* 131*  K 4.4 4.2  CL 94* 93*  CO2 29 27  GLUCOSE 123* 104*  BUN 11 20  CREATININE 0.70 0.50*  CALCIUM 8.9 9.1    Studies/Results: No results found.  Assessment/Plan: 55 y.o. man w/ metastatic disease who is post op day #5 s/p thoracolumbar decompression and fusion. Aphasia persists. 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 CTH revealed 4 large lesions. Neuro examination continues to be stable. Palliative care on board. Awaiting pathology results to help dictate treatment options. Overall prognosis is poor given the extent of his intracranial disease. Continue working on pain control, mobility and ambulating patient. Continue Keppra 500 mg BID. Continue Decadron 4 mg Q6H  LOS: 6 days     Marvis Moeller, DNP, NP-C 05/30/2021, 8:18 AM  Continue supportive care.  Awaiting Pathology.

## 2021-05-30 NOTE — Plan of Care (Signed)
  Problem: Pain Managment: Goal: General experience of comfort will improve Outcome: Progressing   Problem: Safety: Goal: Ability to remain free from injury will improve Outcome: Progressing   Problem: Skin Integrity: Goal: Risk for impaired skin integrity will decrease Outcome: Progressing   Problem: Safety: Goal: Non-violent Restraint(s) Outcome: Progressing

## 2021-05-30 NOTE — Progress Notes (Addendum)
PROGRESS NOTE    Dylan Shaw  PYK:[id number] DOB: 06/05/1966 DOA: 05/24/2021 PCP: Patient, No Pcp Per (Inactive)   Brief Narrative: Dylan Shaw is a 55 y.o. male with a history of smoking presented secondary to 50 pound weight loss, fatigue and low back pain. He was found to have evidence of a lung and spine mass concerning for metastatic cancer. He was started on decadron. During hospitalization he developed urinary and bowel incontinence leading to neurosurgical management and surgical decompression of the spine.  Medical/radiation oncology have seen with plans to treat.  Surgical pathology pending.   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 Pulmonology initially consulted for pulmonary biopsy, I communicated with Dr. Chase Caller, PCCM on 6/1 who advised that they will be involved for EBUS if biopsy of brain/spine is nondiagnostic. Neurosurgery consulted for spine mass. Patient underwent partial vertebrectomy and laminectomy with resection of mass on 5/28 by neurosurgery with specimen obtained for biopsy. MRI confirmed metastatic brain lesions with associated edema and 11 mm rightward midline shift anteriorly. PT/OT recommending SNF -Medical oncology recommendations: Plan for chemotherapy -Radiation oncology recommendations: Decadron.  Radiation oncology input appreciated who plan on simulation in the next day or so for whole brain radiation. -Neurosurgery recommendations: continue Decadron and Keppra; recommending hospice as likely disposition -Palliative care consult from 5/31 appreciated: DNR/DNI established, MOST form completed, patient would be interested in cancer  treatment understanding that this will be palliative rather than curative. -I called the pathology lab on 6/2, report not yet ready, left my number for lab to call me back with report when ready.  Aphasia Focal seizure Code stroke called on 5/29 with negative CT and MRI for stroke. EEG ordered and without evidence of seizure activity. 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 Started on [place] and other seizure restrictions to be advised when able.  Acute encephalopathy Likely multifactorial but most importantly related to brain mets with mass-effect, steroid use and seizures with postictal phase.  Currently on bilateral wrist restraints and Posey belt.  Delirium precautions.  Minimize opioid or sedative use.  Severe malnutrition Dietitian consulted. Underweight.  As evidenced by bitemporal wasting and diffuse skeletal mass wasting, BMI of 15.92 and an albumin of 2.7.  Tobacco abuse Concern for possible COPD. Counseled.  Underweight Body mass index is 15.92 kg/m.   Elevated blood pressures As needed oral hydralazine.  Anemia of chronic disease Stable.  Follow CBC.   DVT prophylaxis: SCDs, Heparin subq Code Status:   Code Status: DNR Family Communication: I discussed in detail with patient's brother via phone on 6/2, updated care and answered all questions. Disposition Plan: Discharge to SNF in several days.  May require transfer to [facility] prior to discharge for radiation oncology management   Consultants:   Medical oncology  Radiation oncology  Pulmonology  Neurosurgery  Neurology  Procedures:  1. PLACEMENT OF PERCUTANEOUS PEDICLE SCREWS T 10 - L 2 AND RESECTION OF THORACIC SPINE MASS with partial vertebrectomy and laminectomy with resection of epidural metastasis and decompression of thoracic spinal cord (05/25/2021)  Antimicrobials:  None    Subjective: Alert and oriented only to self.  Really unable to provide any  history.  Seen sitting up and has completed eating almost all of his breakfast by himself.  As per nursing, intermittent issues with confusion, pulls out condom catheter, IV lines etc.  Objective: Vitals:   05/29/21 2313 05/30/21 0317 05/30/21 0748 05/30/21 1125  BP: 125/83 (!) 158/85 115/82 120/71  Pulse: 82 88 (!) 108 (!) 111  Resp:   16 16  Temp: 97.9 F (36.6 C) 97.7 F (36.5 C) 97.8 F (36.6 C) 98 F (36.7 C)  TempSrc:      SpO2: 99% 95% 97% 100%  Weight:      Height:        Intake/Output Summary (Last 24 hours) at 05/30/2021 1512 Last data filed at 05/30/2021 1300 Gross per 24 hour  Intake 803 ml  Output 1600 ml  Net -797 ml   Filed Weights   05/24/21 2208 05/25/21 0601 05/25/21 1737  Weight: 46.1 kg 46.1 kg 46.1 kg    Examination:  General exam: Middle-age male, looks older than stated age, moderately built, frail, chronically ill looking and cachectic with bitemporal and diffuse muscle wasting.  Sitting up comfortably in bed feeding himself with his right hand and has almost finished eating all of his breakfast. Respiratory system: Clear to auscultation.  No increased work of breathing. Cardiovascular system: S1 and S2 heard, RRR.  No JVD, murmurs or pedal edema.  Not on telemetry. Gastrointestinal system: Abdomen is nondistended, soft and nontender. No organomegaly or masses felt. Normal bowel sounds heard. Central nervous system: Alert and oriented to self only.  Follows occasional simple instructions.  Suspect both receptive and expressive aphasia. Musculoskeletal: No edema. No calf tenderness.  Bilateral wrist soft restraints and Posey belt. Skin: No cyanosis. No rashes. Thoracic incision honeycomb dressing with some old blood saturation-as examined by prior MD, did not examine today. Psychiatry: Judgement and insight impaired. Memory impaired.   Data Reviewed: I have personally reviewed following labs and imaging studies  CBC Lab Results  Component Value Date    WBC 10.1 05/30/2021   RBC 3.86 (L) 05/30/2021   HGB 10.8 (L) 05/30/2021   HCT 32.7 (L) 05/30/2021   MCV 84.7 05/30/2021   MCH 28.0 05/30/2021   PLT 382 05/30/2021   MCHC 33.0 05/30/2021   RDW 13.4 05/30/2021   LYMPHSABS 1.3 05/24/2021   MONOABS 0.7 05/24/2021   EOSABS 0.0 05/24/2021   BASOSABS 0.0 67/89/3810     Last metabolic panel Lab Results  Component Value Date   NA 131 (L) 05/30/2021   K 4.2 05/30/2021   CL 93 (L) 05/30/2021   CO2 27 05/30/2021   BUN 20 05/30/2021   CREATININE 0.50 (L) 05/30/2021   GLUCOSE 104 (H) 05/30/2021   GFRNONAA >60 05/30/2021   CALCIUM 9.1 05/30/2021   PROT 7.7 05/24/2021   ALBUMIN 2.7 (L) 05/24/2021   BILITOT 0.9 05/24/2021   ALKPHOS 92 05/24/2021   AST 15 05/24/2021   ALT 11 05/24/2021   ANIONGAP 11 05/30/2021    CBG (last 3)  No results for input(s): GLUCAP in the last 72 hours.   GFR: Estimated Creatinine Clearance: 69.6 mL/min (A) (by C-G formula based on SCr of 0.5 mg/dL (L)).  Coagulation Profile: No results for input(s): INR, PROTIME in the last 168 hours.  Recent Results (from the past 240 hour(s))  Resp Panel by RT-PCR (Flu A&B, Covid) Nasopharyngeal Swab     Status: None   Collection Time: 05/24/21 11:51 AM   Specimen: Nasopharyngeal Swab; Nasopharyngeal(NP) swabs in vial transport medium  Result Value  Ref Range Status   SARS Coronavirus 2 by RT PCR NEGATIVE NEGATIVE Final    Comment: (NOTE) SARS-CoV-2 target nucleic acids are NOT DETECTED.  The SARS-CoV-2 RNA is generally detectable in upper respiratory specimens during the acute phase of infection. The lowest concentration of SARS-CoV-2 viral copies this assay can detect is 138 copies/mL. A negative result does not preclude SARS-Cov-2 infection and should not be used as the sole basis for treatment or other patient management decisions. A negative result may occur with  improper specimen collection/handling, submission of specimen other than nasopharyngeal  swab, presence of viral mutation(s) within the areas targeted by this assay, and inadequate number of viral copies(<138 copies/mL). A negative result must be combined with clinical observations, patient history, and epidemiological information. The expected result is Negative.  Fact Sheet for Patients:  EntrepreneurPulse.com.au  Fact Sheet for Healthcare Providers:  IncredibleEmployment.be  This test is no t yet approved or cleared by the [place] FDA and  has been authorized for detection and/or diagnosis of SARS-CoV-2 by FDA under an Emergency Use Authorization (EUA). This EUA will remain  in effect (meaning this test can be used) for the duration of the COVID-19 declaration under Section 564(b)(1) of the Act, 21 U.S.C.section 360bbb-3(b)(1), unless the authorization is terminated  or revoked sooner.       Influenza A by PCR NEGATIVE NEGATIVE Final   Influenza B by PCR NEGATIVE NEGATIVE Final    Comment: (NOTE) The Xpert Xpress SARS-CoV-2/FLU/RSV plus assay is intended as an aid in the diagnosis of influenza from Nasopharyngeal swab specimens and should not be used as a sole basis for treatment. Nasal washings and aspirates are unacceptable for Xpert Xpress SARS-CoV-2/FLU/RSV testing.  Fact Sheet for Patients: EntrepreneurPulse.com.au  Fact Sheet for Healthcare Providers: IncredibleEmployment.be  This test is not yet approved or cleared by the [place] FDA and has been authorized for detection and/or diagnosis of SARS-CoV-2 by FDA under an Emergency Use Authorization (EUA). This EUA will remain in effect (meaning this test can be used) for the duration of the COVID-19 declaration under Section 564(b)(1) of the Act, 21 U.S.C. section 360bbb-3(b)(1), unless the authorization is terminated or revoked.  Performed at [facility], [address] [place], [place] [address]   Surgical pcr screen      Status: None   Collection Time: 05/25/21  6:02 PM   Specimen: Nasal Mucosa; Nasal Swab  Result Value Ref Range Status   MRSA, PCR NEGATIVE NEGATIVE Final   Staphylococcus aureus NEGATIVE NEGATIVE Final    Comment: (NOTE) The Xpert SA Assay (FDA approved for NASAL specimens in patients 5 years of age and older), is one component of a comprehensive surveillance program. It is not intended to diagnose infection nor to guide or monitor treatment. Performed at [facility], [place] [address], [place], [place] [address]         Radiology Studies: No results found.      Scheduled Meds: . (feeding supplement) PROSource Plus  30 mL Oral BID BM  . dexamethasone (DECADRON) injection  4 mg Intravenous Q6H   Or  . dexamethasone  4 mg Oral Q6H  . docusate sodium  100 mg Oral BID  . feeding supplement  237 mL Oral TID BM  . heparin  5,000 Units Subcutaneous Q8H  . levETIRAcetam  500 mg Oral BID  . pantoprazole  40 mg Oral QHS  . sodium chloride flush  3 mL Intravenous Q12H   Continuous Infusions: . sodium chloride    .  methocarbamol (ROBAXIN) IV       LOS: 6 days    Vernell Leep, MD, [place], [facility]. [facility]  To contact the attending provider between 7A-7P or the covering provider during after hours 7P-7A, please log into the web site [email or web address] and access using universal [place] password for that web site. If you do not have the password, please call the hospital operator.

## 2021-05-30 NOTE — Progress Notes (Signed)
Physical Therapy Treatment Patient Details Name: Dylan Shaw MRN: [id number] DOB: February 26, 1966 Today's Date: 05/30/2021    History of Present Illness 55 y.o. man w/ new metastatic disease s/p thoracolumbar decompression and fusion. Now w/ expressive aphasia 5/29 , self-limited, likely focal seizure. PMH met CA with primary site Lungs, smoker, 50 pound weight loss    PT Comments    The pt was agreeable to session with focus on progression of OOB mobility and dual-task challenge (mobility and cognitive tasks). The pt was able to complete all simple, direct commands this session, but lacks the attention and memory to carry out multi-step tasks or to complete way-finding in the hallway. The pt demos improved stability with use of RW, but was able to tolerate short bouts of walking with minA without AD. The pt moves impulsively quickly without use of AD, needs cues for safety and directions in addition to physical assist to steady with mobility. Will continue to benefit from skilled PT to challenge safety awareness and dynamic stability and will need SNF level rehab when ready for d/c.     Follow Up Recommendations  SNF     Equipment Recommendations  Rolling walker with 5" wheels;3in1 (PT)    Recommendations for Other Services       Precautions / Restrictions Precautions Precautions: Back Precaution Booklet Issued: Yes (comment) Precaution Comments: handout in room, posey belt, wrist restraints Restrictions Weight Bearing Restrictions: No    Mobility  Bed Mobility Overal bed mobility: Needs Assistance Bed Mobility: Sit to Sidelying Rolling: Supervision Sidelying to sit: Supervision       General bed mobility comments: sequential cues    Transfers Overall transfer level: Needs assistance Equipment used: None;1 person hand held assist;Rolling walker (2 wheeled) Transfers: Sit to/from Stand Sit to Stand: Min guard         General transfer comment: Min guard to rise to stand  from edge of bed  Ambulation/Gait Ambulation/Gait assistance: Min assist Gait Distance (Feet): 200 Feet (+15 ft in room) Assistive device: Rolling walker (2 wheeled);None Gait Pattern/deviations: Step-through pattern;Scissoring;Drifts right/left Gait velocity: decreased Gait velocity interpretation: 1.31 - 2.62 ft/sec, indicative of limited community ambulator General Gait Details: minG with RW but moments of minA due to instability and LOB. Pt needing increasd cues for posture and directioning. pt unable to way-find back to his room, decreased safety awarness noted multiple times with mobility      Modified Rankin (Stroke Patients Only) Modified Rankin (Stroke Patients Only) Pre-Morbid Rankin Score: No symptoms Modified Rankin: Moderately severe disability     Balance Overall balance assessment: Needs assistance Sitting-balance support: Feet supported Sitting balance-Leahy Scale: Fair     Standing balance support: During functional activity;Single extremity supported Standing balance-Leahy Scale: Poor Standing balance comment: requires assist for dynamic balance tasks                            Cognition Arousal/Alertness: Awake/alert Behavior During Therapy: WFL for tasks assessed/performed Overall Cognitive Status: Impaired/Different from baseline Area of Impairment: Orientation;Memory;Safety/judgement;Awareness                 Orientation Level: Disoriented to;Time;Situation;Place   Memory: Decreased short-term memory;Decreased recall of precautions   Safety/Judgement: Decreased awareness of deficits Awareness: Intellectual   General Comments: PT able to say "yes" and "no" fairly consistently, but needed increased cues or time to complete movements. pt stating "alright" multiple times in response to commands, but demos no initiation of movement. The  pt was able to follow simple cues for turning or exercises, but was unable to sequence tasks without  repeated cues, and was unable to remember multi-step cueswith activity.      Exercises      General Comments General comments (skin integrity, edema, etc.): VSS on RA. dressing slightly rolled up, RN alerted      Pertinent Vitals/Pain Pain Assessment: Faces Faces Pain Scale: Hurts little more Pain Location: back Pain Descriptors / Indicators: Operative site guarding;Grimacing Pain Intervention(s): Limited activity within patient's tolerance;Monitored during session;Repositioned           PT Goals (current goals can now be found in the care plan section) Acute Rehab PT Goals Patient Stated Goal: to return home PT Goal Formulation: With patient Time For Goal Achievement: 06/10/21 Potential to Achieve Goals: Good Progress towards PT goals: Progressing toward goals    Frequency    Min 5X/week      PT Plan Current plan remains appropriate       AM-PAC PT "6 Clicks" Mobility   Outcome Measure  Help needed turning from your back to your side while in a flat bed without using bedrails?: A Little Help needed moving from lying on your back to sitting on the side of a flat bed without using bedrails?: A Little Help needed moving to and from a bed to a chair (including a wheelchair)?: A Little Help needed standing up from a chair using your arms (e.g., wheelchair or bedside chair)?: A Little Help needed to walk in hospital room?: A Little Help needed climbing 3-5 steps with a railing? : A Lot 6 Click Score: 17    End of Session Equipment Utilized During Treatment: Gait belt Activity Tolerance: Patient tolerated treatment well Patient left: in chair;with call bell/phone within reach;with chair alarm set;with restraints reapplied Nurse Communication: Mobility status PT Visit Diagnosis: Unsteadiness on feet (R26.81)     Time: 9800-1239 PT Time Calculation (min) (ACUTE ONLY): 23 min  Charges:  $Gait Training: 23-37 mins                     Karma Ganja, PT, DPT   Acute  Rehabilitation Department Pager #: [phone]   Otho Bellows 05/30/2021, 4:19 PM

## 2021-05-30 NOTE — Plan of Care (Signed)
  Problem: Education: Goal: Knowledge of General Education information will improve Description: Including pain rating scale, medication(s)/side effects and non-pharmacologic comfort measures Outcome: Progressing   Problem: Clinical Measurements: Goal: Ability to maintain clinical measurements within normal limits will improve Outcome: Progressing   Problem: Activity: Goal: Risk for activity intolerance will decrease Outcome: Progressing   Problem: Nutrition: Goal: Adequate nutrition will be maintained Outcome: Progressing   Problem: Coping: Goal: Level of anxiety will decrease Outcome: Progressing   Problem: Pain Managment: Goal: General experience of comfort will improve Outcome: Progressing   Problem: Safety: Goal: Ability to remain free from injury will improve Outcome: Progressing   Problem: Skin Integrity: Goal: Risk for impaired skin integrity will decrease Outcome: Progressing   Problem: Safety: Goal: Non-violent Restraint(s) Outcome: Progressing

## 2021-05-31 LAB — SURGICAL PATHOLOGY

## 2021-05-31 MED ORDER — LORAZEPAM 2 MG/ML IJ SOLN
1.0000 mg | Freq: Once | INTRAMUSCULAR | Status: AC
  Administered 2021-05-31: 1 mg via INTRAVENOUS
  Filled 2021-05-31: qty 1

## 2021-05-31 NOTE — Progress Notes (Signed)
PROGRESS NOTE    Dylan Shaw  EXB:[id number] DOB: 03-20-1966 DOA: 05/24/2021 PCP: Patient, No Pcp Per (Inactive)   Brief Narrative: Dylan Shaw is a 55 y.o. male with a history of smoking presented secondary to 50 pound weight loss, fatigue and low back pain. He was found to have evidence of a lung and spine mass concerning for metastatic cancer. He was started on decadron. During hospitalization he developed urinary and bowel incontinence leading to neurosurgical management and surgical decompression of the spine.  Medical/radiation oncology have seen with plans to treat.  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   Assessment & Plan:   Active Problems:   Thoracic spine tumor   Lung cancer ([place])   Protein calorie malnutrition ([place])   Metastatic cancer to spine ([place])   Brain metastasis ([place])   Protein-calorie malnutrition, severe   Lung mass, T12 soft tissue mass, Concern for pulmonary malignancy with metastasis to spine and brain: Radiation/medical oncology consulted. Pulmonology initially consulted for pulmonary biopsy, I communicated with Dr. Chase Caller, PCCM on 6/1 who advised that they will be involved for EBUS if biopsy of brain/spine is nondiagnostic. Neurosurgery consulted for spine mass. Patient underwent partial vertebrectomy and laminectomy with resection of mass on 5/28 by neurosurgery with specimen obtained for biopsy. MRI confirmed metastatic brain lesions with associated edema and 11 mm rightward midline shift anteriorly. PT/OT recommending SNF -Medical oncology recommendations: Plan for chemotherapy -Radiation oncology recommendations: Decadron.  Radiation oncology input appreciated who plan on simulation in the next day or so for whole brain radiation. -Neurosurgery recommendations: continue Decadron and Keppra; recommending hospice as likely disposition -Palliative care consult from 5/31  appreciated: DNR/DNI established, MOST form completed, patient would be interested in cancer treatment understanding that this will be palliative rather than curative. 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  I have alerted medical oncology regarding this.  Aphasia Focal seizure Code stroke called on 5/29 with negative CT and MRI for stroke. EEG ordered and without evidence of seizure activity. 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 Started on [place] and other seizure restrictions to be advised when able.  Acute encephalopathy Likely multifactorial but most importantly related to brain mets with mass-effect, steroid use and seizures with postictal phase.  Currently on bilateral wrist restraints and Posey belt.  Delirium precautions.  Minimize opioid or sedative use.  Mental status seems somewhat better today.  Severe malnutrition Dietitian consulted. Underweight.  As evidenced by bitemporal wasting and diffuse skeletal mass wasting, BMI of 15.92 and an albumin of 2.7.  Tobacco abuse Concern for possible COPD. Counseled.  Underweight Body mass index is 15.92 kg/m.   Elevated blood pressures As needed oral hydralazine.  Anemia of chronic disease Stable.  Follow CBC.   DVT prophylaxis: SCDs, Heparin subq Code Status:   Code Status: DNR Family Communication: I discussed in detail with patient's brother via phone on 6/2, updated care and answered all questions. Disposition Plan: Discharge to SNF in several days.  May require transfer to [facility] prior to discharge for radiation oncology management   Consultants:   Medical oncology  Radiation oncology  Pulmonology  Neurosurgery  Neurology  Procedures:  1. PLACEMENT OF PERCUTANEOUS PEDICLE SCREWS T 10 - L 2 AND RESECTION OF THORACIC SPINE MASS with partial vertebrectomy and laminectomy with resection of  epidural metastasis and decompression of thoracic spinal cord (05/25/2021)  Pathology:  FINAL MICROSCOPIC DIAGNOSIS:   A.  THORACIC 12 TUMOR, RESECTION:  - Poorly differentiated carcinoma.   Antimicrobials:  None    Subjective: Per night RN, patient was mostly calm and cooperative through the course of the night until 6 AM when he pulled out his condom catheter, urinated in bed and floor.  Seen sitting up comfortably in reclining chair.  Oriented only to self, states that he is in [facility].  Does recognize his brother when I started to speak about him and that I had called him yesterday.  Follows simple instructions i.e. turned off the TV when requested.  Interestingly he has not complained of pain during my visits.  Objective: Vitals:   05/30/21 2302 05/31/21 0322 05/31/21 0739 05/31/21 1136  BP: 117/85 140/90 (!) 145/92 (!) 127/92  Pulse: 80 83 95 89  Resp: 19 20 16 14   Temp: 98.2 F (36.8 C) (!) 97.5 F (36.4 C) 98.2 F (36.8 C) 97.6 F (36.4 C)  TempSrc: Oral Oral    SpO2:  100% 100% 100%  Weight:      Height:        Intake/Output Summary (Last 24 hours) at 05/31/2021 1328 Last data filed at 05/31/2021 0600 Gross per 24 hour  Intake 600 ml  Output 1150 ml  Net -550 ml   Filed Weights   05/24/21 2208 05/25/21 0601 05/25/21 1737  Weight: 46.1 kg 46.1 kg 46.1 kg    Examination:  General exam: Middle-age male, looks older than stated age, moderately built, frail, chronically ill looking and cachectic with bitemporal and diffuse muscle wasting.  Sitting up comfortably in reclining chair. Respiratory system: Clear to auscultation.  No increased work of breathing. Cardiovascular system: S1 and S2 heard, RRR.  No JVD, murmurs or pedal edema. Gastrointestinal system: Abdomen is nondistended, soft and nontender. No organomegaly or masses felt. Normal bowel sounds heard. Central nervous system: Alert and oriented to self only.  Follows simple instructions as noted above.   Suspect both receptive and expressive aphasia. Musculoskeletal: No edema. No calf tenderness.  Bilateral wrist soft restraints and Posey belt. Skin: No cyanosis. No rashes. Thoracic incision honeycomb dressing with some old blood saturation-as examined by prior MD, did not examine today. Psychiatry: Judgement and insight impaired. Memory impaired.   Data Reviewed: I have personally reviewed following labs and imaging studies  CBC Lab Results  Component Value Date   WBC 10.1 05/30/2021   RBC 3.86 (L) 05/30/2021   HGB 10.8 (L) 05/30/2021   HCT 32.7 (L) 05/30/2021   MCV 84.7 05/30/2021   MCH 28.0 05/30/2021   PLT 382 05/30/2021   MCHC 33.0 05/30/2021   RDW 13.4 05/30/2021   LYMPHSABS 1.3 05/24/2021   MONOABS 0.7 05/24/2021   EOSABS 0.0 05/24/2021   BASOSABS 0.0 27/06/8674     Last metabolic panel Lab Results  Component Value Date   NA 131 (L) 05/30/2021   K 4.2 05/30/2021   CL 93 (L) 05/30/2021   CO2 27 05/30/2021   BUN 20 05/30/2021   CREATININE 0.50 (L) 05/30/2021   GLUCOSE 104 (H) 05/30/2021   GFRNONAA >60 05/30/2021   CALCIUM 9.1 05/30/2021   PROT 7.7 05/24/2021   ALBUMIN 2.7 (L) 05/24/2021   BILITOT 0.9 05/24/2021   ALKPHOS 92 05/24/2021   AST 15 05/24/2021   ALT 11 05/24/2021   ANIONGAP 11 05/30/2021    CBG (last 3)  No results for input(s): GLUCAP in the last 72 hours.   GFR: Estimated Creatinine Clearance: 69.6 mL/min (A) (by C-G formula based on  SCr of 0.5 mg/dL (L)).  Coagulation Profile: No results for input(s): INR, PROTIME in the last 168 hours.  Recent Results (from the past 240 hour(s))  Resp Panel by RT-PCR (Flu A&B, Covid) Nasopharyngeal Swab     Status: None   Collection Time: 05/24/21 11:51 AM   Specimen: Nasopharyngeal Swab; Nasopharyngeal(NP) swabs in vial transport medium  Result Value Ref Range Status   SARS Coronavirus 2 by RT PCR NEGATIVE NEGATIVE Final    Comment: (NOTE) SARS-CoV-2 target nucleic acids are NOT DETECTED.  The  SARS-CoV-2 RNA is generally detectable in upper respiratory specimens during the acute phase of infection. The lowest concentration of SARS-CoV-2 viral copies this assay can detect is 138 copies/mL. A negative result does not preclude SARS-Cov-2 infection and should not be used as the sole basis for treatment or other patient management decisions. A negative result may occur with  improper specimen collection/handling, submission of specimen other than nasopharyngeal swab, presence of viral mutation(s) within the areas targeted by this assay, and inadequate number of viral copies(<138 copies/mL). A negative result must be combined with clinical observations, patient history, and epidemiological information. The expected result is Negative.  Fact Sheet for Patients:  EntrepreneurPulse.com.au  Fact Sheet for Healthcare Providers:  IncredibleEmployment.be  This test is no t yet approved or cleared by the [place] FDA and  has been authorized for detection and/or diagnosis of SARS-CoV-2 by FDA under an Emergency Use Authorization (EUA). This EUA will remain  in effect (meaning this test can be used) for the duration of the COVID-19 declaration under Section 564(b)(1) of the Act, 21 U.S.C.section 360bbb-3(b)(1), unless the authorization is terminated  or revoked sooner.       Influenza A by PCR NEGATIVE NEGATIVE Final   Influenza B by PCR NEGATIVE NEGATIVE Final    Comment: (NOTE) The Xpert Xpress SARS-CoV-2/FLU/RSV plus assay is intended as an aid in the diagnosis of influenza from Nasopharyngeal swab specimens and should not be used as a sole basis for treatment. Nasal washings and aspirates are unacceptable for Xpert Xpress SARS-CoV-2/FLU/RSV testing.  Fact Sheet for Patients: EntrepreneurPulse.com.au  Fact Sheet for Healthcare Providers: IncredibleEmployment.be  This test is not yet approved or  cleared by the [place] FDA and has been authorized for detection and/or diagnosis of SARS-CoV-2 by FDA under an Emergency Use Authorization (EUA). This EUA will remain in effect (meaning this test can be used) for the duration of the COVID-19 declaration under Section 564(b)(1) of the Act, 21 U.S.C. section 360bbb-3(b)(1), unless the authorization is terminated or revoked.  Performed at [facility], [address] [place], [place] [address]   Surgical pcr screen     Status: None   Collection Time: 05/25/21  6:02 PM   Specimen: Nasal Mucosa; Nasal Swab  Result Value Ref Range Status   MRSA, PCR NEGATIVE NEGATIVE Final   Staphylococcus aureus NEGATIVE NEGATIVE Final    Comment: (NOTE) The Xpert SA Assay (FDA approved for NASAL specimens in patients 96 years of age and older), is one component of a comprehensive surveillance program. It is not intended to diagnose infection nor to guide or monitor treatment. Performed at [facility], [place] [address], [place], [place] [address]         Radiology Studies: No results found.      Scheduled Meds: . (feeding supplement) PROSource Plus  30 mL Oral BID BM  . dexamethasone (DECADRON) injection  4 mg Intravenous Q6H   Or  . dexamethasone  4 mg Oral Q6H  . docusate sodium  100 mg Oral BID  . feeding supplement  237 mL Oral TID BM  . heparin  5,000 Units Subcutaneous Q8H  . levETIRAcetam  500 mg Oral BID  . pantoprazole  40 mg Oral QHS  . sodium chloride flush  3 mL Intravenous Q12H   Continuous Infusions: . methocarbamol (ROBAXIN) IV       LOS: 7 days    Vernell Leep, MD, [place], [facility]. [facility]  To contact the attending provider between 7A-7P or the covering provider during after hours 7P-7A, please log into the web site [email or web address] and access using universal [place] password for that web site. If you do not have the password, please call the hospital operator.

## 2021-05-31 NOTE — Progress Notes (Signed)
Physical Therapy Treatment Patient Details Name: Dylan Shaw MRN: [id number] DOB: 09-18-1966 Today's Date: 05/31/2021    History of Present Illness 55 y.o. man w/ new metastatic disease s/p thoracolumbar decompression and fusion. Now w/ expressive aphasia 5/29 , self-limited, likely focal seizure. PMH met CA with primary site Lungs, smoker, 50 pound weight loss    PT Comments    The pt was agreeable to session despite reports of increased back pain this morning. The pt was challenged by hallway ambulation without use of AD or UE support today. He demos increased lateral movement and increased drifting and instances of scissoring this morning requiring minA to modA at times to steady. The pt was able to maintain static stand without UE support with sit-stand multiple times through session, but remains impulsive with initial steps and with decreased safety awareness which keeps him at significantly increased risk of falls. The pt will continue to benefit from skilled PT to progress dynamic stability and activity tolerance, continue to recommend SNF for continued rehab following d/c.   Follow Up Recommendations  SNF     Equipment Recommendations  Rolling walker with 5" wheels;3in1 (PT)    Recommendations for Other Services       Precautions / Restrictions Precautions Precautions: Back Precaution Booklet Issued: Yes (comment) Precaution Comments: handout in room, posey belt, wrist restraints Restrictions Weight Bearing Restrictions: No    Mobility  Bed Mobility Overal bed mobility: Needs Assistance             General bed mobility comments: pt OOB with NT upon arrival    Transfers Overall transfer level: Needs assistance Equipment used: None;1 person hand held assist Transfers: Sit to/from Stand Sit to Stand: Min guard         General transfer comment: minG to power up, VC for hand placement/use of grab bars  Ambulation/Gait Ambulation/Gait assistance: Min  assist Gait Distance (Feet): 250 Feet Assistive device: None Gait Pattern/deviations: Step-through pattern;Scissoring;Drifts right/left Gait velocity: decreased Gait velocity interpretation: <1.31 ft/sec, indicative of household ambulator General Gait Details: minA with no UE support for hallway ambulation, pt with no ability to complete wya-finding or manage direction in hallway. needing direct cues for each step, but then able to follow. Pt unable to dual task (count while walking) with any accuracy, and increased balance issues with attempts    Modified Rankin (Stroke Patients Only) Modified Rankin (Stroke Patients Only) Pre-Morbid Rankin Score: No symptoms Modified Rankin: Moderately severe disability     Balance Overall balance assessment: Needs assistance Sitting-balance support: Feet supported Sitting balance-Leahy Scale: Fair     Standing balance support: During functional activity;Single extremity supported Standing balance-Leahy Scale: Poor Standing balance comment: requires assist for dynamic balance tasks                            Cognition Arousal/Alertness: Awake/alert Behavior During Therapy: WFL for tasks assessed/performed Overall Cognitive Status: Impaired/Different from baseline Area of Impairment: Orientation;Memory;Safety/judgement;Awareness                 Orientation Level: Disoriented to;Place;Situation   Memory: Decreased short-term memory;Decreased recall of precautions   Safety/Judgement: Decreased awareness of deficits Awareness: Intellectual   General Comments: pt able to follow simple commands and verbalizing agreement with plan, continues to have difficulty with multi-step commands due to decreased memory and attention.      Exercises      General Comments General comments (skin integrity, edema, etc.): VSS on RA  Pertinent Vitals/Pain Pain Assessment: Faces Faces Pain Scale: Hurts even more Pain Location:  [place] Pain Descriptors / Indicators: Operative site guarding;Grimacing Pain Intervention(s): Limited activity within patient's tolerance;Monitored during session;Repositioned           PT Goals (current goals can now be found in the care plan section) Acute Rehab PT Goals Patient Stated Goal: to return home PT Goal Formulation: With patient Time For Goal Achievement: 06/10/21 Potential to Achieve Goals: Good Progress towards PT goals: Progressing toward goals    Frequency    Min 5X/week      PT Plan Current plan remains appropriate        AM-PAC PT "6 Clicks" Mobility   Outcome Measure  Help needed turning from your back to your side while in a flat bed without using bedrails?: A Little Help needed moving from lying on your back to sitting on the side of a flat bed without using bedrails?: A Little Help needed moving to and from a bed to a chair (including a wheelchair)?: A Little Help needed standing up from a chair using your arms (e.g., wheelchair or bedside chair)?: A Little Help needed to walk in hospital room?: A Little Help needed climbing 3-5 steps with a railing? : A Lot 6 Click Score: 17    End of Session Equipment Utilized During Treatment: Gait belt Activity Tolerance: Patient tolerated treatment well Patient left: in chair;with call bell/phone within reach;with chair alarm set;with restraints reapplied Nurse Communication: Mobility status PT Visit Diagnosis: Unsteadiness on feet (R26.81)     Time: 1050-1108 PT Time Calculation (min) (ACUTE ONLY): 18 min  Charges:  $Gait Training: 8-22 mins                     Karma Ganja, PT, DPT   Acute Rehabilitation Department Pager #: [phone]   Otho Bellows 05/31/2021, 11:15 AM

## 2021-05-31 NOTE — Progress Notes (Signed)
Subjective: Patient reports "I'm doing alright." He continues to report moderate back and incisional pain that is well controlled on PO analgesics.   Objective: Vital signs in last 24 hours: Temp:  [97.5 F (36.4 C)-98.2 F (36.8 C)] 97.5 F (36.4 C) (06/03 0322) Pulse Rate:  [80-111] 83 (06/03 0322) Resp:  [16-20] 20 (06/03 0322) BP: (102-140)/(71-90) 140/90 (06/03 0322) SpO2:  [97 %-100 %] 100 % (06/03 0322)  Intake/Output from previous day: 06/02 0701 - 06/03 0700 In: 1100 [P.O.:1100] Out: 1950 [Urine:1950] Intake/Output this shift: No intake/output data recorded.  Physical Exam: Patient is awake,alert, and O X self only. He stated that it was February and that he was in [facility]. PERRL, EOMI.Speechis mildy aphasic but is improved from yesterday. MAEWstrength 5/5 BUE and BLE. Reports normal sensation on exam.Dressingis CDI. Incision is well approximated with no drainage, erythema, or edema.  Lab Results: Recent Labs    05/30/21 0406  WBC 10.1  HGB 10.8*  HCT 32.7*  PLT 382   BMET Recent Labs    05/30/21 0406  NA 131*  K 4.2  CL 93*  CO2 27  GLUCOSE 104*  BUN 20  CREATININE 0.50*  CALCIUM 9.1    Studies/Results: No results found.  Assessment/Plan: 55 y.o.man w/ metastatic diseasewho is post op day #6s/p thoracolumbar decompression and fusion.Aphasia persists but is improving. Neuro examination  is stable. Palliative care on board. Overall prognosis is poor given the extent of his intracranial disease. Continue working on pain control, mobility and ambulating patient.  -Continue Keppra500 mg BID -Continue Decadron4 mg Q6H -Awaiting pathology results    LOS: 7 days     Marvis Moeller, DNP, NP-C 05/31/2021, 7:31 AM

## 2021-05-31 NOTE — Progress Notes (Signed)
Palliative Medicine Inpatient Follow Up Note  Reason for consult:  Goals of Care "Goals of care. metastatic cancer, possible pulmonary source. Biopsy pending. Brain/spine mets. Seizures on AEDs. Severe malnutrition."  HPI:  Per intake H&P --> NelsonPruittis a55 y.o.male,medical history, but he has not followed with any physician in the past, with history of smoking 1 pack for last 35 years, he presents to ED with multiple complaints, including weight loss, generalized weakness, fatigue, shortness of breath and reported 50 pounds weight loss over last 6 weeks, as well complaining of back pain. He has been hospitalized for four days during which time he has been identified to metastatic lung cancer. He has also had concurrent aphasia in the setting of a focal seizure.   Palliative care was consulted to further address goals of care in the setting of metastatic disease.  Today's Discussion (05/31/2021):  *Please note that this is a verbal dictation therefore any spelling or grammatical errors are due to the "[employer] One" system interpretation.  Chart reviewed. Patients pathology results -->  poorly differentiated lung adenocarcinoma.   I went by Dylan Shaw's room this early evening. He appears quite disoriented and shares with me that he is "leaving" and is "sick of being here". He did not remember where he was or why he was in the hospital.   I spoke to patients RN, Yvone Neu who shares that Dylan Shaw has been waxing and waning with his mental state throughout the day.   I spoke to patients brother, Dylan Shaw over the phone. He confirms the plan for radiation. We reviewed the reality of the situation which is that Dylan Shaw now has cancer which has spread to multiple parts of his body and treatment from here will not be for cure but will aid in symptom relief. He reports that he know his brother "ain't gonna have much time." He still is interested in him getting treatment. I shared that I will  involved OP palliative support if and when Dylan Shaw is stable enough to discharge.   Questions and concerns addressed   Objective Assessment: Vital Signs Vitals:   05/31/21 1136 05/31/21 1543  BP: (!) 127/92 119/87  Pulse: 89 94  Resp: 14 16  Temp: 97.6 F (36.4 C) 98.2 F (36.8 C)  SpO2: 100% 98%    Intake/Output Summary (Last 24 hours) at 05/31/2021 1641 Last data filed at 05/31/2021 1503 Gross per 24 hour  Intake 1200 ml  Output 1600 ml  Net -400 ml   Last Weight  Most recent update: 05/25/2021  5:40 PM   Weight  46.1 kg (101 lb 10.1 oz)           Gen: Cachectic Caucasian male no acute distress HEENT: moist mucous membranes CV: Regular rate and rhythm PULM: clear to auscultation bilaterally ABD: soft/nontender EXT: (+) muscle wasting  Neuro: Disoriented  SUMMARY OF RECOMMENDATIONS DNAR/DNI  DNR/MOST completed  Appreciate Chaplain helping with advance directives (have completed and placed on front of chart to be notarized)  Oncology to weigh in on possible options from here   Per review of Radiation-Oncology notes plan for whole brain radiation  Strict delirium precuations  OP Palliative support will be requested when appropriate  Ongoing support in the oncoming days  Time Spent: 25 Greater than 50% of the time was spent in counseling and coordination of care ______________________________________________________________________________________ Long Team Team Cell Phone: [phone] Please utilize secure chat with additional questions, if there is no response within 30 minutes please  call the above phone number  Palliative Medicine Team providers are available by phone from 7am to 7pm daily and can be reached through the team cell phone.  Should this patient require assistance outside of these hours, please call the patient's attending physician.

## 2021-05-31 NOTE — Plan of Care (Signed)
  Problem: Clinical Measurements: Goal: Ability to maintain clinical measurements within normal limits will improve Outcome: Progressing   Problem: Activity: Goal: Risk for activity intolerance will decrease Outcome: Progressing   Problem: Nutrition: Goal: Adequate nutrition will be maintained Outcome: Progressing   Problem: Coping: Goal: Level of anxiety will decrease Outcome: Progressing   Problem: Safety: Goal: Non-violent Restraint(s) Outcome: Progressing

## 2021-06-01 MED ORDER — LORAZEPAM 0.5 MG PO TABS
0.5000 mg | ORAL_TABLET | Freq: Three times a day (TID) | ORAL | Status: DC | PRN
  Administered 2021-06-01: 0.5 mg via ORAL
  Filled 2021-06-01: qty 1

## 2021-06-01 NOTE — Progress Notes (Addendum)
PROGRESS NOTE    Dylan Shaw  ONG:[id number] DOB: Feb 10, 1966 DOA: 05/24/2021 PCP: Patient, No Pcp Per (Inactive)   Brief Narrative: Dylan Shaw is a 55 y.o. male with a history of smoking presented secondary to 50 pound weight loss, fatigue and low back pain. He was found to have evidence of a lung and spine mass concerning for metastatic cancer. He was started on decadron. During hospitalization he developed urinary and bowel incontinence leading to neurosurgical management and surgical decompression of the spine.  Medical/radiation oncology have seen with plans to treat.  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  Medical oncology will arrange outpatient follow-up, likely at the [facility] as communicated with Dr. Lorenso Courier on 6/3.  Course complicated by acute encephalopathy and intermittent agitation requiring restraints.   Assessment & Plan:   Active Problems:   Thoracic spine tumor   Lung cancer ([place])   Protein calorie malnutrition ([place])   Metastatic cancer to spine ([place])   Brain metastasis ([place])   Protein-calorie malnutrition, severe   Lung mass, T12 soft tissue mass, metastatic malignancy to spine and brain, suspecting lung primary: Radiation/medical oncology consulted. Pulmonology initially consulted for pulmonary biopsy, I communicated with Dr. Chase Caller, PCCM on 6/1 who advised that they will be involved for EBUS if biopsy of brain/spine is nondiagnostic. Neurosurgery consulted for spine mass. Patient underwent partial vertebrectomy and laminectomy with resection of mass on 5/28 by neurosurgery with specimen obtained for biopsy. MRI confirmed metastatic brain lesions with associated edema and 11 mm rightward midline shift anteriorly. PT/OT recommending SNF -Medical oncology recommendations: Plan for chemotherapy -Radiation oncology recommendations: Decadron.  Radiation oncology input appreciated  who plan on whole brain radiation.  Unclear when this is supposed to start.  He may need to be transferred to [facility] for same.  Unfortunately with his altered mental status, requiring restraints, has not really improved over the last 3 to 4 days, will not be a candidate for DC to SNF soon. -Neurosurgery recommendations: continue Decadron and Keppra; recommending hospice as likely disposition -Palliative care followed up: DNR/DNI established, MOST form completed, patient would be interested in cancer treatment understanding that this will be palliative rather than curative.  Patient's brother also understands this. 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  I communicated with Dr. Lorenso Courier on 6/3 who indicated that they will arrange outpatient follow-up, likely at the [place] Focal seizure Code stroke called on 5/29 with negative CT and MRI for stroke. EEG ordered and without evidence of seizure activity. 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 Started on [place] and other seizure restrictions to be advised when able.  Acute encephalopathy Likely multifactorial but most importantly related to brain mets with mass-effect, steroid use and seizures with postictal phase.  Currently on bilateral wrist restraints and Posey belt.  Delirium precautions.  Minimize opioid or sedative use.  Since 6/3 evening, intermittent agitation, threatening to leave.  Patient does not have the capacity to make medical decisions or leave AMA- informed RN of same.  Initiated as needed Ativan for agitation which helped yesterday.  Avoiding Haldol given concern for seizures and reducing seizure threshold.  Severe malnutrition Dietitian consulted. Underweight.  As evidenced by bitemporal wasting and diffuse skeletal mass wasting, BMI of 15.92 and an albumin of 2.7.  Tobacco abuse Concern  for possible COPD. Counseled.  Underweight Body mass index is 15.92 kg/m.  Elevated blood pressures As needed oral hydralazine.  Anemia of chronic disease Stable.  Follow CBC.   DVT prophylaxis: SCDs, Heparin subq Code Status:   Code Status: DNR Family Communication: I discussed in detail with patient's brother via phone on 6/2, updated care and answered all questions. Disposition Plan: Discharge to SNF in several days.  May require transfer to [facility] prior to discharge for radiation oncology management- will discuss with radiation oncology on Monday.   Consultants:   Medical oncology  Radiation oncology  Pulmonology  Neurosurgery  Neurology  Procedures:  1. PLACEMENT OF PERCUTANEOUS PEDICLE SCREWS T 10 - L 2 AND RESECTION OF THORACIC SPINE MASS with partial vertebrectomy and laminectomy with resection of epidural metastasis and decompression of thoracic spinal cord (05/25/2021)  Pathology:  FINAL MICROSCOPIC DIAGNOSIS:   A. THORACIC 12 TUMOR, RESECTION:  - Poorly differentiated carcinoma.   Antimicrobials:  None    Subjective: Alert and oriented only to self.  As per RN, settled last evening after dose of Ativan.  Again sometime this morning, pulled out condom catheter, urinated in bed, pulled out one of his 2 wrist restraints.  Objective: Vitals:   05/31/21 2328 06/01/21 0403 06/01/21 0820 06/01/21 1054  BP: 122/86 136/84 126/90   Pulse:  (!) 101 91 100  Resp:  18 20 16   Temp:  97.6 F (36.4 C) 97.6 F (36.4 C) (!) 97.4 F (36.3 C)  TempSrc:  Oral Oral Oral  SpO2:  100% 99% 98%  Weight:      Height:        Intake/Output Summary (Last 24 hours) at 06/01/2021 1512 Last data filed at 06/01/2021 1601 Gross per 24 hour  Intake 253 ml  Output 800 ml  Net -547 ml   Filed Weights   05/24/21 2208 05/25/21 0601 05/25/21 1737  Weight: 46.1 kg 46.1 kg 46.1 kg    Examination:  General exam: Middle-age male, looks older than stated age,  moderately built, frail, chronically ill looking and cachectic with bitemporal and diffuse muscle wasting.  Lying comfortably in bed.  Half naked below waist. Respiratory system: Clear to auscultation.  No increased work of breathing. Cardiovascular system: S1 and S2 heard, RRR.  No JVD, murmurs or pedal edema. Gastrointestinal system: Abdomen is nondistended, soft and nontender. No organomegaly or masses felt. Normal bowel sounds heard. Central nervous system: Alert and oriented to self only and indicates that he is in [facility]  Suspect both receptive and expressive aphasia. Musculoskeletal: No edema. No calf tenderness.  Bilateral wrist soft restraints and Posey belt. Skin: No cyanosis. No rashes. Thoracic incision honeycomb dressing with some old blood saturation-as examined by prior MD, did not examine today. Psychiatry: Judgement and insight impaired. Memory impaired.   Data Reviewed: I have personally reviewed following labs and imaging studies  CBC Lab Results  Component Value Date   WBC 10.1 05/30/2021   RBC 3.86 (L) 05/30/2021   HGB 10.8 (L) 05/30/2021   HCT 32.7 (L) 05/30/2021   MCV 84.7 05/30/2021   MCH 28.0 05/30/2021   PLT 382 05/30/2021   MCHC 33.0 05/30/2021   RDW 13.4 05/30/2021   LYMPHSABS 1.3 05/24/2021   MONOABS 0.7 05/24/2021   EOSABS 0.0 05/24/2021   BASOSABS 0.0 09/32/3557     Last metabolic panel Lab Results  Component Value Date   NA 131 (L) 05/30/2021   K 4.2 05/30/2021   CL 93 (L) 05/30/2021   CO2 27 05/30/2021   BUN 20 05/30/2021  CREATININE 0.50 (L) 05/30/2021   GLUCOSE 104 (H) 05/30/2021   GFRNONAA >60 05/30/2021   CALCIUM 9.1 05/30/2021   PROT 7.7 05/24/2021   ALBUMIN 2.7 (L) 05/24/2021   BILITOT 0.9 05/24/2021   ALKPHOS 92 05/24/2021   AST 15 05/24/2021   ALT 11 05/24/2021   ANIONGAP 11 05/30/2021    CBG (last 3)  No results for input(s): GLUCAP in the last 72 hours.   GFR: Estimated Creatinine Clearance: 69.6 mL/min (A)  (by C-G formula based on SCr of 0.5 mg/dL (L)).  Coagulation Profile: No results for input(s): INR, PROTIME in the last 168 hours.  Recent Results (from the past 240 hour(s))  Resp Panel by RT-PCR (Flu A&B, Covid) Nasopharyngeal Swab     Status: None   Collection Time: 05/24/21 11:51 AM   Specimen: Nasopharyngeal Swab; Nasopharyngeal(NP) swabs in vial transport medium  Result Value Ref Range Status   SARS Coronavirus 2 by RT PCR NEGATIVE NEGATIVE Final    Comment: (NOTE) SARS-CoV-2 target nucleic acids are NOT DETECTED.  The SARS-CoV-2 RNA is generally detectable in upper respiratory specimens during the acute phase of infection. The lowest concentration of SARS-CoV-2 viral copies this assay can detect is 138 copies/mL. A negative result does not preclude SARS-Cov-2 infection and should not be used as the sole basis for treatment or other patient management decisions. A negative result may occur with  improper specimen collection/handling, submission of specimen other than nasopharyngeal swab, presence of viral mutation(s) within the areas targeted by this assay, and inadequate number of viral copies(<138 copies/mL). A negative result must be combined with clinical observations, patient history, and epidemiological information. The expected result is Negative.  Fact Sheet for Patients:  EntrepreneurPulse.com.au  Fact Sheet for Healthcare Providers:  IncredibleEmployment.be  This test is no t yet approved or cleared by the [place] FDA and  has been authorized for detection and/or diagnosis of SARS-CoV-2 by FDA under an Emergency Use Authorization (EUA). This EUA will remain  in effect (meaning this test can be used) for the duration of the COVID-19 declaration under Section 564(b)(1) of the Act, 21 U.S.C.section 360bbb-3(b)(1), unless the authorization is terminated  or revoked sooner.       Influenza A by PCR NEGATIVE NEGATIVE Final    Influenza B by PCR NEGATIVE NEGATIVE Final    Comment: (NOTE) The Xpert Xpress SARS-CoV-2/FLU/RSV plus assay is intended as an aid in the diagnosis of influenza from Nasopharyngeal swab specimens and should not be used as a sole basis for treatment. Nasal washings and aspirates are unacceptable for Xpert Xpress SARS-CoV-2/FLU/RSV testing.  Fact Sheet for Patients: EntrepreneurPulse.com.au  Fact Sheet for Healthcare Providers: IncredibleEmployment.be  This test is not yet approved or cleared by the [place] FDA and has been authorized for detection and/or diagnosis of SARS-CoV-2 by FDA under an Emergency Use Authorization (EUA). This EUA will remain in effect (meaning this test can be used) for the duration of the COVID-19 declaration under Section 564(b)(1) of the Act, 21 U.S.C. section 360bbb-3(b)(1), unless the authorization is terminated or revoked.  Performed at [facility], [address], [place], [place] [address]   Surgical pcr screen     Status: None   Collection Time: 05/25/21  6:02 PM   Specimen: Nasal Mucosa; Nasal Swab  Result Value Ref Range Status   MRSA, PCR NEGATIVE NEGATIVE Final   Staphylococcus aureus NEGATIVE NEGATIVE Final    Comment: (NOTE) The Xpert SA Assay (FDA approved for NASAL specimens in patients 22  years of age and older), is one component of a comprehensive surveillance program. It is not intended to diagnose infection nor to guide or monitor treatment. Performed at [facility], [place] [address] [place], [place] [address]         Radiology Studies: No results found.      Scheduled Meds: . (feeding supplement) PROSource Plus  30 mL Oral BID BM  . dexamethasone (DECADRON) injection  4 mg Intravenous Q6H   Or  . dexamethasone  4 mg Oral Q6H  . docusate sodium  100 mg Oral BID  . feeding supplement  237 mL Oral TID BM  . heparin  5,000 Units Subcutaneous Q8H  . levETIRAcetam  500 mg  Oral BID  . pantoprazole  40 mg Oral QHS  . sodium chloride flush  3 mL Intravenous Q12H   Continuous Infusions: . methocarbamol (ROBAXIN) IV       LOS: 8 days    Vernell Leep, MD, [place], [facility]. [facility]  To contact the attending provider between 7A-7P or the covering provider during after hours 7P-7A, please log into the web site [email or web address] and access using universal [place] password for that web site. If you do not have the password, please call the hospital operator.

## 2021-06-01 NOTE — Plan of Care (Signed)
  Problem: Safety: Goal: Non-violent Restraint(s) Outcome: Progressing   Pt remains in restraints. Skin under restraints assessed, no redness or breakdown noted. Circulation assessed and WNL ( Capillary refill <3 seconds).

## 2021-06-01 NOTE — Progress Notes (Signed)
NEUROSURGERY PROGRESS NOTE  S/p thoracolumbar fusion with brain mets. Palliative care involved.  Doing ok. Complains of appropriate back soreness Good strength and sensation. Continue therapies, no new nsgy recom.   Temp:  [97.6 F (36.4 C)-98.5 F (36.9 C)] 97.6 F (36.4 C) (06/04 0820) Pulse Rate:  [89-101] 91 (06/04 0820) Resp:  [14-20] 20 (06/04 0820) BP: (119-136)/(84-92) 126/90 (06/04 0820) SpO2:  [95 %-100 %] 99 % (06/04 0820)   Dylan Chiquito, NP 06/01/2021 8:36 AM

## 2021-06-02 MED ORDER — LORAZEPAM 2 MG/ML IJ SOLN
INTRAMUSCULAR | Status: AC
  Administered 2021-06-02: 1 mg via INTRAVENOUS
  Filled 2021-06-02: qty 1

## 2021-06-02 MED ORDER — LORAZEPAM 2 MG/ML IJ SOLN
1.0000 mg | Freq: Four times a day (QID) | INTRAMUSCULAR | Status: DC | PRN
  Administered 2021-06-04: 1 mg via INTRAVENOUS
  Filled 2021-06-02 (×3): qty 1

## 2021-06-02 NOTE — Plan of Care (Signed)
  Problem: Safety: Goal: Non-violent Restraint(s) 06/02/2021 1008 by Theotis Barrio, RN Outcome: Not Progressing 06/01/2021 2125 by Theotis Barrio, RN Outcome: Progressing    Pt remains in restraints; restraints assessed, no redness noted. Circulation good, <3 seconds

## 2021-06-02 NOTE — Progress Notes (Signed)
PROGRESS NOTE    Dylan Shaw  UVO:[id number] DOB: 02/19/1966 DOA: 05/24/2021 PCP: Patient, No Pcp Per (Inactive)   Brief Narrative: Dylan Shaw is a 55 y.o. male with a history of smoking presented secondary to 50 pound weight loss, fatigue and low back pain. He was found to have evidence of a lung and spine mass concerning for metastatic cancer. He was started on decadron. During hospitalization he developed urinary and bowel incontinence leading to neurosurgical management and surgical decompression of the spine.  Medical/radiation oncology have seen with plans to treat.  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  Medical oncology will arrange outpatient follow-up, likely at the [facility] as communicated with Dr. Lorenso Courier on 6/3.  Course complicated by acute encephalopathy and intermittent agitation requiring restraints.   Assessment & Plan:   Active Problems:   Thoracic spine tumor   Lung cancer ([place])   Protein calorie malnutrition ([place])   Metastatic cancer to spine ([place])   Brain metastasis ([place])   Protein-calorie malnutrition, severe   Lung mass, T12 soft tissue mass, metastatic malignancy to spine and brain, suspecting lung primary: Radiation/medical oncology consulted. Pulmonology initially consulted for pulmonary biopsy, I communicated with Dr. Chase Caller, PCCM on 6/1 who advised that they will be involved for EBUS if biopsy of brain/spine is nondiagnostic. Neurosurgery consulted for spine mass. Patient underwent partial vertebrectomy and laminectomy with resection of mass on 5/28 by neurosurgery with specimen obtained for biopsy. MRI confirmed metastatic brain lesions with associated edema and 11 mm rightward midline shift anteriorly. PT/OT recommending SNF -Medical oncology recommendations: Plan for chemotherapy -Radiation oncology recommendations: Decadron.  Radiation oncology input appreciated  who plan on whole brain radiation.  Unclear when this is supposed to start.  He may need to be transferred to [place] for same- plan to discuss with radiation oncology tomorrow.  Unfortunately with his altered mental status, requiring restraints, has not really improved over the last several days, will not be a candidate for DC to SNF soon. -Neurosurgery recommendations: continue Decadron and Keppra; recommending hospice as likely disposition -Palliative care followed up: DNR/DNI established, MOST form completed, patient would be interested in cancer treatment understanding that this will be palliative rather than curative.  Patient's brother also understands this. 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  I communicated with Dr. Lorenso Courier on 6/3 who indicated that they will arrange outpatient follow-up, likely at the [facility] Focal seizure Code stroke called on 5/29 with negative CT and MRI for stroke. EEG ordered and without evidence of seizure activity. 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 Started on [place] and other seizure restrictions to be advised when able.  Acute encephalopathy Likely multifactorial but most importantly related to brain mets with mass-effect, steroid use and seizures with postictal phase.  Currently on bilateral wrist restraints and Posey belt.  Delirium precautions.  Minimize opioid or sedative use.  Patient does not have the capacity to make medical decisions or leave AMA- informed RN of same.  Initiated as needed Ativan for agitation.  Avoiding Haldol given concern for seizures and reducing seizure threshold.  Able to carry out commands well i.e. blink eyes, stick out tongue, clap, or raise left hand etc. indicating significant element of expressive aphasia contributing.  Severe malnutrition Dietitian consulted. Underweight.  As  evidenced by bitemporal wasting and diffuse skeletal mass wasting, BMI of 15.92 and an albumin of  2.7.  Tobacco abuse Concern for possible COPD. Counseled.  Underweight Body mass index is 15.92 kg/m.   Elevated blood pressures As needed oral hydralazine.  Anemia of chronic disease Stable.  Follow CBC.   DVT prophylaxis: SCDs, Heparin subq Code Status:   Code Status: DNR Family Communication: I discussed in detail with patient's brother via phone on 6/2, updated care and answered all questions. Disposition Plan: Discharge to SNF in several days.  May require transfer to [facility] prior to discharge for radiation oncology management- will discuss with radiation oncology on Monday.   Consultants:   Medical oncology  Radiation oncology  Pulmonology  Neurosurgery  Neurology  Procedures:  1. PLACEMENT OF PERCUTANEOUS PEDICLE SCREWS T 10 - L 2 AND RESECTION OF THORACIC SPINE MASS with partial vertebrectomy and laminectomy with resection of epidural metastasis and decompression of thoracic spinal cord (05/25/2021)  Pathology:  FINAL MICROSCOPIC DIAGNOSIS:   A. THORACIC 12 TUMOR, RESECTION:  - Poorly differentiated carcinoma.   Antimicrobials:  None    Subjective: Denies complaints.  Today he indicates that he is in [place].  No acute issues as per nursing but ongoing intermittent agitation, pulling out IV lines and condom catheter.  Objective: Vitals:   06/01/21 1945 06/01/21 2311 06/02/21 0240 06/02/21 0705  BP: 122/88 (!) 136/93 (!) 136/94 (!) 126/91  Pulse: (!) 104 87 91 95  Resp: 16 16  15   Temp: 97.7 F (36.5 C) 97.8 F (36.6 C) 97.6 F (36.4 C) 97.8 F (36.6 C)  TempSrc: Oral Oral Oral Oral  SpO2: 97% 100% 98% 98%  Weight:      Height:        Intake/Output Summary (Last 24 hours) at 06/02/2021 1105 Last data filed at 06/01/2021 1812 Gross per 24 hour  Intake 480 ml  Output 700 ml  Net -220 ml   Filed Weights   05/24/21 2208  05/25/21 0601 05/25/21 1737  Weight: 46.1 kg 46.1 kg 46.1 kg    Examination:  General exam: Middle-age male, looks older than stated age, moderately built, frail, chronically ill looking and cachectic with bitemporal and diffuse muscle wasting.  Pleasant and cooperative this morning.  Lying comfortably in bed Respiratory system: Clear to auscultation.  No increased work of breathing. Cardiovascular system: S1 and S2 heard, RRR.  No JVD, murmurs or pedal edema. Gastrointestinal system: Abdomen is nondistended, soft and nontender. No organomegaly or masses felt. Normal bowel sounds heard. Central nervous system: Alert and oriented to self only and indicates that he is in [facility].  Suspect both receptive and expressive aphasia. Musculoskeletal: No edema. No calf tenderness.  Bilateral wrist soft restraints and Posey belt. Skin: No cyanosis. No rashes. Thoracic incision honeycomb dressing with some old blood saturation-as examined by prior MD, did not examine today. Psychiatry: Judgement and insight impaired. Memory impaired.   Data Reviewed: I have personally reviewed following labs and imaging studies  CBC Lab Results  Component Value Date   WBC 10.1 05/30/2021   RBC 3.86 (L) 05/30/2021   HGB 10.8 (L) 05/30/2021   HCT 32.7 (L) 05/30/2021   MCV 84.7 05/30/2021   MCH 28.0 05/30/2021   PLT 382 05/30/2021   MCHC 33.0 05/30/2021   RDW 13.4 05/30/2021   LYMPHSABS 1.3 05/24/2021   MONOABS 0.7 05/24/2021   EOSABS 0.0 05/24/2021   BASOSABS 0.0 22/97/9892     Last metabolic panel Lab Results  Component Value Date   NA 131 (L) 05/30/2021   K 4.2 05/30/2021  CL 93 (L) 05/30/2021   CO2 27 05/30/2021   BUN 20 05/30/2021   CREATININE 0.50 (L) 05/30/2021   GLUCOSE 104 (H) 05/30/2021   GFRNONAA >60 05/30/2021   CALCIUM 9.1 05/30/2021   PROT 7.7 05/24/2021   ALBUMIN 2.7 (L) 05/24/2021   BILITOT 0.9 05/24/2021   ALKPHOS 92 05/24/2021   AST 15 05/24/2021   ALT 11 05/24/2021    ANIONGAP 11 05/30/2021    CBG (last 3)  No results for input(s): GLUCAP in the last 72 hours.   GFR: Estimated Creatinine Clearance: 69.6 mL/min (A) (by C-G formula based on SCr of 0.5 mg/dL (L)).  Coagulation Profile: No results for input(s): INR, PROTIME in the last 168 hours.  Recent Results (from the past 240 hour(s))  Resp Panel by RT-PCR (Flu A&B, Covid) Nasopharyngeal Swab     Status: None   Collection Time: 05/24/21 11:51 AM   Specimen: Nasopharyngeal Swab; Nasopharyngeal(NP) swabs in vial transport medium  Result Value Ref Range Status   SARS Coronavirus 2 by RT PCR NEGATIVE NEGATIVE Final    Comment: (NOTE) SARS-CoV-2 target nucleic acids are NOT DETECTED.  The SARS-CoV-2 RNA is generally detectable in upper respiratory specimens during the acute phase of infection. The lowest concentration of SARS-CoV-2 viral copies this assay can detect is 138 copies/mL. A negative result does not preclude SARS-Cov-2 infection and should not be used as the sole basis for treatment or other patient management decisions. A negative result may occur with  improper specimen collection/handling, submission of specimen other than nasopharyngeal swab, presence of viral mutation(s) within the areas targeted by this assay, and inadequate number of viral copies(<138 copies/mL). A negative result must be combined with clinical observations, patient history, and epidemiological information. The expected result is Negative.  Fact Sheet for Patients:  EntrepreneurPulse.com.au  Fact Sheet for Healthcare Providers:  IncredibleEmployment.be  This test is no t yet approved or cleared by the [place] FDA and  has been authorized for detection and/or diagnosis of SARS-CoV-2 by FDA under an Emergency Use Authorization (EUA). This EUA will remain  in effect (meaning this test can be used) for the duration of the COVID-19 declaration under Section 564(b)(1) of  the Act, 21 U.S.C.section 360bbb-3(b)(1), unless the authorization is terminated  or revoked sooner.       Influenza A by PCR NEGATIVE NEGATIVE Final   Influenza B by PCR NEGATIVE NEGATIVE Final    Comment: (NOTE) The Xpert Xpress SARS-CoV-2/FLU/RSV plus assay is intended as an aid in the diagnosis of influenza from Nasopharyngeal swab specimens and should not be used as a sole basis for treatment. Nasal washings and aspirates are unacceptable for Xpert Xpress SARS-CoV-2/FLU/RSV testing.  Fact Sheet for Patients: EntrepreneurPulse.com.au  Fact Sheet for Healthcare Providers: IncredibleEmployment.be  This test is not yet approved or cleared by the [place] FDA and has been authorized for detection and/or diagnosis of SARS-CoV-2 by FDA under an Emergency Use Authorization (EUA). This EUA will remain in effect (meaning this test can be used) for the duration of the COVID-19 declaration under Section 564(b)(1) of the Act, 21 U.S.C. section 360bbb-3(b)(1), unless the authorization is terminated or revoked.  Performed at [facility], [address], [place], [place] [address]   Surgical pcr screen     Status: None   Collection Time: 05/25/21  6:02 PM   Specimen: Nasal Mucosa; Nasal Swab  Result Value Ref Range Status   MRSA, PCR NEGATIVE NEGATIVE Final   Staphylococcus aureus NEGATIVE NEGATIVE Final  Comment: (NOTE) The Xpert SA Assay (FDA approved for NASAL specimens in patients 71 years of age and older), is one component of a comprehensive surveillance program. It is not intended to diagnose infection nor to guide or monitor treatment. Performed at [facility], [place] [address], [place], [place] [address]         Radiology Studies: No results found.      Scheduled Meds: . (feeding supplement) PROSource Plus  30 mL Oral BID BM  . dexamethasone (DECADRON) injection  4 mg Intravenous Q6H   Or  . dexamethasone  4 mg  Oral Q6H  . docusate sodium  100 mg Oral BID  . feeding supplement  237 mL Oral TID BM  . heparin  5,000 Units Subcutaneous Q8H  . levETIRAcetam  500 mg Oral BID  . pantoprazole  40 mg Oral QHS  . sodium chloride flush  3 mL Intravenous Q12H   Continuous Infusions: . methocarbamol (ROBAXIN) IV       LOS: 9 days    Vernell Leep, MD, [place], [facility]. [facility]  To contact the attending provider between 7A-7P or the covering provider during after hours 7P-7A, please log into the web site [email or web address] and access using universal [place] password for that web site. If you do not have the password, please call the hospital operator.

## 2021-06-02 NOTE — Progress Notes (Signed)
NEUROSURGERY PROGRESS NOTE  S/p thoracolumbar fusion Doinf ok, no acute events overnight  Incision  CDI Continue therapies today   Temp:  [97.4 F (36.3 C)-98.3 F (36.8 C)] 97.8 F (36.6 C) (06/05 0705) Pulse Rate:  [87-104] 95 (06/05 0705) Resp:  [15-16] 15 (06/05 0705) BP: (121-136)/(84-94) 126/91 (06/05 0705) SpO2:  [97 %-100 %] 98 % (06/05 0705)   Dylan Chiquito, NP 06/02/2021 8:22 AM

## 2021-06-02 NOTE — Progress Notes (Signed)
Pt yelling profanity, and screaming at the nurses and staff. He is refusing care from staff. MD paged for IV ativan as pt refuses to take PO.

## 2021-06-03 ENCOUNTER — Ambulatory Visit
Admit: 2021-06-03 | Discharge: 2021-06-03 | Disposition: A | Discharge status: Home / Self Care | Payer: 59 | Attending: Radiation Oncology | Admitting: Radiation Oncology

## 2021-06-03 LAB — GLUCOSE, CAPILLARY: Glucose-Capillary: 82 mg/dL (ref 70–99)

## 2021-06-03 NOTE — Progress Notes (Signed)
Pt scheduled for transport via [facility] to [facility] for radiation at 1415 today. Shortly before transfer, order placed by Dr. Algis Liming to transfer pt to [facility]. Pt taken for radiation, and bed at [facility] became available after pt had left this unit. Report then called to [place].   Justice Rocher, RN

## 2021-06-03 NOTE — Progress Notes (Signed)
Subjective: Patient reports "I'm doing alright, I guess." He stated that his back pain is improving. RN reporting periods of agitation overnight requiring IV Ativan.   Objective: Vital signs in last 24 hours: Temp:  [97.5 F (36.4 C)-98.2 F (36.8 C)] (P) 97.9 F (36.6 C) (06/06 0440) Pulse Rate:  [86-100] 100 (06/05 1847) Resp:  [16-18] 16 (06/05 1847) BP: (127-144)/(84-100) 135/97 (06/06 0440) SpO2:  [98 %-100 %] 100 % (06/05 1847)  Intake/Output from previous day: 06/05 0701 - 06/06 0700 In: 480 [P.O.:480] Out: 700 [Urine:700] Intake/Output this shift: No intake/output data recorded.   Physical Exam: Patient is awake,alert, and O X self.He continues to state it is February and that he is in [facility].PERRL, EOMI.Speech is fluent and appropriate. MAEW.Sensation intact.DressingisCDI.  Lab Results: No results for input(s): WBC, HGB, HCT, PLT in the last 72 hours. BMET No results for input(s): NA, K, CL, CO2, GLUCOSE, BUN, CREATININE, CALCIUM in the last 72 hours.  Studies/Results: No results found.  Assessment/Plan: 55 y.o.man w/ metastatic diseasewho is post op day #9s/p thoracolumbar decompression and fusion.Aphasia has significantly improved. Neuro examination  is stable. Palliative care on board, patient is DNR. Overall prognosis is poor given the extent of his intracranial disease. 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  Oncology with plan to treat patient as outpatient, likely at the [facility] as communicated with Dr. Lorenso Courier on 6/3.  Course complicated by acute encephalopathy and intermittent agitation requiring restraints and PRN Ativan.Continue working on pain control, mobility and ambulating patient.Continue Keppra and Decadron. No new neurosurgical recommendations. Ok to discharge once medically appropriate.    LOS: 10 days     Marvis Moeller, DNP, NP-C 06/03/2021,  8:21 AM

## 2021-06-03 NOTE — Progress Notes (Signed)
I called and spoke with the patient's brother Aniceto Kyser his designated Air traffic controller. He is aware of the severity of his disease and recommendations for hospice care that came from our discussion in brain oncology conference this morning. We discussed pathology and that it would likely take months to see the benefit of brain radiation. Despite this, the patient's brother favors proceeding with whole brain radiation. We will plan to start this today via [place].     Carola Rhine, PAC

## 2021-06-03 NOTE — Progress Notes (Signed)
PT Cancellation Note  Patient Details Name: Dylan Shaw MRN: [id number] DOB: [birth year]   Cancelled Treatment:    Reason Eval/Treat Not Completed: Patient at procedure or test/unavailable this afternoon. He has been transferred to [facility] for continued treatment. Further progression of PT plan of care will be continued at [facility].   Karma Ganja, PT, DPT   Acute Rehabilitation Department Pager #: [phone]   Otho Bellows 06/03/2021, 3:49 PM

## 2021-06-03 NOTE — Progress Notes (Signed)
PROGRESS NOTE    Dylan Shaw  AOZ:[id number] DOB: 31-Mar-1966 DOA: 05/24/2021 PCP: Patient, No Pcp Per (Inactive)   Brief Narrative: Dylan Shaw is a 55 y.o. male with a history of smoking presented secondary to 50 pound weight loss, fatigue and low back pain. He was found to have evidence of a lung and spine mass concerning for metastatic cancer. He was started on decadron. During hospitalization he developed urinary and bowel incontinence leading to neurosurgical management and surgical decompression of the spine.  Medical/radiation oncology have seen with plans to treat.  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  Medical oncology will arrange outpatient follow-up, likely at the [facility] as communicated with Dr. Lorenso Courier on 6/3.  Course complicated by acute encephalopathy and intermittent agitation requiring restraints.  Received communication from the radiation oncology team.  Patient's case was discussed at brain oncology conference this morning with recommendations for hospice care.  PA then discussed with patient's brother including pathology and that it would likely take months to see benefit of the brain radiation.  Despite this, patient's brother favored proceeding with whole brain radiation.  Thereby patient being transferred to [facility] for starting whole brain radiation today.   Assessment & Plan:   Active Problems:   Thoracic spine tumor   Lung cancer ([place])   Protein calorie malnutrition ([place])   Metastatic cancer to spine ([place])   Brain metastasis ([place])   Protein-calorie malnutrition, severe   Lung mass, T12 soft tissue mass, metastatic malignancy to spine and brain, suspecting lung primary: Radiation/medical oncology consulted. Pulmonology initially consulted for pulmonary biopsy, I communicated with Dr. Chase Caller, PCCM on 6/1 who advised that they will be involved for EBUS if  biopsy of brain/spine is nondiagnostic. Neurosurgery consulted for spine mass. Patient underwent partial vertebrectomy and laminectomy with resection of mass on 5/28 by neurosurgery with specimen obtained for biopsy. MRI confirmed metastatic brain lesions with associated edema and 11 mm rightward midline shift anteriorly. PT/OT recommending SNF -Medical oncology recommendations: Plan for chemotherapy -Neurosurgery recommendations: continue Decadron and Keppra; recommending hospice as likely disposition -Palliative care followed up: DNR/DNI established, MOST form completed, patient would be interested in cancer treatment understanding that this will be palliative rather than curative.  Patient's brother also understands this. 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  I communicated with Dr. Lorenso Courier on 6/3 who indicated that they will arrange outpatient follow-up, likely at the [place] communication from the radiation oncology team.  Patient's case was discussed at brain oncology conference this morning with recommendations for hospice care.  PA then discussed with patient's brother including pathology and that it would likely take months to see benefit of the brain radiation.  Despite this, patient's brother favored proceeding with whole brain radiation.  Thereby patient being transferred to [facility] for starting whole brain radiation today.  Aphasia Focal seizure Code stroke called on 5/29 with negative CT and MRI for stroke. EEG ordered and without evidence of seizure activity. 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 Started on [place] and other seizure restrictions to be advised when able.  Acute encephalopathy Likely multifactorial but most importantly related to brain mets with mass-effect, steroid use and seizures with postictal phase.  Currently on  bilateral wrist restraints and Posey belt.  Delirium precautions.  Minimize opioid or sedative use.  Patient does not have the capacity to make medical decisions  or leave AMA, nursing has been informed of same.  Initiated as needed Ativan for agitation.  Avoiding Haldol given concern for seizures and reducing seizure threshold.  Able to carry out commands well i.e. blink eyes, stick out tongue, clap, or raise left hand etc. indicating significant element of expressive aphasia contributing.  Severe malnutrition Dietitian consulted. Underweight.  As evidenced by bitemporal wasting and diffuse skeletal mass wasting, BMI of 15.92 and an albumin of 2.7.  Tobacco abuse Concern for possible COPD. Counseled.  Underweight Body mass index is 15.92 kg/m.   Elevated blood pressures As needed oral hydralazine.  Anemia of chronic disease Stable.  Follow CBC periodically.   DVT prophylaxis: SCDs, Heparin subq Code Status:   Code Status: DNR Family Communication: I discussed in detail with patient's brother via phone on 6/2, updated care and answered all questions. Disposition Plan: Transferring from [facility] to [facility] on 06/03/2021 to start whole brain radiation at the cancer center.  Eventually SNF when stable and can come off restraints.   Consultants:   Medical oncology  Radiation oncology  Pulmonology  Neurosurgery  Neurology  Procedures:  1. PLACEMENT OF PERCUTANEOUS PEDICLE SCREWS T 10 - L 2 AND RESECTION OF THORACIC SPINE MASS with partial vertebrectomy and laminectomy with resection of epidural metastasis and decompression of thoracic spinal cord (05/25/2021)  Pathology:  FINAL MICROSCOPIC DIAGNOSIS:   A. THORACIC 12 TUMOR, RESECTION:  - Poorly differentiated carcinoma.   Antimicrobials:  None    Subjective: No complaints.  "How are you doing?"  Objective: Vitals:   06/02/21 1847 06/03/21 0100 06/03/21 0440 06/03/21 0800  BP: 127/88 (!)  144/100 (!) 135/97 126/88  Pulse: 100   89  Resp: 16   18  Temp: 97.9 F (36.6 C) 98 F (36.7 C) (P) 97.9 F (36.6 C) 98.1 F (36.7 C)  TempSrc: Oral Oral (P) Oral Oral  SpO2: 100%     Weight:      Height:        Intake/Output Summary (Last 24 hours) at 06/03/2021 1122 Last data filed at 06/02/2021 1500 Gross per 24 hour  Intake 240 ml  Output 700 ml  Net -460 ml   Filed Weights   05/24/21 2208 05/25/21 0601 05/25/21 1737  Weight: 46.1 kg 46.1 kg 46.1 kg    Examination:  General exam: Middle-age male, looks older than stated age, moderately built, frail, chronically ill looking and cachectic with bitemporal and diffuse muscle wasting.  Pleasant and cooperative this morning.  Lying comfortably in bed Respiratory system: Clear to auscultation.  No increased work of breathing. Cardiovascular system: S1 and S2 heard, RRR.  No JVD, murmurs or pedal edema. Gastrointestinal system: Abdomen is nondistended, soft and nontender. No organomegaly or masses felt. Normal bowel sounds heard. Central nervous system: Alert and oriented to self only and continues to state that he is in [facility].  Suspect both receptive and expressive aphasia. Musculoskeletal: No edema. No calf tenderness.  Bilateral wrist soft restraints and Posey belt. Skin: No cyanosis. No rashes. Thoracic incision honeycomb dressing with some old blood saturation-as examined by prior MD, did not examine today. Psychiatry: Judgement and insight impaired. Memory impaired.   Data Reviewed: I have personally reviewed following labs and imaging studies  CBC Lab Results  Component Value Date   WBC 10.1 05/30/2021   RBC 3.86 (L) 05/30/2021   HGB 10.8 (L) 05/30/2021   HCT 32.7 (L) 05/30/2021   MCV 84.7 05/30/2021   MCH 28.0 05/30/2021  PLT 382 05/30/2021   MCHC 33.0 05/30/2021   RDW 13.4 05/30/2021   LYMPHSABS 1.3 05/24/2021   MONOABS 0.7 05/24/2021   EOSABS 0.0 05/24/2021   BASOSABS 0.0 05/24/2021     Last  metabolic panel Lab Results  Component Value Date   NA 131 (L) 05/30/2021   K 4.2 05/30/2021   CL 93 (L) 05/30/2021   CO2 27 05/30/2021   BUN 20 05/30/2021   CREATININE 0.50 (L) 05/30/2021   GLUCOSE 104 (H) 05/30/2021   GFRNONAA >60 05/30/2021   CALCIUM 9.1 05/30/2021   PROT 7.7 05/24/2021   ALBUMIN 2.7 (L) 05/24/2021   BILITOT 0.9 05/24/2021   ALKPHOS 92 05/24/2021   AST 15 05/24/2021   ALT 11 05/24/2021   ANIONGAP 11 05/30/2021    CBG (last 3)  No results for input(s): GLUCAP in the last 72 hours.   GFR: Estimated Creatinine Clearance: 69.6 mL/min (A) (by C-G formula based on SCr of 0.5 mg/dL (L)).  Coagulation Profile: No results for input(s): INR, PROTIME in the last 168 hours.  Recent Results (from the past 240 hour(s))  Resp Panel by RT-PCR (Flu A&B, Covid) Nasopharyngeal Swab     Status: None   Collection Time: 05/24/21 11:51 AM   Specimen: Nasopharyngeal Swab; Nasopharyngeal(NP) swabs in vial transport medium  Result Value Ref Range Status   SARS Coronavirus 2 by RT PCR NEGATIVE NEGATIVE Final    Comment: (NOTE) SARS-CoV-2 target nucleic acids are NOT DETECTED.  The SARS-CoV-2 RNA is generally detectable in upper respiratory specimens during the acute phase of infection. The lowest concentration of SARS-CoV-2 viral copies this assay can detect is 138 copies/mL. A negative result does not preclude SARS-Cov-2 infection and should not be used as the sole basis for treatment or other patient management decisions. A negative result may occur with  improper specimen collection/handling, submission of specimen other than nasopharyngeal swab, presence of viral mutation(s) within the areas targeted by this assay, and inadequate number of viral copies(<138 copies/mL). A negative result must be combined with clinical observations, patient history, and epidemiological information. The expected result is Negative.  Fact Sheet for Patients:   EntrepreneurPulse.com.au  Fact Sheet for Healthcare Providers:  IncredibleEmployment.be  This test is no t yet approved or cleared by the [place] FDA and  has been authorized for detection and/or diagnosis of SARS-CoV-2 by FDA under an Emergency Use Authorization (EUA). This EUA will remain  in effect (meaning this test can be used) for the duration of the COVID-19 declaration under Section 564(b)(1) of the Act, 21 U.S.C.section 360bbb-3(b)(1), unless the authorization is terminated  or revoked sooner.       Influenza A by PCR NEGATIVE NEGATIVE Final   Influenza B by PCR NEGATIVE NEGATIVE Final    Comment: (NOTE) The Xpert Xpress SARS-CoV-2/FLU/RSV plus assay is intended as an aid in the diagnosis of influenza from Nasopharyngeal swab specimens and should not be used as a sole basis for treatment. Nasal washings and aspirates are unacceptable for Xpert Xpress SARS-CoV-2/FLU/RSV testing.  Fact Sheet for Patients: EntrepreneurPulse.com.au  Fact Sheet for Healthcare Providers: IncredibleEmployment.be  This test is not yet approved or cleared by the [place] FDA and has been authorized for detection and/or diagnosis of SARS-CoV-2 by FDA under an Emergency Use Authorization (EUA). This EUA will remain in effect (meaning this test can be used) for the duration of the COVID-19 declaration under Section 564(b)(1) of the Act, 21 U.S.C. section 360bbb-3(b)(1), unless the authorization is terminated or revoked.  Performed at [facility], [address], [place], [place] [address]   Surgical pcr screen     Status: None   Collection Time: 05/25/21  6:02 PM   Specimen: Nasal Mucosa; Nasal Swab  Result Value Ref Range Status   MRSA, PCR NEGATIVE NEGATIVE Final   Staphylococcus aureus NEGATIVE NEGATIVE Final    Comment: (NOTE) The Xpert SA Assay (FDA approved for NASAL specimens in patients 29 years of  age and older), is one component of a comprehensive surveillance program. It is not intended to diagnose infection nor to guide or monitor treatment. Performed at [facility], [place] [address] [place], [place] [address]         Radiology Studies: No results found.      Scheduled Meds: . (feeding supplement) PROSource Plus  30 mL Oral BID BM  . dexamethasone (DECADRON) injection  4 mg Intravenous Q6H   Or  . dexamethasone  4 mg Oral Q6H  . docusate sodium  100 mg Oral BID  . feeding supplement  237 mL Oral TID BM  . heparin  5,000 Units Subcutaneous Q8H  . levETIRAcetam  500 mg Oral BID  . pantoprazole  40 mg Oral QHS  . sodium chloride flush  3 mL Intravenous Q12H   Continuous Infusions: . methocarbamol (ROBAXIN) IV       LOS: 10 days    Vernell Leep, MD, [place], [facility]. [facility]  To contact the attending provider between 7A-7P or the covering provider during after hours 7P-7A, please log into the web site [email or web address] and access using universal [place] password for that web site. If you do not have the password, please call the hospital operator.

## 2021-06-04 ENCOUNTER — Ambulatory Visit
Admit: 2021-06-04 | Discharge: 2021-06-04 | Disposition: A | Discharge status: Home / Self Care | Payer: 59 | Attending: Radiation Oncology | Admitting: Radiation Oncology

## 2021-06-04 LAB — GLUCOSE, CAPILLARY
Glucose-Capillary: 84 mg/dL (ref 70–99)
Glucose-Capillary: 102 mg/dL — ABNORMAL HIGH (ref 70–99)
Glucose-Capillary: 131 mg/dL — ABNORMAL HIGH (ref 70–99)
Glucose-Capillary: 128 mg/dL — ABNORMAL HIGH (ref 70–99)

## 2021-06-04 NOTE — Progress Notes (Signed)
Occupational Therapy Treatment Patient Details Name: Dylan Shaw MRN: [id number] DOB: [birth year] Today's Date: 06/04/2021    History of present illness 55 y.o. man w/ new metastatic disease s/p thoracolumbar decompression and fusion on 05/25/21. Now w/ expressive aphasia 5/29 , likely focal seizure. Oncology team gave options for hospice versus palliative whole brain radiation therapy.  Family interested to proceed with whole brain radiation treatment/palliative chemotherapy.  Patient transferred to [facility] for whole brain radiation on 06/04/2021.  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 smoker, 50 pound weight loss   OT comments  Patient completed the [place]'s Cognitive Exam: ACE III, English version 2012.  Pt scored a 6/100, indicating a profound cognitive deficit. Score complicated by pt aphasia which is primarily expressive but may be global. Goals written based on pt's scoring and on pt's ADL ability today with marked impulsivity and confusion with motor planning and sequencing observed. Pt is currently requiring assistance with ADLs including moderate assist with toileting, Min guard with LE dressing, Minimal assist with bathing, and Min guard to supervision assist with all mobility and ADLs, all of which is below patient's typical baseline.  During this evaluation, patient was limited by possible global aphasia, confusion, impulsivity, and generalized weakness, which has the potential to impact patient's safety and independence during functional mobility, as well as performance for ADLs. [place] "6-clicks" Daily Activity Inpatient Short Form score of 17/24 indicates 50.11% ADL impairment this session. Patient demonstrates fair rehab potential, and may benefit from continued skilled occupational therapy services while in acute care to maximize safety, independence and quality of life at home.  Continued occupational therapy services in a SNF setting prior  to return home is recommended.  ?   Follow Up Recommendations  SNF    Equipment Recommendations  3 in 1 bedside commode    Recommendations for Other Services Speech consult    Precautions / Restrictions Precautions Precautions: Back Precaution Booklet Issued: Yes (comment) Precaution Comments: handout in room Restrictions Weight Bearing Restrictions: No       Mobility Bed Mobility Overal bed mobility: Needs Assistance Bed Mobility: Supine to Sit   Sidelying to sit: Supervision;HOB elevated       General bed mobility comments: up in recliner    Transfers Overall transfer level: Needs assistance Equipment used: None Transfers: Sit to/from Stand Sit to Stand: Min guard         General transfer comment: minG for safety.    Balance Overall balance assessment: Needs assistance Sitting-balance support: Feet supported Sitting balance-Leahy Scale: Fair Sitting balance - Comments: Impulsive and needs supervision.   Standing balance support: During functional activity;Single extremity supported Standing balance-Leahy Scale: Poor Standing balance comment: Impaired safety awareness and impulsive.                           ADL either performed or assessed with clinical judgement   ADL   Eating/Feeding: Set up;Sitting;Supervision/ safety   Grooming: Supervision/safety;Set up;Sitting;Wash/dry hands               Lower Body Dressing: Set up;Supervision/safety Lower Body Dressing Details (indicate cue type and reason): able to figure 4 cross and don socks while sitting EOB with supervision.     Toileting- Water quality scientist and Hygiene: Sit to/from stand;Moderate assistance;Cueing for sequencing;Cueing for safety;With adaptive equipment Toileting - Clothing Manipulation Details (indicate cue type and reason): Pt assisted out of bed and pt impulsively stood from  EOB while holding his penis. Pt began to ambulate to doorway of room with his penis in  hand. Pt was reoriented to situation and pt reported, "I know, I need to pee". Pt then peed across room. OT quickly brought urinal to appropraite place and pt was re-educated to use of urinal. Pt then assist to chair for safety.     Functional mobility during ADLs: Min guard;Supervision/safety;Cueing for safety;Cueing for sequencing       Vision   Additional Comments: Pt unable to read several largely printed and bold simple words outloud. Pt able to read "health" from pt's badge when prompted that first word is "[facility]" as pt was unable to identify it.  Pt unable to read clock on wall correctly. 2 Fingers held about 8' in front of pt. Pt reported seeing 4 fingers. Pt then asked to hold up his hand and mirror the fingers he saw. OT held up 2 and pt held up 4, each trial.  Also see results of ACE III Cognitive exam.   Perception     Praxis      Cognition Arousal/Alertness: Awake/alert Behavior During Therapy: Restless;Impulsive Overall Cognitive Status: Impaired/Different from baseline Area of Impairment: Orientation;Memory;Safety/judgement;Awareness                 Orientation Level: Disoriented to;Place;Situation;Time   Memory: Decreased short-term memory;Decreased recall of precautions   Safety/Judgement: Decreased awareness of deficits Awareness: Intellectual   General Comments: pt able to follow simple 1-step commands; continues to have difficulty with multi-step commands due to decreased memory and attention. Oriented to self only        Exercises Other Exercises Other Exercises: Pt completed [place]'s Cognitive Exam: ACE III, English version 2012.  Pt scored a 6/100, indicating a proufound cognitive deficit. Score complicated by pt aphasia which is primarily expressive but may be global.   Shoulder Instructions       General Comments      Pertinent Vitals/ Pain       Pain Assessment: No/denies pain Faces Pain Scale: No hurt  Home Living                                           Prior Functioning/Environment              Frequency  Min 2X/week        Progress Toward Goals  OT Goals(current goals can now be found in the care plan section)  Progress towards OT goals: Goals drowngraded-see care plan  Acute Rehab OT Goals OT Goal Formulation: Patient unable to participate in goal setting Potential to Achieve Goals: Fair ADL Goals Pt Will Perform Grooming: standing;with supervision Pt Will Perform Upper Body Bathing: with supervision Pt Will Perform Lower Body Bathing: with supervision Pt Will Transfer to Toilet: with supervision Pt Will Perform Toileting - Clothing Manipulation and hygiene: with supervision Pt/caregiver will Perform Home Exercise Program: With Supervision;Increased ROM;Increased strength;Both right and left upper extremity (Following instructions accurately 75% of time.) Additional ADL Goal #1: Pt will show improved mentation by increasing score on ACE III Cognitive exam to at least 20/100 and will follow 2-step commands 50% of the time.  Plan Discharge plan remains appropriate    Co-evaluation                 AM-PAC OT "6 Clicks" Daily Activity     Outcome Measure  Help from another person eating meals?: A Little Help from another person taking care of personal grooming?: A Little Help from another person toileting, which includes using toliet, bedpan, or urinal?: A Lot Help from another person bathing (including washing, rinsing, drying)?: A Little Help from another person to put on and taking off regular upper body clothing?: A Little Help from another person to put on and taking off regular lower body clothing?: A Little 6 Click Score: 17    End of Session    OT Visit Diagnosis: Unsteadiness on feet (R26.81);Muscle weakness (generalized) (M62.81);Cognitive communication deficit (R41.841)   Activity Tolerance Patient tolerated treatment well   Patient Left in chair;with  call bell/phone within reach;with chair alarm set   Nurse Communication Mobility status;Precautions (In chair.  PT into room to work with pt.)        Time: 5183-3582 OT Time Calculation (min): 40 min  Charges: OT General Charges $OT Visit: 1 Visit OT Treatments $Self Care/Home Management : 8-22 mins $Cognitive Funtion inital: Initial 15 mins $Cognitive Funtion additional: Additional15 mins  Anderson [place], [place] Office: [phone] 06/04/2021   Julien Girt 06/04/2021, 1:30 PM

## 2021-06-04 NOTE — Progress Notes (Signed)
PROGRESS NOTE    Dylan Shaw  WPV:[id number] DOB: 08/27/1966 DOA: 05/24/2021 PCP: Patient, No Pcp Per (Inactive)   Chief Complain: Weight loss, fatigue, back pain  Brief Narrative: Patient is a 55 year old male with history of tobacco use who presented initially with complaints of 50 pound weight loss, fatigue, low back pain.  He was found to have evidence of lung and spine mass concerning for metastatic cancer.  Started on Decadron.  During this hospitalization he developed urinary and bowel incontinence leading to neurosurgical management and surgical decompression of the spine.  Oncology/radiation oncology consulted.  Surgical pathology showed poorly differentiated  Lung adenocarcinoma .  Hospital course complicated by acute encephalopathy/intermittent agitation requiring restraints.  Oncology team gave options for hospice versus palliative whole brain radiation therapy.  Family interested to proceed with whole brain radiation treatment/palliative chemotherapy.  Patient transferred to [facility] for whole brain radiation on 06/04/2021  Assessment & Plan:   Active Problems:   Thoracic spine tumor   Lung cancer ([place])   Protein calorie malnutrition ([place])   Metastatic cancer to spine ([place])   Brain metastasis ([place])   Protein-calorie malnutrition, severe   Lung mass/T12 soft tissue mass/metastatic lung cancer to spine/brain: Oncology/radiation oncology were closely following.  Pulmonologyy was also initially consulted for consideration of lung biopsy, neurosurgery consulted  for spine mass.  Patient underwent vertebrectomy/laminectomy with resection of mass on 5/28 by neurosurgery.  Surgical pathology showed poorly differentiated carcinoma with lung as the most likely primary.  MRI confirmed metastatic brain lesions with extra edema, 11 mm rightward midline shift.  Neurosurgery recommended to continue Decadron and Keppra. Palliative  care was also consulted during this hospitalization.  Patient  is currently DNR.  After  discussion, patient and family were interested on seeking cancer treatment.  Decision was made to start whole brain radiation treatment and was transferred to [facility].  Patient will follow up with oncology team for palliative chemotherapy as an outpatient at [facility].  Altered mental status: Metabolic encephalopathy secondary to brain mets, mass-effect, steroid use, seizure with postictal phase. He was on  soft restraints,now off.  Continue monitoring mental status.  Delirium precautions.  Minimize opioids/sedatives.  Patient does not have capacity to make decisions so cannot leave AMA.  Brother makes the decision for the patient.  Avoiding Haldol for the concern of seizure/reducing seizure threshold.  He is awake and alert but not oriented  Aphasia/focal seizure: Code stroke was called on 5/29 with negative CT/MRI for stroke.  EEG did not show any seizure activity.  Neurology was also consulted.  Currently on Keppra.  Severe protein calorie moderation: Nutrition following.  Has bitemporal wasting.  BMI 15.9  Tobacco use: Smoking cessation was counseled.  Concern for possible COPD  Hypertension: No history of hypertension in the past.  Continue.  Medications for severe hypertension.  Monitor blood pressure.  Normocytic anemia: Most likely associated with malignancy/chronic disease.  Monitor CBC intermittently.  Hemoglobin stable.  Debility/deconditioning: PT/OT recommending skilled nursing facility on discharge.  TOC consulted.    Nutrition Problem: Severe Malnutrition Etiology: chronic illness (lung cancer with metastasis to spine and brain)      DVT prophylaxis: Heparin [place] Code Status: DNR Family Communication:Called and discussed with brother on phone today Status is: Inpatient  Remains inpatient appropriate because:Inpatient level of care appropriate due to severity of illness   Dispo: The patient is from: Home              Anticipated d/c is to:  SNF              Patient currently is not medically stable to d/c.   Difficult to place patient No    Consultants: Neurosurgery, oncology, radiation oncology  Procedures: As above  Antimicrobials:  Anti-infectives (From admission, onward)   Start     Dose/Rate Route Frequency Ordered Stop   05/26/21 0500  ceFAZolin (ANCEF) IVPB 2g/100 mL premix        2 g 200 mL/hr over 30 Minutes Intravenous Every 8 hours 05/26/21 0411 05/26/21 1256      Subjective:  Patient seen and examined the bedside this morning.  Hemodynamically stable.  Comfortable.  Off restraints.  He said he wants to go home.  Denies any complaints, alert and awake but not oriented   Objective: Vitals:   06/03/21 1500 06/03/21 1855 06/03/21 2303 06/04/21 0616  BP: 114/90 109/81 (!) 141/88 (!) 124/94  Pulse: 99 96 98 98  Resp: 18 20 16 17   Temp: 98.4 F (36.9 C) (!) 97.5 F (36.4 C) 98 F (36.7 C) 97.8 F (36.6 C)  TempSrc: Oral Oral    SpO2: 97% 100% 99% 100%  Weight:      Height:       No intake or output data in the 24 hours ending 06/04/21 0826 Filed Weights   05/24/21 2208 05/25/21 0601 05/25/21 1737  Weight: 46.1 kg 46.1 kg 46.1 kg    Examination:  General exam: Very deconditioned, debilitated, malnourished HEENT:PERRL,Oral mucosa moist, Ear/Nose normal on gross exam Respiratory system: Diminished air entry bilaterally, no wheezes or crackles cardiovascular system: S1 & S2 heard, RRR. No JVD, murmurs, rubs, gallops or clicks. No pedal edema. Gastrointestinal system: Abdomen is nondistended, soft and nontender. No organomegaly or masses felt. Normal bowel sounds heard. Central nervous system: Alert and awake but not oriented to time or place Extremities: No edema, no clubbing ,no cyanosis Skin: No rashes, lesions or ulcers,no icterus ,no pallor   Data Reviewed: I have personally reviewed following labs and imaging studies  CBC: Recent Labs  Lab 05/30/21 0406  WBC 10.1  HGB 10.8*  HCT  32.7*  MCV 84.7  PLT 701   Basic Metabolic Panel: Recent Labs  Lab 05/30/21 0406  NA 131*  K 4.2  CL 93*  CO2 27  GLUCOSE 104*  BUN 20  CREATININE 0.50*  CALCIUM 9.1   GFR: Estimated Creatinine Clearance: 69.6 mL/min (A) (by C-G formula based on SCr of 0.5 mg/dL (L)). Liver Function Tests: No results for input(s): AST, ALT, ALKPHOS, BILITOT, PROT, ALBUMIN in the last 168 hours. No results for input(s): LIPASE, AMYLASE in the last 168 hours. No results for input(s): AMMONIA in the last 168 hours. Coagulation Profile: No results for input(s): INR, PROTIME in the last 168 hours. Cardiac Enzymes: No results for input(s): CKTOTAL, CKMB, CKMBINDEX, TROPONINI in the last 168 hours. BNP (last 3 results) No results for input(s): PROBNP in the last 8760 hours. HbA1C: No results for input(s): HGBA1C in the last 72 hours. CBG: Recent Labs  Lab 06/03/21 1628 06/04/21 0602  GLUCAP 82 84   Lipid Profile: No results for input(s): CHOL, HDL, LDLCALC, TRIG, CHOLHDL, LDLDIRECT in the last 72 hours. Thyroid Function Tests: No results for input(s): TSH, T4TOTAL, FREET4, T3FREE, THYROIDAB in the last 72 hours. Anemia Panel: No results for input(s): VITAMINB12, FOLATE, FERRITIN, TIBC, IRON, RETICCTPCT in the last 72 hours. Sepsis Labs: No results for input(s): PROCALCITON, LATICACIDVEN in the last 168 hours.  Recent Results (  from the past 240 hour(s))  Surgical pcr screen     Status: None   Collection Time: 05/25/21  6:02 PM   Specimen: Nasal Mucosa; Nasal Swab  Result Value Ref Range Status   MRSA, PCR NEGATIVE NEGATIVE Final   Staphylococcus aureus NEGATIVE NEGATIVE Final    Comment: (NOTE) The Xpert SA Assay (FDA approved for NASAL specimens in patients 1 years of age and older), is one component of a comprehensive surveillance program. It is not intended to diagnose infection nor to guide or monitor treatment. Performed at [facility], [place] [address], [place],  [place] [address]          Radiology Studies: No results found.      Scheduled Meds: . (feeding supplement) PROSource Plus  30 mL Oral BID BM  . dexamethasone (DECADRON) injection  4 mg Intravenous Q6H   Or  . dexamethasone  4 mg Oral Q6H  . docusate sodium  100 mg Oral BID  . feeding supplement  237 mL Oral TID BM  . heparin  5,000 Units Subcutaneous Q8H  . levETIRAcetam  500 mg Oral BID  . pantoprazole  40 mg Oral QHS  . sodium chloride flush  3 mL Intravenous Q12H   Continuous Infusions: . methocarbamol (ROBAXIN) IV       LOS: 11 days    Time spent: 35 mins.More than 50% of that time was spent in counseling and/or coordination of care.      Shelly Coss, MD [facility] P6/06/2021, 8:26 AM

## 2021-06-04 NOTE — Progress Notes (Signed)
Physical Therapy Treatment Patient Details Name: Dylan Shaw MRN: [id number] DOB: 12/31/1965 Today's Date: 06/04/2021    History of Present Illness 55 y.o. man w/ new metastatic disease s/p thoracolumbar decompression and fusion on 05/25/21. Now w/ expressive aphasia 5/29 , self-limited, likely focal seizure. 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 smoker, 50 pound weight loss    PT Comments    Pt oriented to self only, but is able to follow commands. He ambulated 125' x 2 with seated rest break, distance limited by fatigue. Pt required min assist for several episodes of loss of balance. Hands on assistance recommended for mobility.    Follow Up Recommendations  SNF;Supervision for mobility/OOB     Equipment Recommendations  Rolling walker with 5" wheels;3in1 (PT)    Recommendations for Other Services       Precautions / Restrictions Precautions Precautions: Back Precaution Booklet Issued: Yes (comment) Precaution Comments: handout in room Restrictions Weight Bearing Restrictions: No    Mobility  Bed Mobility               General bed mobility comments: up in recliner    Transfers Overall transfer level: Needs assistance Equipment used: None Transfers: Sit to/from Stand Sit to Stand: Min guard         General transfer comment: minG to power up, VC for hand placement  Ambulation/Gait Ambulation/Gait assistance: Min assist Gait Distance (Feet): 250 Feet Assistive device: None Gait Pattern/deviations: Step-through pattern;Scissoring;Drifts right/left;Wide base of support Gait velocity: decreased   General Gait Details: pt ambulated 125' x 2 with seated rest 2* fatigue. Min A for balance for LOB x 4. Balance declined with fatigue.   Stairs             Wheelchair Mobility    Modified Rankin (Stroke Patients Only)       Balance Overall balance assessment: Needs assistance Sitting-balance support: Feet supported Sitting  balance-Leahy Scale: Fair     Standing balance support: During functional activity;Single extremity supported Standing balance-Leahy Scale: Poor Standing balance comment: requires assist for dynamic balance tasks                            Cognition Arousal/Alertness: Awake/alert Behavior During Therapy: WFL for tasks assessed/performed Overall Cognitive Status: Impaired/Different from baseline Area of Impairment: Orientation;Memory;Safety/judgement;Awareness                 Orientation Level: Disoriented to;Place;Situation;Time   Memory: Decreased short-term memory;Decreased recall of precautions   Safety/Judgement: Decreased awareness of deficits Awareness: Intellectual   General Comments: pt able to follow simple commands and verbalizing agreement with plan, continues to have difficulty with multi-step commands due to decreased memory and attention. Oriented to self only      Exercises      General Comments        Pertinent Vitals/Pain Pain Assessment: No/denies pain Faces Pain Scale: No hurt    Home Living                      Prior Function            PT Goals (current goals can now be found in the care plan section)      Frequency    Min 3X/week      PT Plan Current plan remains appropriate    Co-evaluation              AM-PAC  PT "6 Clicks" Mobility   Outcome Measure  Help needed turning from your back to your side while in a flat bed without using bedrails?: A Little Help needed moving from lying on your back to sitting on the side of a flat bed without using bedrails?: A Little Help needed moving to and from a bed to a chair (including a wheelchair)?: A Little Help needed standing up from a chair using your arms (e.g., wheelchair or bedside chair)?: A Little Help needed to walk in hospital room?: A Little Help needed climbing 3-5 steps with a railing? : A Little 6 Click Score: 18    End of Session Equipment  Utilized During Treatment: Gait belt Activity Tolerance: Patient tolerated treatment well;No increased pain Patient left: in chair;with call bell/phone within reach;with chair alarm set Nurse Communication: Mobility status PT Visit Diagnosis: Unsteadiness on feet (R26.81)     Time: 6484-7207 PT Time Calculation (min) (ACUTE ONLY): 10 min  Charges:  $Gait Training: 8-22 mins                    Blondell Reveal Kistler PT 06/04/2021  Acute Rehabilitation Services Pager [phone] Office [phone]

## 2021-06-04 NOTE — Progress Notes (Signed)
This writer went to check on pt, pt no longer in restraints and sitting in bed eating with no signs of agitation. Restraints kept off, will continue to monitor.

## 2021-06-05 ENCOUNTER — Ambulatory Visit: Payer: 59 | Admitting: Licensed Clinical Social Worker

## 2021-06-05 ENCOUNTER — Ambulatory Visit
Admit: 2021-06-05 | Discharge: 2021-06-05 | Disposition: A | Discharge status: Home / Self Care | Payer: 59 | Attending: Radiation Oncology | Admitting: Radiation Oncology

## 2021-06-05 LAB — CBC WITH DIFFERENTIAL/PLATELET
Abs Immature Granulocytes: 0.24 10*3/uL — ABNORMAL HIGH (ref 0.00–0.07)
Basophils Absolute: 0 10*3/uL (ref 0.0–0.1)
Basophils Relative: 0 %
Eosinophils Absolute: 0 10*3/uL (ref 0.0–0.5)
Eosinophils Relative: 0 %
HCT: 34.4 % — ABNORMAL LOW (ref 39.0–52.0)
Hemoglobin: 11.1 g/dL — ABNORMAL LOW (ref 13.0–17.0)
Immature Granulocytes: 1 %
Lymphocytes Relative: 9 %
Lymphs Abs: 1.5 10*3/uL (ref 0.7–4.0)
MCH: 28.8 pg (ref 26.0–34.0)
MCHC: 32.3 g/dL (ref 30.0–36.0)
MCV: 89.1 fL (ref 80.0–100.0)
Monocytes Absolute: 0.8 10*3/uL (ref 0.1–1.0)
Monocytes Relative: 5 %
Neutro Abs: 14.9 10*3/uL — ABNORMAL HIGH (ref 1.7–7.7)
Neutrophils Relative %: 85 %
Platelets: 461 10*3/uL — ABNORMAL HIGH (ref 150–400)
RBC: 3.86 MIL/uL — ABNORMAL LOW (ref 4.22–5.81)
RDW: 14.9 % (ref 11.5–15.5)
WBC: 17.5 10*3/uL — ABNORMAL HIGH (ref 4.0–10.5)
nRBC: 0 % (ref 0.0–0.2)

## 2021-06-05 LAB — GLUCOSE, CAPILLARY
Glucose-Capillary: 104 mg/dL — ABNORMAL HIGH (ref 70–99)
Glucose-Capillary: 90 mg/dL (ref 70–99)
Glucose-Capillary: 96 mg/dL (ref 70–99)
Glucose-Capillary: 93 mg/dL (ref 70–99)

## 2021-06-05 LAB — BASIC METABOLIC PANEL
Anion gap: 10 (ref 5–15)
BUN: 25 mg/dL — ABNORMAL HIGH (ref 6–20)
CO2: 25 mmol/L (ref 22–32)
Calcium: 9.2 mg/dL (ref 8.9–10.3)
Chloride: 100 mmol/L (ref 98–111)
Creatinine, Ser: 0.55 mg/dL — ABNORMAL LOW (ref 0.61–1.24)
GFR, Estimated: >60 mL/min (ref >60)
Glucose, Bld: 101 mg/dL — ABNORMAL HIGH (ref 70–99)
Potassium: 4.5 mmol/L (ref 3.5–5.1)
Sodium: 135 mmol/L (ref 135–145)

## 2021-06-05 MED ORDER — LORAZEPAM 0.5 MG PO TABS
0.5000 mg | ORAL_TABLET | Freq: Four times a day (QID) | ORAL | Status: DC | PRN
  Administered 2021-06-05 – 2021-06-26 (×15): 0.5 mg via ORAL
  Filled 2021-06-05 (×15): qty 1

## 2021-06-05 MED ORDER — DEXAMETHASONE 4 MG PO TABS
4.0000 mg | ORAL_TABLET | Freq: Two times a day (BID) | ORAL | Status: DC
  Administered 2021-06-05 – 2021-06-20 (×30): 4 mg via ORAL
  Filled 2021-06-05 (×30): qty 1

## 2021-06-05 NOTE — Progress Notes (Signed)
   06/04/21 1941  Assess: MEWS Score  Temp (!) 97.5 F (36.4 C)  BP 121/81  Pulse Rate (!) 104  Resp (!) 22  Level of Consciousness Alert  SpO2 98 %  O2 Device Room Air  Assess: MEWS Score  MEWS Temp 0  MEWS Systolic 0  MEWS Pulse 1  MEWS RR 1  MEWS LOC 0  MEWS Score 2  MEWS Score Color Yellow  Assess: if the MEWS score is Yellow or Red  Were vital signs taken at a resting state? Yes  Focused Assessment No change from prior assessment  Does the patient meet 2 or more of the SIRS criteria? No  Does the patient have a confirmed or suspected source of infection? No  MEWS guidelines implemented *See Row Information* Yes  Treat  MEWS Interventions Administered scheduled meds/treatments  Pain Scale 0-10  Pain Score 0  Take Vital Signs  Increase Vital Sign Frequency  Yellow: Q 2hr X 2 then Q 4hr X 2, if remains yellow, continue Q 4hrs  Escalate  MEWS: Escalate Yellow: discuss with charge nurse/RN and consider discussing with provider and RRT  Notify: Charge Nurse/RN  Name of Charge Nurse/RN Notified Tom RN  Date Charge Nurse/RN Notified 06/04/21  Time Charge Nurse/RN Notified 1945  Document  Patient Outcome Stabilized after interventions  Progress note created (see row info) Yes  Assess: SIRS CRITERIA  SIRS Temperature  0  SIRS Pulse 1  SIRS Respirations  1  SIRS WBC 0  SIRS Score Sum  2  Charge nurse Tom RN notified. Patient evaluated and is appropriate at baseline. No changes in patient will continue to monitor and implement MEWS guidelines.

## 2021-06-05 NOTE — Progress Notes (Addendum)
Nutrition Follow-up  DOCUMENTATION CODES:   Underweight,Severe malnutrition in context of chronic illness  INTERVENTION:   -Prosource Plus PO BID, each provides 100 kcals and 15g protein  -Ensure Enlive po TID, each supplement provides 350 kcal and 20 grams of protein  -Magic cup TID with meals, each supplement provides 290 kcal and 9 grams of protein  NUTRITION DIAGNOSIS:   Severe Malnutrition related to chronic illness (lung cancer with metastasis to spine and brain) as evidenced by severe muscle depletion,severe fat depletion.  Ongoing.  GOAL:   Patient will meet greater than or equal to 90% of their needs  Progressing.  MONITOR:   PO intake,Supplement acceptance,Weight trends,Labs,I & O's  ASSESSMENT:   Patient with PMH significant for tobacco use. Presents this admission with T12 metastatic lesion with epidural extension (presumed lung primary).  Pt is alert/oriented x1. Patient is currently consuming 90-100% of meals. Pt is also accepting protein supplements.  Per MD note, pt is medically stable for discharge to SNF.  Admission weight: 101 lbs. No other weights this admission.  Medications: Colace  Labs reviewed: CBGs: 90-131  Diet Order:   Diet Order            Diet regular Room service appropriate? No; Fluid consistency: Thin  Diet effective now                 EDUCATION NEEDS:   Not appropriate for education at this time  Skin:  Skin Assessment: Skin Integrity Issues: Skin Integrity Issues:: Incisions Incisions: back  Last BM:  6/7 -type 4  Height:   Ht Readings from Last 1 Encounters:  05/25/21 5\' 7"  (1.702 m)    Weight:   Wt Readings from Last 1 Encounters:  05/25/21 46.1 kg   BMI:  Body mass index is 15.92 kg/m.  Estimated Nutritional Needs:   Kcal:  1500-1700 kcal  Protein:  75-90 grams  Fluid:  >/= 1.5 L/day  Clayton Bibles, MS, RD, LDN Inpatient Clinical Dietitian Contact information available via Amion

## 2021-06-05 NOTE — Progress Notes (Addendum)
PROGRESS NOTE    Dylan Shaw  OTL:[id number] DOB: 26-Aug-1966 DOA: 05/24/2021 PCP: Patient, No Pcp Per (Inactive)   Chief Complain: Weight loss, fatigue, back pain  Brief Narrative: Patient is a 55 year old male with history of tobacco use who presented initially with complaints of 50 pound weight loss, fatigue, low back pain.  He was found to have evidence of lung and spine mass concerning for metastatic cancer.  Started on Decadron.  During this hospitalization he developed urinary and bowel incontinence leading to neurosurgical management and surgical decompression of the spine.  Oncology/radiation oncology consulted.  Surgical pathology showed poorly differentiated  Lung adenocarcinoma .  Hospital course complicated by acute encephalopathy/intermittent agitation requiring restraints.  Oncology team gave options for hospice versus palliative whole brain radiation therapy.  Family interested to proceed with whole brain radiation treatment/palliative chemotherapy.  Patient transferred to [facility] for whole brain radiation on 06/04/2021.  Patient was seen by PT and OT and recommended skilled nursing facility discharge.  He is medically stable for discharge to skilled nursing facility as soon as bed is available if he can be transported for radiation therapy here from skilled nursing facility  Assessment & Plan:   Active Problems:   Thoracic spine tumor   Lung cancer ([place])   Protein calorie malnutrition ([place])   Metastatic cancer to spine ([place])   Brain metastasis ([place])   Protein-calorie malnutrition, severe   Lung mass/T12 soft tissue mass/metastatic lung cancer to spine/brain: Oncology/radiation oncology were closely following.  Pulmonologyy was also initially consulted for consideration of lung biopsy, neurosurgery consulted  for spine mass.  Patient underwent vertebrectomy/laminectomy with resection of mass on 5/28 by neurosurgery.  Surgical pathology showed poorly differentiated carcinoma  with lung as the most likely primary.  MRI confirmed metastatic brain lesions with extra edema, 11 mm rightward midline shift.  Neurosurgery recommended to continue Decadron and Keppra. Palliative  care was also consulted during this hospitalization.  Patient is currently DNR.  After  discussion, patient and family were interested on seeking cancer treatment.  Decision was made to start whole brain radiation treatment and was transferred to [facility].  Patient will follow up with oncology team for palliative chemotherapy as an [facility].  Altered mental status: Metabolic encephalopathy secondary to brain mets, mass-effect, steroid use, seizure with postictal phase. He was on  soft restraints,now off.  Continue monitoring mental status.  Delirium precautions.  Minimize opioids/sedatives.  Patient does not have capacity to make decisions so cannot leave AMA.  Brother makes the decision for the patient.  Avoiding Haldol for the concern of seizure/reducing seizure threshold.  He is awake and alert but not oriented  Leukocytosis : No signs of infection.  Most likely associated with Decadron.  Continue monitoring  Aphasia/focal seizure: Code stroke was called on 5/29 with negative CT/MRI for stroke.  EEG did not show any seizure activity.  Neurology was also consulted.  Currently on Keppra.  Severe protein calorie moderation: Nutrition following.  Has bitemporal wasting.  BMI 15.9  Tobacco use: Smoking cessation was counseled.  Concern for possible COPD  Hypertension: No history of hypertension in the past.  Continue.  Medications for severe hypertension.  Monitor blood pressure.  Normocytic anemia: Most likely associated with malignancy/chronic disease.  Monitor CBC intermittently.  Hemoglobin stable.  Debility/deconditioning: PT/OT recommending skilled nursing facility on discharge.  TOC consulted.    Nutrition Problem: Severe Malnutrition Etiology: chronic illness (lung cancer with  metastasis to spine and brain)  DVT prophylaxis: Heparin  Code Status: DNR Family Communication:Called and discussed with brother on phone on 06/04/21 Status is: Inpatient  Remains inpatient appropriate because:Inpatient level of care appropriate due to severity of illness   Dispo: The patient is from: Home              Anticipated d/c is to: SNF              Patient currently is not medically stable to d/c.   Difficult to place patient No    Consultants: Neurosurgery, oncology, radiation oncology  Procedures: As above  Antimicrobials:  Anti-infectives (From admission, onward)   Start     Dose/Rate Route Frequency Ordered Stop   05/26/21 0500  ceFAZolin (ANCEF) IVPB 2g/100 mL premix        2 g 200 mL/hr over 30 Minutes Intravenous Every 8 hours 05/26/21 0411 05/26/21 1256      Subjective:  Patient seen and examined at the bedside this morning.  Alert and awake, comfortable.  Says he wants to go home.  He obeys commands but is not oriented.  Objective: Vitals:   06/04/21 1941 06/05/21 0059 06/05/21 0301 06/05/21 0608  BP: 121/81 (!) 123/91 119/83 129/83  Pulse: (!) 104 89 97 100  Resp: (!) 22 18 20 18   Temp: (!) 97.5 F (36.4 C) 98.3 F (36.8 C) 98.4 F (36.9 C) 98.1 F (36.7 C)  TempSrc: Oral Oral    SpO2: 98% 100% 100% 96%  Weight:      Height:        Intake/Output Summary (Last 24 hours) at 06/05/2021 0842 Last data filed at 06/04/2021 1251 Gross per 24 hour  Intake 360 ml  Output --  Net 360 ml   Filed Weights   05/24/21 2208 05/25/21 0601 05/25/21 1737  Weight: 46.1 kg 46.1 kg 46.1 kg    Examination:  General exam: Overall comfortable, not in distress, very deconditioned/debilitated, malnourished HEENT: PERRL Respiratory system:  no wheezes or crackles  Cardiovascular system: S1 & S2 heard, RRR.  Gastrointestinal system: Abdomen is nondistended, soft and nontender. Central nervous system: Alert and awake but not oriented Extremities: No  edema, no clubbing ,no cyanosis Skin: No rashes, no ulcers,no icterus    Data Reviewed: I have personally reviewed following labs and imaging studies  CBC: Recent Labs  Lab 05/30/21 0406 06/05/21 0532  WBC 10.1 17.5*  NEUTROABS  --  14.9*  HGB 10.8* 11.1*  HCT 32.7* 34.4*  MCV 84.7 89.1  PLT 382 517*   Basic Metabolic Panel: Recent Labs  Lab 05/30/21 0406 06/05/21 0532  NA 131* 135  K 4.2 4.5  CL 93* 100  CO2 27 25  GLUCOSE 104* 101*  BUN 20 25*  CREATININE 0.50* 0.55*  CALCIUM 9.1 9.2   GFR: Estimated Creatinine Clearance: 69.6 mL/min (A) (by C-G formula based on SCr of 0.55 mg/dL (L)). Liver Function Tests: No results for input(s): AST, ALT, ALKPHOS, BILITOT, PROT, ALBUMIN in the last 168 hours. No results for input(s): LIPASE, AMYLASE in the last 168 hours. No results for input(s): AMMONIA in the last 168 hours. Coagulation Profile: No results for input(s): INR, PROTIME in the last 168 hours. Cardiac Enzymes: No results for input(s): CKTOTAL, CKMB, CKMBINDEX, TROPONINI in the last 168 hours. BNP (last 3 results) No results for input(s): PROBNP in the last 8760 hours. HbA1C: No results for input(s): HGBA1C in the last 72 hours. CBG: Recent Labs  Lab 06/04/21 0602 06/04/21 1249 06/04/21 1727 06/04/21 2007  06/05/21 0615  GLUCAP 84 102* 131* 128* 104*   Lipid Profile: No results for input(s): CHOL, HDL, LDLCALC, TRIG, CHOLHDL, LDLDIRECT in the last 72 hours. Thyroid Function Tests: No results for input(s): TSH, T4TOTAL, FREET4, T3FREE, THYROIDAB in the last 72 hours. Anemia Panel: No results for input(s): VITAMINB12, FOLATE, FERRITIN, TIBC, IRON, RETICCTPCT in the last 72 hours. Sepsis Labs: No results for input(s): PROCALCITON, LATICACIDVEN in the last 168 hours.  No results found for this or any previous visit (from the past 240 hour(s)).       Radiology Studies: No results found.      Scheduled Meds: . (feeding supplement) PROSource  Plus  30 mL Oral BID BM  . dexamethasone (DECADRON) injection  4 mg Intravenous Q6H   Or  . dexamethasone  4 mg Oral Q6H  . docusate sodium  100 mg Oral BID  . feeding supplement  237 mL Oral TID BM  . heparin  5,000 Units Subcutaneous Q8H  . levETIRAcetam  500 mg Oral BID  . pantoprazole  40 mg Oral QHS  . sodium chloride flush  3 mL Intravenous Q12H   Continuous Infusions: . methocarbamol (ROBAXIN) IV       LOS: 12 days    Time spent: 25 mins.More than 50% of that time was spent in counseling and/or coordination of care.      Shelly Coss, MD [facility] P6/07/2021, 8:42 AM

## 2021-06-05 NOTE — Progress Notes (Signed)
Physical Therapy Treatment Patient Details Name: Dylan Shaw MRN: [id number] DOB: 1966-11-18 Today's Date: 06/05/2021    History of Present Illness 55 y.o. man w/ new metastatic disease s/p thoracolumbar decompression and fusion on 05/25/21. Now w/ expressive aphasia 5/29 , self-limited, likely focal seizure. 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 smoker, 50 pound weight loss    PT Comments    Pt ambulated 250' with RW, no overt loss of balance but had occaissional scissoring. Pt was less fatigued using RW today in comparison to walking without an assistive device yesterday. Supervision for mobility recommended due to cognitive deficits.    Follow Up Recommendations  SNF;Supervision for mobility/OOB     Equipment Recommendations  Rolling walker with 5" wheels;3in1 (PT)    Recommendations for Other Services       Precautions / Restrictions Precautions Precautions: Back Precaution Booklet Issued: Yes (comment) Precaution Comments: handout in room Restrictions Weight Bearing Restrictions: No    Mobility  Bed Mobility               General bed mobility comments: up in recliner with hips scooted far forward nearly to edge of seat, pt required assist to scoot hips back    Transfers Overall transfer level: Needs assistance Equipment used: Rolling walker (2 wheeled) Transfers: Sit to/from Stand Sit to Stand: Min guard         General transfer comment: minG for safety.  Ambulation/Gait Ambulation/Gait assistance: Min guard Gait Distance (Feet): 250 Feet Assistive device: Rolling walker (2 wheeled) Gait Pattern/deviations: Step-through pattern;Scissoring;Drifts right/left;Wide base of support Gait velocity: decreased   General Gait Details: decreased fatigue with use of RW today, no loss of balance, 7/10 pain with ambulation   Stairs             Wheelchair Mobility    Modified Rankin (Stroke Patients Only)       Balance  Overall balance assessment: Needs assistance Sitting-balance support: Feet supported Sitting balance-Leahy Scale: Good     Standing balance support: During functional activity Standing balance-Leahy Scale: Fair Standing balance comment: requires assist for dynamic balance tasks                            Cognition Arousal/Alertness: Awake/alert Behavior During Therapy: Restless;Impulsive Overall Cognitive Status: Impaired/Different from baseline Area of Impairment: Orientation;Memory;Safety/judgement;Awareness                 Orientation Level: Disoriented to;Place;Situation;Time   Memory: Decreased short-term memory;Decreased recall of precautions   Safety/Judgement: Decreased awareness of deficits     General Comments: pt able to follow simple 1-step commands; continues to have difficulty with multi-step commands due to decreased memory and attention. Oriented to self only      Exercises      General Comments        Pertinent Vitals/Pain Pain Assessment: 0-10 Pain Score: 7  Pain Location: back Pain Descriptors / Indicators: Operative site guarding;Grimacing Pain Intervention(s): Limited activity within patient's tolerance;Monitored during session;Patient requesting pain meds-RN notified    Home Living                      Prior Function            PT Goals (current goals can now be found in the care plan section) Acute Rehab PT Goals Patient Stated Goal: to return home PT Goal Formulation: With patient Time For Goal Achievement: 06/10/21  Potential to Achieve Goals: Good Progress towards PT goals: Progressing toward goals    Frequency    Min 3X/week      PT Plan Current plan remains appropriate    Co-evaluation              AM-PAC PT "6 Clicks" Mobility   Outcome Measure  Help needed turning from your back to your side while in a flat bed without using bedrails?: A Little Help needed moving from lying on your back  to sitting on the side of a flat bed without using bedrails?: A Little Help needed moving to and from a bed to a chair (including a wheelchair)?: A Little Help needed standing up from a chair using your arms (e.g., wheelchair or bedside chair)?: A Little Help needed to walk in hospital room?: A Little Help needed climbing 3-5 steps with a railing? : A Little 6 Click Score: 18    End of Session Equipment Utilized During Treatment: Gait belt Activity Tolerance: Patient tolerated treatment well;No increased pain Patient left: in chair;with call bell/phone within reach;with chair alarm set Nurse Communication: Mobility status PT Visit Diagnosis: Unsteadiness on feet (R26.81)     Time: 5038-8828 PT Time Calculation (min) (ACUTE ONLY): 11 min  Charges:  $Gait Training: 8-22 mins                     Blondell Reveal Kistler PT 06/05/2021  Acute Rehabilitation Services Pager [phone] Office [phone]

## 2021-06-05 NOTE — Chronic Care Management (AMB) (Signed)
  Care Management   Social Work Visit Note  06/05/2021 Name: Dylan Shaw MRN: [id number] DOB: 1966-02-19  Dylan Shaw is a 55 y.o. year old male who sees Patient, No Pcp Per (Inactive) for primary care. The care management team was consulted for assistance with care management and care coordination needs related to Community Resources     Assessment: Review of patient history, allergies, and health status during evaluation of patient need for care management/care coordination services.    Interventions:  . SW reviewed patients chart after being presenting with documents regarding [employer]. Patient will need to  consent with CCM to receive services. An initial visit will need to be scheduled with SW.   . SW collaborated with Dr. Theodis Shaw to have a referral sent CCM coordinator to schedule patient with SW.  SDOH (Social Determinants of Health) assessments performed: No     Dylan Shaw, Dylan Shaw  Social Worker [facility] Care Management  [phone]

## 2021-06-05 NOTE — Progress Notes (Signed)
   06/05/21 1250  Assess: MEWS Score  Temp 98.1 F (36.7 C)  BP 118/74  Pulse Rate (!) 105  Resp (!) 22  Level of Consciousness Alert  SpO2 98 %  O2 Device Room Air  Assess: MEWS Score  MEWS Temp 0  MEWS Systolic 0  MEWS Pulse 1  MEWS RR 1  MEWS LOC 0  MEWS Score 2  MEWS Score Color Yellow  Assess: if the MEWS score is Yellow or Red  Were vital signs taken at a resting state? Yes  Focused Assessment No change from prior assessment  Does the patient meet 2 or more of the SIRS criteria? Yes  Does the patient have a confirmed or suspected source of infection? No  Provider and Rapid Response Notified? No  MEWS guidelines implemented *See Row Information* No, vital signs rechecked  Treat  MEWS Interventions Escalated (See documentation below);Administered prn meds/treatments  Pain Scale 0-10  Pain Score 0  Complains of Constipation  Take Vital Signs  Increase Vital Sign Frequency  Yellow: Q 2hr X 2 then Q 4hr X 2, if remains yellow, continue Q 4hrs  Escalate  MEWS: Escalate Yellow: discuss with charge nurse/RN and consider discussing with provider and RRT  Notify: Charge Nurse/RN  Name of Charge Nurse/RN Notified Jackie, RN  Date Charge Nurse/RN Notified 06/05/21  Time Charge Nurse/RN Notified 80  Notify: Provider  Provider Name/Title Shelly Coss MD  Date Provider Notified 06/05/21  Time Provider Notified 1315  Notification Type Page  Notification Reason Other (Comment) (SIRS score of 3 and yellow mews)  Provider response No new orders  Date of Provider Response 06/05/21  Time of Provider Response 1407  Document  Patient Outcome Other (Comment) (Patient resting w/o c/o in bed)  Progress note created (see row info) Yes  Assess: SIRS CRITERIA  SIRS Temperature  0  SIRS Pulse 1  SIRS Respirations  1  SIRS WBC 1  SIRS Score Sum  3

## 2021-06-05 NOTE — Progress Notes (Signed)
   06/05/21 1050  Assess: MEWS Score  Temp (!) 97.2 F (36.2 C)  BP 120/81  Pulse Rate (!) 108  Resp (!) 28  SpO2 100 %  O2 Device Room Air  Assess: MEWS Score  MEWS Temp 0  MEWS Systolic 0  MEWS Pulse 1  MEWS RR 2  MEWS LOC 0  MEWS Score 3  MEWS Score Color Yellow  Assess: if the MEWS score is Yellow or Red  Were vital signs taken at a resting state? Yes  Focused Assessment Change from prior assessment (see assessment flowsheet)  Does the patient meet 2 or more of the SIRS criteria? No  Does the patient have a confirmed or suspected source of infection? No  Provider and Rapid Response Notified? No  MEWS guidelines implemented *See Row Information* No, vital signs rechecked  Treat  MEWS Interventions Administered scheduled meds/treatments  Pain Scale 0-10  Pain Score 0  Take Vital Signs  Increase Vital Sign Frequency  Yellow: Q 2hr X 2 then Q 4hr X 2, if remains yellow, continue Q 4hrs  Escalate  MEWS: Escalate Yellow: discuss with charge nurse/RN and consider discussing with provider and RRT  Notify: Charge Nurse/RN  Name of Charge Nurse/RN Notified Landscape architect  Date Charge Nurse/RN Notified 06/05/21  Time Charge Nurse/RN Notified 1100  Notify: Provider  Provider Name/Title Shelly Coss MD  Date Provider Notified 06/05/21  Time Provider Notified 1118  Notification Type Page  Notification Reason Other (Comment) (yellow mews)  Provider response No new orders  Date of Provider Response 06/05/21  Time of Provider Response 1119  Document  Patient Outcome Other (Comment) (No s/s of distress noted)  Progress note created (see row info) Yes  Assess: SIRS CRITERIA  SIRS Temperature  0  SIRS Pulse 1  SIRS Respirations  1  SIRS WBC 0  SIRS Score Sum  2

## 2021-06-05 NOTE — TOC Initial Note (Addendum)
Transition of Care [facility]) - Initial/Assessment Note    Patient Details  Name: Dylan Shaw MRN: [id number] Date of Birth: September 13, 1966  [facility] of [facility]) CM/SW Contact:    Trish Mage, LCSW Phone Number: 06/05/2021, 11:32 AM  Clinical Narrative:    Patient seen in follow up to PT recommendation of SNF.  Dylan Shaw was unable to engage in a meaningful conversation about his dispositional plan.  I called his brother, who confirmed that he is "not giving up on my brother," is wanting full treatment for him and is wanting him to be placed in SNF nearby in [facility], preferably [facility]  I explained the challenges involved, and he voiced understanding.  Dylan Shaw also asked me for help with applying for disability or MCD as someone at [facility] had told him we could do that.  I explained that we do that in cases of no insurance, but he would need to do that himself, and I explained the process.  He pled ignorance with using a computer to go online, and again insisted that [facility] can do this for him.  I messaged financial navigator who confirmed my stance.  I will call him back and tell him he needs to follow up on this.  In meantime, bed search initiated. TOC will continue to follow during the course of hospitalization.  RN CM called from [facility] offering help with d/c planning. Ocean up with Ebony Hail from [facility].  Implored her to advocate for patient to see if they would be willing to bring him in.  She will run it by leadership. However, she did confirm that if they said yes, they could not take him until he is done with radiation.                Expected Discharge Plan: Skilled Nursing Facility Barriers to Discharge: SNF Pending bed offer   Patient Goals and CMS Choice     Choice offered to / list presented to : Sibling  Expected Discharge Plan and Services Expected Discharge Plan: [place]   Discharge Planning Services: CM  Consult Post Acute Care Choice: [place] Living arrangements for the past 2 months: Single Family Home                                      Prior Living Arrangements/Services Living arrangements for the past 2 months: Single Family Home Lives with:: Self Patient language and need for interpreter reviewed:: Yes        Need for Family Participation in Patient Care: Yes (Comment) Care giver support system in place?: Yes (comment)   Criminal Activity/Legal Involvement Pertinent to Current Situation/Hospitalization: No - Comment as needed  Activities of Daily Living Home Assistive Devices/Equipment: None ADL Screening (condition at time of admission) Patient's cognitive ability adequate to safely complete daily activities?: Yes Is the patient deaf or have difficulty hearing?: No Does the patient have difficulty seeing, even when wearing glasses/contacts?: No Does the patient have difficulty concentrating, remembering, or making decisions?: No Patient able to express need for assistance with ADLs?: Yes Does the patient have difficulty dressing or bathing?: No Independently performs ADLs?: Yes (appropriate for developmental age) Does the patient have difficulty walking or climbing stairs?: No Weakness of Legs: None Weakness of Arms/Hands: None  Permission Sought/Granted Permission sought to share information with : Family Supports Permission  granted to share information with : No  Share Information with NAME: Doiron,ROBERT (Brother)   [phone]           Emotional Assessment Appearance:: Appears stated age Attitude/Demeanor/Rapport: Unable to Assess Affect (typically observed): Unable to Assess Orientation: : Oriented to Self Alcohol / Substance Use: Not Applicable Psych Involvement: No (comment)  Admission diagnosis:  Foreign body (FB) in soft tissue [M79.5] Thoracic spine tumor [D49.2] Primary malignant neoplasm of right lung metastatic to other  site [facility]) [C34.91] Compression fracture of body of thoracic vertebra ([place]) [S22.000A] Metastatic cancer to spine [facility]) [C79.51] Patient Active Problem List   Diagnosis Date Noted  . Protein-calorie malnutrition, severe 05/30/2021  . Brain metastasis ([place]) 05/26/2021  . Metastatic cancer to spine ([place]) 05/25/2021  . Thoracic spine tumor 05/24/2021  . Lung cancer ([place]) 05/24/2021  . Protein calorie malnutrition ([place]) 05/24/2021   PCP:  Patient, No Pcp Per (Inactive) Pharmacy:   [facility] [address], [place] [address] Phone: [phone] Fax: [phone]     Social Determinants of Health (SDOH) Interventions    Readmission Risk Interventions No flowsheet data found.

## 2021-06-05 NOTE — NC FL2 (Signed)
[place] LEVEL OF CARE SCREENING TOOL     IDENTIFICATION  Patient Name: Dylan Shaw Birthdate: 08/20/1966 Sex: male Admission Date (Current Location): 05/24/2021  [facility] and [place] Number:  Herbalist and Address:  [facility],  [place] [address], [place]      Provider Number: [id number]  Attending Physician Name and Address:  Shelly Coss, MD  Relative Name and Phone Number:  GLEN, KESINGER (Brother)   [phone]    Current Level of Care: Hospital Recommended Level of Care: [place] Prior Approval Number:    Date Approved/Denied:   PASRR Number: [id number]  Discharge Plan: SNF    Current Diagnoses: Patient Active Problem List   Diagnosis Date Noted  . Protein-calorie malnutrition, severe 05/30/2021  . Brain metastasis ([place]) 05/26/2021  . Metastatic cancer to spine ([place]) 05/25/2021  . Thoracic spine tumor 05/24/2021  . Lung cancer ([place]) 05/24/2021  . Protein calorie malnutrition ([place]) 05/24/2021    Orientation RESPIRATION BLADDER Height & Weight     Self  Normal Incontinent Weight: 46.1 kg Height:  5\' 7"  (170.2 cm)  BEHAVIORAL SYMPTOMS/MOOD NEUROLOGICAL BOWEL NUTRITION STATUS   (none)   Incontinent Diet (see d/c summary)  AMBULATORY STATUS COMMUNICATION OF NEEDS Skin   Extensive Assist Verbally Normal                       Personal Care Assistance Level of Assistance  Bathing,Feeding,Dressing Bathing Assistance: Maximum assistance Feeding assistance: Limited assistance Dressing Assistance: Maximum assistance     Functional Limitations Info  Sight,Hearing,Speech Sight Info: Adequate Hearing Info: Adequate Speech Info: Adequate    SPECIAL CARE FACTORS FREQUENCY  PT (By licensed PT),OT (By licensed OT)     PT Frequency: 5X/W OT Frequency: 5X/W            Contractures Contractures Info: Not present    Additional Factors Info  Code Status,Allergies Code Status Info:  DNR Allergies Info: NKA           Current Medications (06/05/2021):  This is the current hospital active medication list Current Facility-Administered Medications  Medication Dose Route Frequency Provider Last Rate Last Admin  . (feeding supplement) PROSource Plus liquid 30 mL  30 mL Oral BID BM Hongalgi, Anand D, MD   30 mL at 06/05/21 1121  . acetaminophen (TYLENOL) tablet 650 mg  650 mg Oral Q4H PRN Modena Jansky, MD   650 mg at 06/03/21 1356  . alum & mag hydroxide-simeth (MAALOX/MYLANTA) 200-200-20 MG/5ML suspension 30 mL  30 mL Oral Q6H PRN Hongalgi, Anand D, MD      . bisacodyl (DULCOLAX) suppository 10 mg  10 mg Rectal Daily PRN Hongalgi, Anand D, MD      . dexamethasone (DECADRON) injection 4 mg  4 mg Intravenous Q6H Hongalgi, Anand D, MD   4 mg at 06/04/21 1259   Or  . dexamethasone (DECADRON) tablet 4 mg  4 mg Oral Q6H Hongalgi, Lenis Dickinson, MD   4 mg at 06/05/21 1122  . docusate sodium (COLACE) capsule 100 mg  100 mg Oral BID Modena Jansky, MD   100 mg at 06/05/21 1122  . feeding supplement (ENSURE ENLIVE / ENSURE PLUS) liquid 237 mL  237 mL Oral TID BM Hongalgi, Anand D, MD   237 mL at 06/05/21 1122  . heparin injection 5,000 Units  5,000 Units Subcutaneous Q8H Modena Jansky, MD   5,000 Units at 06/05/21 0603  . levETIRAcetam (KEPPRA)  tablet 500 mg  500 mg Oral BID Modena Jansky, MD   500 mg at 06/05/21 1122  . LORazepam (ATIVAN) injection 1 mg  1 mg Intravenous Q6H PRN Modena Jansky, MD   1 mg at 06/04/21 1054  . methocarbamol (ROBAXIN) tablet 500 mg  500 mg Oral Q6H PRN Modena Jansky, MD   500 mg at 06/03/21 1356   Or  . methocarbamol (ROBAXIN) 500 mg in dextrose 5 % 50 mL IVPB  500 mg Intravenous Q6H PRN Hongalgi, Anand D, MD      . ondansetron (ZOFRAN) tablet 4 mg  4 mg Oral Q6H PRN Hongalgi, Lenis Dickinson, MD       Or  . ondansetron (ZOFRAN) injection 4 mg  4 mg Intravenous Q6H PRN Hongalgi, Anand D, MD      . pantoprazole (PROTONIX) EC tablet 40 mg  40 mg  Oral QHS Modena Jansky, MD   40 mg at 06/04/21 2106  . polyethylene glycol (MIRALAX / GLYCOLAX) packet 17 g  17 g Oral Daily PRN Modena Jansky, MD   17 g at 05/28/21 1848  . sodium chloride flush (NS) 0.9 % injection 3 mL  3 mL Intravenous Q12H Hongalgi, Lenis Dickinson, MD   3 mL at 06/04/21 1014  . sodium chloride flush (NS) 0.9 % injection 3 mL  3 mL Intravenous PRN Hongalgi, Anand D, MD      . sodium phosphate (FLEET) 7-19 GM/118ML enema 1 enema  1 enema Rectal Once PRN Hongalgi, Lenis Dickinson, MD         Discharge Medications: Please see discharge summary for a list of discharge medications.  Relevant Imaging Results:  Relevant Lab Results:   Additional Information [place], [place]

## 2021-06-06 ENCOUNTER — Ambulatory Visit
Admit: 2021-06-06 | Discharge: 2021-06-06 | Disposition: A | Discharge status: Home / Self Care | Payer: 59 | Attending: Radiation Oncology | Admitting: Radiation Oncology

## 2021-06-06 LAB — CBC WITH DIFFERENTIAL/PLATELET
Abs Immature Granulocytes: 0.18 10*3/uL — ABNORMAL HIGH (ref 0.00–0.07)
Basophils Absolute: 0 10*3/uL (ref 0.0–0.1)
Basophils Relative: 0 %
Eosinophils Absolute: 0 10*3/uL (ref 0.0–0.5)
Eosinophils Relative: 0 %
HCT: 34.1 % — ABNORMAL LOW (ref 39.0–52.0)
Hemoglobin: 11 g/dL — ABNORMAL LOW (ref 13.0–17.0)
Immature Granulocytes: 1 %
Lymphocytes Relative: 9 %
Lymphs Abs: 1.5 10*3/uL (ref 0.7–4.0)
MCH: 28.6 pg (ref 26.0–34.0)
MCHC: 32.3 g/dL (ref 30.0–36.0)
MCV: 88.6 fL (ref 80.0–100.0)
Monocytes Absolute: 1 10*3/uL (ref 0.1–1.0)
Monocytes Relative: 6 %
Neutro Abs: 14.5 10*3/uL — ABNORMAL HIGH (ref 1.7–7.7)
Neutrophils Relative %: 84 %
Platelets: 449 10*3/uL — ABNORMAL HIGH (ref 150–400)
RBC: 3.85 MIL/uL — ABNORMAL LOW (ref 4.22–5.81)
RDW: 15.3 % (ref 11.5–15.5)
WBC: 17.2 10*3/uL — ABNORMAL HIGH (ref 4.0–10.5)
nRBC: 0 % (ref 0.0–0.2)

## 2021-06-06 LAB — GLUCOSE, CAPILLARY
Glucose-Capillary: 89 mg/dL (ref 70–99)
Glucose-Capillary: 132 mg/dL — ABNORMAL HIGH (ref 70–99)
Glucose-Capillary: 112 mg/dL — ABNORMAL HIGH (ref 70–99)

## 2021-06-06 MED ORDER — KETOROLAC TROMETHAMINE 15 MG/ML IJ SOLN: 15.0000 mg | Freq: Once | INTRAMUSCULAR | Status: AC

## 2021-06-06 NOTE — Progress Notes (Signed)
PROGRESS NOTE    Dylan Shaw  LTJ:[id number] DOB: 10/17/1966 DOA: 05/24/2021 PCP: Patient, No Pcp Per (Inactive)   Brief Narrative:   Patient is a 55 year old male with history of tobacco use who presented initially with complaints of 50 pound weight loss, fatigue, low back pain.  He was found to have evidence of lung and spine mass concerning for metastatic cancer.  Started on Decadron.  During this hospitalization he developed urinary and bowel incontinence leading to neurosurgical management and surgical decompression of the spine.  Oncology/radiation oncology consulted.  Surgical pathology showed poorly differentiated  Lung adenocarcinoma .  Hospital course complicated by acute encephalopathy/intermittent agitation requiring restraints.  Oncology team gave options for hospice versus palliative whole brain radiation therapy.  Family interested to proceed with whole brain radiation treatment/palliative chemotherapy.  Patient transferred to [facility] for whole brain radiation on 06/04/2021.  Patient was seen by PT and OT and recommended skilled nursing facility discharge.  He is medically stable for discharge to skilled nursing facility as soon as bed is available if he can be transported for radiation therapy here from skilled nursing facility. No changes ON.    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       - Patient underwent vertebrectomy/laminectomy with resection of mass on 5/28 by neurosurgery.       - Surgical pathology showed poorly differentiated carcinoma with lung as the most likely primary.       - MRI confirmed metastatic brain lesions with extra edema, 11 mm rightward midline shift.     - Neurosurgery recommended to continue Decadron and Keppra.     - Palliative  care was also consulted during this hospitalization.  Patient is currently DNR.     - After   discussion, patient and family were interested on seeking cancer treatment.       - Decision was made to start whole brain radiation treatment and was transferred to [facility].       - Patient will follow up with oncology team for palliative chemotherapy as an outpatient at [facility].     - no acute events ON. No changes for today   Altered mental status Metabolic encephalopathy secondary to brain mets, mass-effect, steroid use, seizure with postictal phase.      - He was on soft restraints; now discontinued.       - Continue monitoring mental status.       - Delirium precautions.       - Minimize opioids/sedatives.       - Patient does not have capacity to make decisions so cannot leave AMA.       - Brother makes the decision for the patient.       - Avoiding Haldol for the concern of seizure/reducing seizure threshold   Leukocytosis      - No signs of infection.       - Most likely associated with Decadron.   Aphasia/focal seizure     - Code stroke was called on 5/29 with negative CT/MRI for stroke.       - EEG did not show any seizure activity.       - Neurology was also consulted.       - Currently on Keppra; continue   Severe protein calorie moderation     - Nutrition following.       -  Has bitemporal wasting.       - BMI 15.9   Tobacco use     - Smoking cessation was counseled   Normocytic anemia     - Most likely associated with malignancy/chronic disease.     - no evidence of bleed, follow   Debility/deconditioning     - PT/OT recommending skilled nursing facility on discharge.  TOC consulted.  DVT prophylaxis: heparin Code Status: DNR Family Communication: None at bedside   Status is: Inpatient  Remains inpatient appropriate because:Unsafe d/c plan  Dispo: The patient is from: Home              Anticipated d/c is to: SNF              Patient currently is medically stable to d/c.   Difficult to place patient Yes   Consultants:  Neurosurgery Med [place] [facility]  Procedures:  XRT  Antimicrobials:  None currently   Subjective: "Yeah, there's a whole lot in my chart"  Objective: Vitals:   06/05/21 1900 06/05/21 2004 06/05/21 2227 06/06/21 0629  BP: (!) 133/93 115/84 110/78 123/86  Pulse: 93 90 91 91  Resp: (!) 22 18 18 17   Temp: 97.7 F (36.5 C) 97.7 F (36.5 C) 97.9 F (36.6 C) 98.3 F (36.8 C)  TempSrc: Oral Oral  Oral  SpO2: 100% 100% 100% 98%  Weight:      Height:        Intake/Output Summary (Last 24 hours) at 06/06/2021 0900 Last data filed at 06/05/2021 2200 Gross per 24 hour  Intake 680 ml  Output 450 ml  Net 230 ml   Filed Weights   05/24/21 2208 05/25/21 0601 05/25/21 1737  Weight: 46.1 kg 46.1 kg 46.1 kg    Examination:  General: 55 y.o. male resting in chair in NAD Cardiovascular: RRR, +S1, S2, no m/g/r, equal pulses throughout Respiratory: CTABL, no w/r/r, normal WOB GI: BS+, NDNT, no masses noted, no organomegaly noted MSK: No e/c/c Neuro: A&O x 3, no focal deficits Psyc: Appropriate interaction and affect, calm/cooperative   Data Reviewed: I have personally reviewed following labs and imaging studies.  CBC: Recent Labs  Lab 06/05/21 0532 06/06/21 0539  WBC 17.5* 17.2*  NEUTROABS 14.9* 14.5*  HGB 11.1* 11.0*  HCT 34.4* 34.1*  MCV 89.1 88.6  PLT 461* 193*   Basic Metabolic Panel: Recent Labs  Lab 06/05/21 0532  NA 135  K 4.5  CL 100  CO2 25  GLUCOSE 101*  BUN 25*  CREATININE 0.55*  CALCIUM 9.2   GFR: Estimated Creatinine Clearance: 69.6 mL/min (A) (by C-G formula based on SCr of 0.55 mg/dL (L)). Liver Function Tests: No results for input(s): AST, ALT, ALKPHOS, BILITOT, PROT, ALBUMIN in the last 168 hours. No results for input(s): LIPASE, AMYLASE in the last 168 hours. No results for input(s): AMMONIA in the last 168 hours. Coagulation Profile: No results for input(s): INR, PROTIME in the last 168 hours. Cardiac Enzymes: No results for input(s): CKTOTAL, CKMB,  CKMBINDEX, TROPONINI in the last 168 hours. BNP (last 3 results) No results for input(s): PROBNP in the last 8760 hours. HbA1C: No results for input(s): HGBA1C in the last 72 hours. CBG: Recent Labs  Lab 06/05/21 0615 06/05/21 1045 06/05/21 1657 06/05/21 2125 06/06/21 0737  GLUCAP 104* 90 96 93 89   Lipid Profile: No results for input(s): CHOL, HDL, LDLCALC, TRIG, CHOLHDL, LDLDIRECT in the last 72 hours. Thyroid Function Tests: No results for input(s): TSH,  T4TOTAL, FREET4, T3FREE, THYROIDAB in the last 72 hours. Anemia Panel: No results for input(s): VITAMINB12, FOLATE, FERRITIN, TIBC, IRON, RETICCTPCT in the last 72 hours. Sepsis Labs: No results for input(s): PROCALCITON, LATICACIDVEN in the last 168 hours.  No results found for this or any previous visit (from the past 240 hour(s)).    Radiology Studies: No results found.   Scheduled Meds:  (feeding supplement) PROSource Plus  30 mL Oral BID BM   dexamethasone  4 mg Oral Q12H   docusate sodium  100 mg Oral BID   feeding supplement  237 mL Oral TID BM   heparin  5,000 Units Subcutaneous Q8H   levETIRAcetam  500 mg Oral BID   pantoprazole  40 mg Oral QHS   sodium chloride flush  3 mL Intravenous Q12H   Continuous Infusions:  methocarbamol (ROBAXIN) IV       LOS: 13 days    Time spent: 20 minutes   Jonnie Finner, DO [facility]  If 7PM-7AM, please contact night-coverage [email or web address] 06/06/2021, 9:00 AM

## 2021-06-07 ENCOUNTER — Ambulatory Visit
Admit: 2021-06-07 | Discharge: 2021-06-07 | Disposition: A | Discharge status: Home / Self Care | Payer: 59 | Attending: Radiation Oncology | Admitting: Radiation Oncology

## 2021-06-07 LAB — CBC WITH DIFFERENTIAL/PLATELET
Abs Immature Granulocytes: 0.39 10*3/uL — ABNORMAL HIGH (ref 0.00–0.07)
Basophils Absolute: 0 10*3/uL (ref 0.0–0.1)
Basophils Relative: 0 %
Eosinophils Absolute: 0 10*3/uL (ref 0.0–0.5)
Eosinophils Relative: 0 %
HCT: 34.9 % — ABNORMAL LOW (ref 39.0–52.0)
Hemoglobin: 10.8 g/dL — ABNORMAL LOW (ref 13.0–17.0)
Immature Granulocytes: 2 %
Lymphocytes Relative: 6 %
Lymphs Abs: 1.1 10*3/uL (ref 0.7–4.0)
MCH: 28.1 pg (ref 26.0–34.0)
MCHC: 30.9 g/dL (ref 30.0–36.0)
MCV: 90.9 fL (ref 80.0–100.0)
Monocytes Absolute: 1 10*3/uL (ref 0.1–1.0)
Monocytes Relative: 6 %
Neutro Abs: 14.8 10*3/uL — ABNORMAL HIGH (ref 1.7–7.7)
Neutrophils Relative %: 86 %
Platelets: 494 10*3/uL — ABNORMAL HIGH (ref 150–400)
RBC: 3.84 MIL/uL — ABNORMAL LOW (ref 4.22–5.81)
RDW: 15.7 % — ABNORMAL HIGH (ref 11.5–15.5)
WBC: 17.2 10*3/uL — ABNORMAL HIGH (ref 4.0–10.5)
nRBC: 0 % (ref 0.0–0.2)

## 2021-06-07 LAB — GLUCOSE, CAPILLARY
Glucose-Capillary: 92 mg/dL (ref 70–99)
Glucose-Capillary: 222 mg/dL — ABNORMAL HIGH (ref 70–99)
Glucose-Capillary: 94 mg/dL (ref 70–99)

## 2021-06-07 LAB — COMPREHENSIVE METABOLIC PANEL
ALT: 33 U/L (ref 0–44)
AST: 17 U/L (ref 15–41)
Albumin: 2.8 g/dL — ABNORMAL LOW (ref 3.5–5.0)
Alkaline Phosphatase: 109 U/L (ref 38–126)
Anion gap: 9 (ref 5–15)
BUN: 26 mg/dL — ABNORMAL HIGH (ref 6–20)
CO2: 28 mmol/L (ref 22–32)
Calcium: 8.9 mg/dL (ref 8.9–10.3)
Chloride: 98 mmol/L (ref 98–111)
Creatinine, Ser: 0.53 mg/dL — ABNORMAL LOW (ref 0.61–1.24)
GFR, Estimated: >60 mL/min (ref >60)
Glucose, Bld: 112 mg/dL — ABNORMAL HIGH (ref 70–99)
Potassium: 4.2 mmol/L (ref 3.5–5.1)
Sodium: 135 mmol/L (ref 135–145)
Total Bilirubin: 0.5 mg/dL (ref 0.3–1.2)
Total Protein: 6.9 g/dL (ref 6.5–8.1)

## 2021-06-07 LAB — MAGNESIUM: Magnesium: 2 mg/dL (ref 1.7–2.4)

## 2021-06-07 NOTE — Progress Notes (Addendum)
Patient increasingly agitated and anxious. He keeps getting up from bed to the chair and back to bed. Bed alarm and chair alarm are on. Patient was educated about not getting up without assistance. Cognitive barrier and impulsive. Floor mats are in place. PRN for anxiety was given 1520. Patient was toileted and dinner tray was set up. RN sitting close to patient room for safety.

## 2021-06-07 NOTE — Progress Notes (Signed)
PROGRESS NOTE    Dylan Shaw  IEP:[id number] DOB: [birth year] DOA: 05/24/2021 PCP: Patient, No Pcp Per (Inactive)   Brief Narrative:   Patient is a 55 year old male with history of tobacco use who presented initially with complaints of 50 pound weight loss, fatigue, low back pain.  He was found to have evidence of lung and spine mass concerning for metastatic cancer.  Started on Decadron.  During this hospitalization he developed urinary and bowel incontinence leading to neurosurgical management and surgical decompression of the spine.  Oncology/radiation oncology consulted.  Surgical pathology showed poorly differentiated  Lung adenocarcinoma .  Hospital course complicated by acute encephalopathy/intermittent agitation requiring restraints.  Oncology team gave options for hospice versus palliative whole brain radiation therapy.  Family interested to proceed with whole brain radiation treatment/palliative chemotherapy.  Patient transferred to [facility] for whole brain radiation on 06/04/2021.  Patient was seen by PT and OT and recommended skilled nursing facility discharge.  He is medically stable for discharge to skilled nursing facility as soon as bed is available if he can be transported for radiation therapy here from skilled nursing facility.   06/07/2021: Patient seen.  Available records reviewed.  No new complaints today.  Patient tells me that he does not have counseling!!!!   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       - Patient underwent vertebrectomy/laminectomy with resection of mass on 5/28 by neurosurgery.       - Surgical pathology showed poorly differentiated carcinoma with lung as the most likely primary.       - MRI confirmed metastatic brain lesions with extra edema, 11 mm rightward midline shift.     - Neurosurgery recommended to continue Decadron  and Keppra.     - Palliative  care was also consulted during this hospitalization.  Patient is currently DNR.     - After  discussion, patient and family were interested on seeking cancer treatment.       - Decision was made to start whole brain radiation treatment and was transferred to [facility].       - Patient will follow up with oncology team for palliative chemotherapy as an outpatient at [facility].     - no acute events  . No changes for today   Altered mental status Metabolic encephalopathy secondary to brain mets, mass-effect, steroid use, seizure with postictal phase.      - He was on soft restraints; now discontinued.       - Continue monitoring mental status.       - Delirium precautions.       - Minimize opioids/sedatives.       - Patient does not have capacity to make decisions so cannot leave AMA.       - Brother makes the decision for the patient.       - Avoiding Haldol for the concern of seizure/reducing seizure threshold 06/07/2021: Patient is awake and alert.  Moves all EXTR.   Leukocytosis      - No signs of infection.       - Most likely associated with Decadron.   Aphasia/focal seizure     - Code stroke was called on 5/29 with negative CT/MRI for stroke.       - EEG did not show any seizure activity.       - Neurology was also consulted.       -  Currently on Keppra; continue   Severe protein calorie moderation     - Nutrition following.       - Has bitemporal wasting.       - BMI 15.9   Tobacco use     - Smoking cessation was counseled   Normocytic anemia     - Most likely associated with malignancy/chronic disease.     - no evidence of bleed, follow   Debility/deconditioning     - PT/OT recommending skilled nursing facility on discharge.  TOC consulted.  DVT prophylaxis: heparin Code Status: DNR Family Communication: None at bedside   Status is: Inpatient  Remains inpatient appropriate because:Unsafe d/c plan  Dispo: The patient is from: Home               Anticipated d/c is to: SNF              Patient currently is medically stable to d/c.   Difficult to place patient Yes   Consultants:  Neurosurgery Med [place] [facility]  Procedures:  XRT  Antimicrobials:  None currently   Subjective: -No complaints. -Patient moves all extremities.  Objective: Vitals:   06/06/21 0629 06/06/21 1741 06/06/21 2009 06/07/21 0332  BP: 123/86 121/78 125/82 109/84  Pulse: 91 78 (!) 104 95  Resp: 17 16 15 15   Temp: 98.3 F (36.8 C) 98.4 F (36.9 C) 98.7 F (37.1 C) 98.3 F (36.8 C)  TempSrc: Oral Oral Oral Oral  SpO2: 98% 98% 100% 100%  Weight:      Height:        Intake/Output Summary (Last 24 hours) at 06/07/2021 1030 Last data filed at 06/07/2021 0900 Gross per 24 hour  Intake 1420 ml  Output 300 ml  Net 1120 ml    Filed Weights   05/24/21 2208 05/25/21 0601 05/25/21 1737  Weight: 46.1 kg 46.1 kg 46.1 kg    Examination:  General: Patient is cachectic.  Patient is not in any distress.  Patient is awake and alert.   Cardiovascular: S1-S2 Respiratory: Clear to auscultation. GI: Soft and non-tender. Neuro: A&O x 3, no focal deficits Extremities: No leg edema.   Data Reviewed: I have personally reviewed following labs and imaging studies.  CBC: Recent Labs  Lab 06/05/21 0532 06/06/21 0539 06/07/21 0529  WBC 17.5* 17.2* 17.2*  NEUTROABS 14.9* 14.5* 14.8*  HGB 11.1* 11.0* 10.8*  HCT 34.4* 34.1* 34.9*  MCV 89.1 88.6 90.9  PLT 461* 449* 494*    Basic Metabolic Panel: Recent Labs  Lab 06/05/21 0532 06/07/21 0529  NA 135 135  K 4.5 4.2  CL 100 98  CO2 25 28  GLUCOSE 101* 112*  BUN 25* 26*  CREATININE 0.55* 0.53*  CALCIUM 9.2 8.9  MG  --  2.0    GFR: Estimated Creatinine Clearance: 69.6 mL/min (A) (by C-G formula based on SCr of 0.53 mg/dL (L)). Liver Function Tests: Recent Labs  Lab 06/07/21 0529  AST 17  ALT 33  ALKPHOS 109  BILITOT 0.5  PROT 6.9  ALBUMIN 2.8*   No results for  input(s): LIPASE, AMYLASE in the last 168 hours. No results for input(s): AMMONIA in the last 168 hours. Coagulation Profile: No results for input(s): INR, PROTIME in the last 168 hours. Cardiac Enzymes: No results for input(s): CKTOTAL, CKMB, CKMBINDEX, TROPONINI in the last 168 hours. BNP (last 3 results) No results for input(s): PROBNP in the last 8760 hours. HbA1C: No results for input(s): HGBA1C in the last 72  hours. CBG: Recent Labs  Lab 06/05/21 2125 06/06/21 0737 06/06/21 1116 06/06/21 1709 06/07/21 0720  GLUCAP 93 89 132* 112* 92    Lipid Profile: No results for input(s): CHOL, HDL, LDLCALC, TRIG, CHOLHDL, LDLDIRECT in the last 72 hours. Thyroid Function Tests: No results for input(s): TSH, T4TOTAL, FREET4, T3FREE, THYROIDAB in the last 72 hours. Anemia Panel: No results for input(s): VITAMINB12, FOLATE, FERRITIN, TIBC, IRON, RETICCTPCT in the last 72 hours. Sepsis Labs: No results for input(s): PROCALCITON, LATICACIDVEN in the last 168 hours.  No results found for this or any previous visit (from the past 240 hour(s)).    Radiology Studies: No results found.   Scheduled Meds:  (feeding supplement) PROSource Plus  30 mL Oral BID BM   dexamethasone  4 mg Oral Q12H   docusate sodium  100 mg Oral BID   feeding supplement  237 mL Oral TID BM   heparin  5,000 Units Subcutaneous Q8H   ketorolac  15 mg Intravenous Once   levETIRAcetam  500 mg Oral BID   pantoprazole  40 mg Oral QHS   sodium chloride flush  3 mL Intravenous Q12H   Continuous Infusions:  methocarbamol (ROBAXIN) IV       LOS: 14 days    Time spent: 25 minutes   Bonnell Public, MD [facility]  If 7PM-7AM, please contact night-coverage [email or web address] 06/07/2021, 10:30 AM

## 2021-06-07 NOTE — Progress Notes (Signed)
[place] visited pt. per [facility] consult from palliative care team to assess whether pt. is alert/oriented enough to have his advanced directive signed and notarized.  5E staff say pt. is not appropriate for signing legal documents today; [place] visited briefly w/pt. and confirmed that he seemed slightly confused, but he remembers completing AD and requests help getting it finalized.  Unsigned AD is in pocket of pt.'s paper chart behind RN station.  [facility] will follow pt. and assess for appropriateness to sign if possible next week.   Lindaann Pascal PRN Chaplain Pager: [phone]

## 2021-06-07 NOTE — Progress Notes (Signed)
Physical Therapy Treatment Patient Details Name: Dylan Shaw MRN: [id number] DOB: 08-09-1966 Today's Date: 06/07/2021    History of Present Illness 55 y.o. man w/ new metastatic disease s/p thoracolumbar decompression and fusion on 05/25/21. Now w/ expressive aphasia 5/29 , self-limited, likely focal seizure. 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 smoker, 50 pound weight loss    PT Comments    Assisted OOB to amb in hallway was difficult.  General Gait Details: attempted gait without walker was difficult.  Poor balance and instability.  "Furniture" walking in room reaching beyond BOS.  Unsafe.  Used walker for in hallway.  Ataxic, lateral sway, needing assist to navigate around furniture/doorway.  Slight improvement with increased distance.  Unsteady. Assisted to recliner and set up lunch tray. Pt lives with his brother but will need ST Rehab at [facility] for a safe D/C.   Follow Up Recommendations  SNF     Equipment Recommendations  Rolling walker with 5" wheels;3in1 (PT)    Recommendations for Other Services       Precautions / Restrictions Precautions Precautions: Back    Mobility  Bed Mobility Overal bed mobility: Needs Assistance Bed Mobility: Supine to Sit   Sidelying to sit: Supervision;HOB elevated       General bed mobility comments: self able with increased time and use of rails    Transfers Overall transfer level: Needs assistance Equipment used: Rolling walker (2 wheeled) Transfers: Sit to/from Stand Sit to Stand: Supervision;Min guard         General transfer comment: impulsive with use of hands to support/steady self  Ambulation/Gait Ambulation/Gait assistance: Min guard Gait Distance (Feet): 175 Feet Assistive device: Rolling walker (2 wheeled) Gait Pattern/deviations: Step-through pattern;Scissoring;Drifts right/left;Wide base of support Gait velocity: decreased   General Gait Details: attempted gait without walker was  difficult.  Poor balance and instability.  "Furniture" walking in room reaching beyond BOS.  Unsafe.  Used walker for in hallway.  Ataxic, lateral sway, needing assist to navigate around furniture/doorway.  Slight improvement with increased distance.  Unsteady.   Stairs             Wheelchair Mobility    Modified Rankin (Stroke Patients Only)       Balance                                            Cognition Arousal/Alertness: Awake/alert Behavior During Therapy: Restless;Impulsive Overall Cognitive Status: Within Functional Limits for tasks assessed                                 General Comments: AxO x 3 impulsive but cooperative      Exercises      General Comments        Pertinent Vitals/Pain Pain Assessment: Faces Faces Pain Scale: Hurts little more Pain Location: back Pain Descriptors / Indicators: Aching;Discomfort;Grimacing Pain Intervention(s): Monitored during session;Premedicated before session;Repositioned    Home Living                      Prior Function            PT Goals (current goals can now be found in the care plan section) Progress towards PT goals: Progressing toward goals    Frequency    Min  3X/week      PT Plan Current plan remains appropriate    Co-evaluation              AM-PAC PT "6 Clicks" Mobility   Outcome Measure  Help needed turning from your back to your side while in a flat bed without using bedrails?: A Little Help needed moving from lying on your back to sitting on the side of a flat bed without using bedrails?: A Little Help needed moving to and from a bed to a chair (including a wheelchair)?: A Little Help needed standing up from a chair using your arms (e.g., wheelchair or bedside chair)?: A Lot Help needed to walk in hospital room?: A Lot Help needed climbing 3-5 steps with a railing? : Total 6 Click Score: 14    End of Session Equipment Utilized  During Treatment: Gait belt Activity Tolerance: Patient tolerated treatment well;No increased pain Patient left: in chair;with call bell/phone within reach;with chair alarm set Nurse Communication: Mobility status PT Visit Diagnosis: Unsteadiness on feet (R26.81)     Time: 1145-1200 PT Time Calculation (min) (ACUTE ONLY): 15 min  Charges:  $Gait Training: 8-22 mins

## 2021-06-08 LAB — GLUCOSE, CAPILLARY
Glucose-Capillary: 76 mg/dL (ref 70–99)
Glucose-Capillary: 152 mg/dL — ABNORMAL HIGH (ref 70–99)
Glucose-Capillary: 106 mg/dL — ABNORMAL HIGH (ref 70–99)

## 2021-06-08 NOTE — Progress Notes (Signed)
Occupational Therapy Treatment Patient Details Name: Dylan Shaw MRN: [id number] DOB: Mar 29, 1966 Today's Date: 06/08/2021    History of present illness 55 y.o. man w/ new metastatic disease s/p thoracolumbar decompression and fusion on 05/25/21. Now w/ expressive aphasia 5/29 , self-limited, likely focal seizure. 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 smoker, 50 pound weight loss   OT comments  Pt in bed upon arrival with sitter present.Pt agreeable to functional mobility to bathroom with RW. Session focused on bed mobility to sit EOB, sit - stand with RW, functional mobility safety using RW to bathroom, toilet transfers, clothing mgt, hygiene at sink and back to supine in bed. Pt politely declined sitting up in recliner for lunch or walking to doorway. Pt limited by fatigue, required increased time to complete tasks. OT will continue to follow acutely to maximize level of function and safety  Follow Up Recommendations  SNF    Equipment Recommendations  Other (comment);3 in 1 bedside commode (TBD at next venue of care)    Recommendations for Other Services      Precautions / Restrictions Precautions Precautions: Back Precaution Comments: reviewed back precautions Restrictions Weight Bearing Restrictions: No       Mobility Bed Mobility Overal bed mobility: Needs Assistance Bed Mobility: Supine to Sit;Sit to Supine Rolling: Supervision Sidelying to sit: Supervision;HOB elevated Supine to sit: Min assist     General bed mobility comments: min A to elevate truink, increased time and effort. Pt able to return to supine with no physical assist    Transfers Overall transfer level: Needs assistance Equipment used: Rolling walker (2 wheeled) Transfers: Sit to/from Stand Sit to Stand: Min guard         General transfer comment: impulsive, used RW    Balance Overall balance assessment: Needs assistance Sitting-balance support: Feet supported;No upper  extremity supported Sitting balance-Leahy Scale: Good Sitting balance - Comments: Impulsive and needs supervision.   Standing balance support: Bilateral upper extremity supported;Single extremity supported;During functional activity Standing balance-Leahy Scale: Fair                             ADL either performed or assessed with clinical judgement   ADL       Grooming: Supervision/safety;Wash/dry hands;Wash/dry face;Moderate assistance;Min guard;Standing                   Toilet Transfer: Min guard;Supervision/safety;Ambulation;RW;Comfort height toilet;Grab bars;Cueing for safety   Toileting- Clothing Manipulation and Hygiene: Sit to/from stand;Cueing for sequencing;Cueing for safety;Minimal assistance       Functional mobility during ADLs: Min guard;Supervision/safety;Cueing for safety;Cueing for sequencing       Vision Patient Visual Report: No change from baseline     Perception     Praxis      Cognition Arousal/Alertness: Awake/alert Behavior During Therapy: Impulsive;Flat affect Overall Cognitive Status: Within Functional Limits for tasks assessed Area of Impairment: Memory;Safety/judgement;Awareness                     Memory: Decreased short-term memory;Decreased recall of precautions         General Comments: pt cooperative        Exercises     Shoulder Instructions       General Comments      Pertinent Vitals/ Pain       Pain Assessment: Faces Faces Pain Scale: Hurts little more Pain Location: back Pain Descriptors / Indicators: Aching;Discomfort;Grimacing  Pain Intervention(s): Monitored during session;Premedicated before session;Repositioned  Home Living                                          Prior Functioning/Environment              Frequency  Min 2X/week        Progress Toward Goals  OT Goals(current goals can now be found in the care plan section)  Progress towards OT  goals: Progressing toward goals     Plan Discharge plan remains appropriate    Co-evaluation                 AM-PAC OT "6 Clicks" Daily Activity     Outcome Measure   Help from another person eating meals?: A Little Help from another person taking care of personal grooming?: A Little Help from another person toileting, which includes using toliet, bedpan, or urinal?: A Little Help from another person bathing (including washing, rinsing, drying)?: A Little Help from another person to put on and taking off regular upper body clothing?: A Little Help from another person to put on and taking off regular lower body clothing?: A Little 6 Click Score: 18    End of Session Equipment Utilized During Treatment: Gait belt;Rolling walker  OT Visit Diagnosis: Unsteadiness on feet (R26.81);Muscle weakness (generalized) (M62.81);Cognitive communication deficit (R41.841)   Activity Tolerance Patient limited by fatigue   Patient Left with call bell/phone within reach;in bed;with bed alarm set;with nursing/sitter in room   Nurse Communication          Time: 2876-8115 OT Time Calculation (min): 17 min  Charges: OT General Charges $OT Visit: 1 Visit OT Treatments $Self Care/Home Management : 8-22 mins    Britt Bottom 06/08/2021, 2:13 PM

## 2021-06-08 NOTE — Progress Notes (Signed)
PROGRESS NOTE    Dylan Shaw  IRW:[id number] DOB: Jul 02, 1966 DOA: 05/24/2021 PCP: Patient, No Pcp Per (Inactive)   Brief Narrative:   Patient is a 54 year old male with history of tobacco use who presented initially with complaints of 50 pound weight loss, fatigue, low back pain.  He was found to have evidence of lung and spine mass concerning for metastatic cancer.  Started on Decadron.  During this hospitalization he developed urinary and bowel incontinence leading to neurosurgical management and surgical decompression of the spine.  Oncology/radiation oncology consulted.  Surgical pathology showed poorly differentiated  Lung adenocarcinoma .  Hospital course complicated by acute encephalopathy/intermittent agitation requiring restraints.  Oncology team gave options for hospice versus palliative whole brain radiation therapy.  Family interested to proceed with whole brain radiation treatment/palliative chemotherapy.  Patient transferred to [facility] for whole brain radiation on 06/04/2021.  Patient was seen by PT and OT and recommended skilled nursing facility discharge.  He is medically stable for discharge to skilled nursing facility as soon as bed is available if he can be transported for radiation therapy here from skilled nursing facility.   06/07/2021: Patient seen.  Available records reviewed.  No new complaints today.  Patient tells me that he does not have cancer. 06/08/2021: Patient seen.  Patient reports back pain.  Will apply lidocaine patch to the back area.  We will continue the pain medications.   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       - Patient underwent vertebrectomy/laminectomy with resection of mass on 5/28 by neurosurgery.       - Surgical pathology showed poorly differentiated carcinoma with lung as the most likely primary.       - MRI  confirmed metastatic brain lesions with extra edema, 11 mm rightward midline shift.     - Neurosurgery recommended to continue Decadron and Keppra.     - Palliative  care was also consulted during this hospitalization.  Patient is currently DNR.     - After  discussion, patient and family were interested on seeking cancer treatment.       - Decision was made to start whole brain radiation treatment and was transferred to [facility].       - Patient will follow up with oncology team for palliative chemotherapy as an outpatient at [facility].     - no acute events  . No changes for today   Altered mental status Metabolic encephalopathy secondary to brain mets, mass-effect, steroid use, seizure with postictal phase.      - He was on soft restraints; now discontinued.       - Continue monitoring mental status.       - Delirium precautions.       - Minimize opioids/sedatives.       - Patient does not have capacity to make decisions so cannot leave AMA.       - Brother makes the decision for the patient.       - Avoiding Haldol for the concern of seizure/reducing seizure threshold 06/07/2021: Patient is awake and alert.  Moves all extremities.     Leukocytosis      - No signs of infection.       - Most likely associated with Decadron.   Aphasia/focal seizure     - Code stroke was called on 5/29 with negative CT/MRI for  stroke.       - EEG did not show any seizure activity.       - Neurology was also consulted.       - Currently on Keppra; continue   Severe protein calorie moderation     - Nutrition following.       - Has bitemporal wasting.       - BMI 15.9   Tobacco use     - Smoking cessation was counseled   Normocytic anemia     - Most likely associated with malignancy/chronic disease.     - no evidence of bleed, follow   Debility/deconditioning     - PT/OT recommending skilled nursing facility on discharge.  TOC consulted.  DVT prophylaxis: heparin Code Status: DNR Family  Communication: None at bedside   Status is: Inpatient  Remains inpatient appropriate because:Unsafe d/c plan  Dispo: The patient is from: Home              Anticipated d/c is to: SNF              Patient currently is medically stable to d/c.   Difficult to place patient Yes   Consultants:  Neurosurgery [facility]  Procedures:  XRT  Antimicrobials:  None currently   Subjective: -Patient reports back pain.  Objective: Vitals:   06/07/21 0332 06/07/21 1318 06/07/21 2022 06/08/21 1311  BP: 109/84 (!) 131/91 130/89 114/87  Pulse: 95 (!) 109 91 (!) 110  Resp: 15 16 18 18   Temp: 98.3 F (36.8 C) 98.4 F (36.9 C) 97.8 F (36.6 C) 98.9 F (37.2 C)  TempSrc: Oral Oral  Oral  SpO2: 100% 100% 100% 100%  Weight:      Height:        Intake/Output Summary (Last 24 hours) at 06/08/2021 1727 Last data filed at 06/08/2021 1313 Gross per 24 hour  Intake 460 ml  Output --  Net 460 ml    Filed Weights   05/24/21 2208 05/25/21 0601 05/25/21 1737  Weight: 46.1 kg 46.1 kg 46.1 kg    Examination:  General: Patient is cachectic.  Patient is not in any distress.  Patient is awake and alert.   Cardiovascular: S1-S2 Respiratory: Clear to auscultation. GI: Soft and non-tender. Neuro: A&O x 3, no focal deficits Extremities: No leg edema.   Data Reviewed: I have personally reviewed following labs and imaging studies.  CBC: Recent Labs  Lab 06/05/21 0532 06/06/21 0539 06/07/21 0529  WBC 17.5* 17.2* 17.2*  NEUTROABS 14.9* 14.5* 14.8*  HGB 11.1* 11.0* 10.8*  HCT 34.4* 34.1* 34.9*  MCV 89.1 88.6 90.9  PLT 461* 449* 494*    Basic Metabolic Panel: Recent Labs  Lab 06/05/21 0532 06/07/21 0529  NA 135 135  K 4.5 4.2  CL 100 98  CO2 25 28  GLUCOSE 101* 112*  BUN 25* 26*  CREATININE 0.55* 0.53*  CALCIUM 9.2 8.9  MG  --  2.0    GFR: Estimated Creatinine Clearance: 69.6 mL/min (A) (by C-G formula based on SCr of 0.53 mg/dL (L)). Liver Function  Tests: Recent Labs  Lab 06/07/21 0529  AST 17  ALT 33  ALKPHOS 109  BILITOT 0.5  PROT 6.9  ALBUMIN 2.8*    No results for input(s): LIPASE, AMYLASE in the last 168 hours. No results for input(s): AMMONIA in the last 168 hours. Coagulation Profile: No results for input(s): INR, PROTIME in the last 168 hours. Cardiac Enzymes: No results for input(s):  CKTOTAL, CKMB, CKMBINDEX, TROPONINI in the last 168 hours. BNP (last 3 results) No results for input(s): PROBNP in the last 8760 hours. HbA1C: No results for input(s): HGBA1C in the last 72 hours. CBG: Recent Labs  Lab 06/07/21 1100 06/07/21 1637 06/08/21 0738 06/08/21 1123 06/08/21 1626  GLUCAP 222* 94 76 152* 106*    Lipid Profile: No results for input(s): CHOL, HDL, LDLCALC, TRIG, CHOLHDL, LDLDIRECT in the last 72 hours. Thyroid Function Tests: No results for input(s): TSH, T4TOTAL, FREET4, T3FREE, THYROIDAB in the last 72 hours. Anemia Panel: No results for input(s): VITAMINB12, FOLATE, FERRITIN, TIBC, IRON, RETICCTPCT in the last 72 hours. Sepsis Labs: No results for input(s): PROCALCITON, LATICACIDVEN in the last 168 hours.  No results found for this or any previous visit (from the past 240 hour(s)).    Radiology Studies: No results found.   Scheduled Meds:  (feeding supplement) PROSource Plus  30 mL Oral BID BM   dexamethasone  4 mg Oral Q12H   docusate sodium  100 mg Oral BID   feeding supplement  237 mL Oral TID BM   heparin  5,000 Units Subcutaneous Q8H   ketorolac  15 mg Intravenous Once   levETIRAcetam  500 mg Oral BID   pantoprazole  40 mg Oral QHS   sodium chloride flush  3 mL Intravenous Q12H   Continuous Infusions:  methocarbamol (ROBAXIN) IV       LOS: 15 days    Time spent: 25 minutes   Bonnell Public, MD [facility]  If 7PM-7AM, please contact night-coverage [email or web address] 06/08/2021, 5:27 PM

## 2021-06-09 LAB — GLUCOSE, CAPILLARY
Glucose-Capillary: 80 mg/dL (ref 70–99)
Glucose-Capillary: 160 mg/dL — ABNORMAL HIGH (ref 70–99)
Glucose-Capillary: 87 mg/dL (ref 70–99)

## 2021-06-09 NOTE — Progress Notes (Signed)
Pt has been refusing the Heparin injections for several days

## 2021-06-09 NOTE — Progress Notes (Signed)
PROGRESS NOTE    Dylan Shaw  BWG:[id number] DOB: 05-28-1966 DOA: 05/24/2021 PCP: Patient, No Pcp Per (Inactive)   Brief Narrative:   Patient is a 55 year old male with history of tobacco use who presented initially with complaints of 50 pound weight loss, fatigue, low back pain.  He was found to have evidence of lung and spine mass concerning for metastatic cancer.  Started on Decadron.  During this hospitalization he developed urinary and bowel incontinence leading to neurosurgical management and surgical decompression of the spine.  Oncology/radiation oncology consulted.  Surgical pathology showed poorly differentiated  Lung adenocarcinoma .  Hospital course complicated by acute encephalopathy/intermittent agitation requiring restraints.  Oncology team gave options for hospice versus palliative whole brain radiation therapy.  Family interested to proceed with whole brain radiation treatment/palliative chemotherapy.  Patient transferred to [facility] for whole brain radiation on 06/04/2021.  Patient was seen by PT and OT and recommended skilled nursing facility discharge.  He is medically stable for discharge to skilled nursing facility as soon as bed is available if he can be transported for radiation therapy here from skilled nursing facility.   06/09/2021: Patient is stable for discharge.   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       - Patient underwent vertebrectomy/laminectomy with resection of mass on 5/28 by neurosurgery.       - Surgical pathology showed poorly differentiated carcinoma with lung as the most likely primary.       - MRI confirmed metastatic brain lesions with extra edema, 11 mm rightward midline shift.     - Neurosurgery recommended to continue Decadron and Keppra.     - Palliative  care was also consulted during this hospitalization.   Patient is currently DNR.     - After  discussion, patient and family were interested on seeking cancer treatment.       - Decision was made to start whole brain radiation treatment and was transferred to [facility].       - Patient will follow up with oncology team for palliative chemotherapy as an outpatient at [facility].     - no acute events  . No changes for today   Altered mental status Metabolic encephalopathy secondary to brain mets, mass-effect, steroid use, seizure with postictal phase.      - He was on soft restraints; now discontinued.       - Continue monitoring mental status.       - Delirium precautions.       - Minimize opioids/sedatives.       - Patient does not have capacity to make decisions so cannot leave AMA.       - Brother makes the decision for the patient.       - Avoiding Haldol for the concern of seizure/reducing seizure threshold   Leukocytosis      - No signs of infection.       - Most likely associated with Decadron.      -repeat CBC in the morning.   Aphasia/focal seizure     - Code stroke was called on 5/29 with negative CT/MRI for stroke.       - EEG did not show any seizure activity.       - Neurology was also consulted.       - Currently on Keppra; continue     -06/09/2021:  Aphasia has resolved.   Severe protein calorie moderation     - Nutrition following.       - Has bitemporal wasting.       - BMI 15.9   Tobacco use     - Smoking cessation was counseled   Normocytic anemia     - Most likely associated with malignancy/chronic disease.     - no evidence of bleed, follow   Debility/deconditioning     - PT/OT recommending skilled nursing facility on discharge.  TOC consulted.  DVT prophylaxis: heparin Code Status: DNR Family Communication: None at bedside   Status is: Inpatient  Remains inpatient appropriate because:Unsafe d/c plan  Dispo: The patient is from: Home              Anticipated d/c is to: SNF              Patient  currently is medically stable to d/c.   Difficult to place patient Yes   Consultants:  Neurosurgery [facility] [place] [facility]  Procedures:  XRT  Antimicrobials:  None currently   Subjective: -Patient has no new complaints..  Objective: Vitals:   06/08/21 1311 06/08/21 2041 06/09/21 0440 06/09/21 1300  BP: 114/87 119/73 123/89 122/78  Pulse: (!) 110 87 75 68  Resp: 18 18 18 18   Temp: 98.9 F (37.2 C) 97.7 F (36.5 C) 98.3 F (36.8 C) 98 F (36.7 C)  TempSrc: Oral Oral Oral Oral  SpO2: 100% 100% 96% 98%  Weight:      Height:        Intake/Output Summary (Last 24 hours) at 06/09/2021 1532 Last data filed at 06/09/2021 1221 Gross per 24 hour  Intake 740 ml  Output --  Net 740 ml    Filed Weights   05/24/21 2208 05/25/21 0601 05/25/21 1737  Weight: 46.1 kg 46.1 kg 46.1 kg    Examination:  General: Patient is cachectic.  Patient is not in any distress.  Patient is awake and alert.   Cardiovascular: S1-S2 Respiratory: Clear to auscultation. GI: Soft and non-tender. Neuro: A&O x 3, no focal deficits Extremities: No leg edema.   Data Reviewed: I have personally reviewed following labs and imaging studies.  CBC: Recent Labs  Lab 06/05/21 0532 06/06/21 0539 06/07/21 0529  WBC 17.5* 17.2* 17.2*  NEUTROABS 14.9* 14.5* 14.8*  HGB 11.1* 11.0* 10.8*  HCT 34.4* 34.1* 34.9*  MCV 89.1 88.6 90.9  PLT 461* 449* 494*    Basic Metabolic Panel: Recent Labs  Lab 06/05/21 0532 06/07/21 0529  NA 135 135  K 4.5 4.2  CL 100 98  CO2 25 28  GLUCOSE 101* 112*  BUN 25* 26*  CREATININE 0.55* 0.53*  CALCIUM 9.2 8.9  MG  --  2.0    GFR: Estimated Creatinine Clearance: 69.6 mL/min (A) (by C-G formula based on SCr of 0.53 mg/dL (L)). Liver Function Tests: Recent Labs  Lab 06/07/21 0529  AST 17  ALT 33  ALKPHOS 109  BILITOT 0.5  PROT 6.9  ALBUMIN 2.8*    No results for input(s): LIPASE, AMYLASE in the last 168 hours. No results for input(s): AMMONIA in  the last 168 hours. Coagulation Profile: No results for input(s): INR, PROTIME in the last 168 hours. Cardiac Enzymes: No results for input(s): CKTOTAL, CKMB, CKMBINDEX, TROPONINI in the last 168 hours. BNP (last 3 results) No results for input(s): PROBNP in the last 8760 hours. HbA1C: No results for input(s): HGBA1C in the last 72 hours. CBG:  Recent Labs  Lab 06/08/21 0738 06/08/21 1123 06/08/21 1626 06/09/21 0833 06/09/21 1136  GLUCAP 76 152* 106* 80 160*    Lipid Profile: No results for input(s): CHOL, HDL, LDLCALC, TRIG, CHOLHDL, LDLDIRECT in the last 72 hours. Thyroid Function Tests: No results for input(s): TSH, T4TOTAL, FREET4, T3FREE, THYROIDAB in the last 72 hours. Anemia Panel: No results for input(s): VITAMINB12, FOLATE, FERRITIN, TIBC, IRON, RETICCTPCT in the last 72 hours. Sepsis Labs: No results for input(s): PROCALCITON, LATICACIDVEN in the last 168 hours.  No results found for this or any previous visit (from the past 240 hour(s)).    Radiology Studies: No results found.   Scheduled Meds:  (feeding supplement) PROSource Plus  30 mL Oral BID BM   dexamethasone  4 mg Oral Q12H   docusate sodium  100 mg Oral BID   feeding supplement  237 mL Oral TID BM   heparin  5,000 Units Subcutaneous Q8H   ketorolac  15 mg Intravenous Once   levETIRAcetam  500 mg Oral BID   pantoprazole  40 mg Oral QHS   sodium chloride flush  3 mL Intravenous Q12H   Continuous Infusions:  methocarbamol (ROBAXIN) IV       LOS: 16 days    Time spent: 15 minutes   Bonnell Public, MD [facility]  If 7PM-7AM, please contact night-coverage [email or web address] 06/09/2021, 3:32 PM

## 2021-06-10 ENCOUNTER — Ambulatory Visit
Admit: 2021-06-10 | Discharge: 2021-06-10 | Disposition: A | Discharge status: Home / Self Care | Payer: 59 | Attending: Radiation Oncology | Admitting: Radiation Oncology

## 2021-06-10 LAB — RENAL FUNCTION PANEL
Albumin: 2.5 g/dL — ABNORMAL LOW (ref 3.5–5.0)
Anion gap: 8 (ref 5–15)
BUN: 19 mg/dL (ref 6–20)
CO2: 29 mmol/L (ref 22–32)
Calcium: 9 mg/dL (ref 8.9–10.3)
Chloride: 100 mmol/L (ref 98–111)
Creatinine, Ser: 0.56 mg/dL — ABNORMAL LOW (ref 0.61–1.24)
GFR, Estimated: >60 mL/min (ref >60)
Glucose, Bld: 88 mg/dL (ref 70–99)
Phosphorus: 3.5 mg/dL (ref 2.5–4.6)
Potassium: 4.4 mmol/L (ref 3.5–5.1)
Sodium: 137 mmol/L (ref 135–145)

## 2021-06-10 LAB — CBC WITH DIFFERENTIAL/PLATELET
Abs Immature Granulocytes: 0.07 10*3/uL (ref 0.00–0.07)
Basophils Absolute: 0 10*3/uL (ref 0.0–0.1)
Basophils Relative: 0 %
Eosinophils Absolute: 0 10*3/uL (ref 0.0–0.5)
Eosinophils Relative: 0 %
HCT: 33.1 % — ABNORMAL LOW (ref 39.0–52.0)
Hemoglobin: 10.6 g/dL — ABNORMAL LOW (ref 13.0–17.0)
Immature Granulocytes: 1 %
Lymphocytes Relative: 10 %
Lymphs Abs: 1.4 10*3/uL (ref 0.7–4.0)
MCH: 29 pg (ref 26.0–34.0)
MCHC: 32 g/dL (ref 30.0–36.0)
MCV: 90.4 fL (ref 80.0–100.0)
Monocytes Absolute: 1 10*3/uL (ref 0.1–1.0)
Monocytes Relative: 7 %
Neutro Abs: 11 10*3/uL — ABNORMAL HIGH (ref 1.7–7.7)
Neutrophils Relative %: 82 %
Platelets: 492 10*3/uL — ABNORMAL HIGH (ref 150–400)
RBC: 3.66 MIL/uL — ABNORMAL LOW (ref 4.22–5.81)
RDW: 16.2 % — ABNORMAL HIGH (ref 11.5–15.5)
WBC: 13.4 10*3/uL — ABNORMAL HIGH (ref 4.0–10.5)
nRBC: 0 % (ref 0.0–0.2)

## 2021-06-10 LAB — GLUCOSE, CAPILLARY
Glucose-Capillary: 101 mg/dL — ABNORMAL HIGH (ref 70–99)
Glucose-Capillary: 152 mg/dL — ABNORMAL HIGH (ref 70–99)
Glucose-Capillary: 234 mg/dL — ABNORMAL HIGH (ref 70–99)

## 2021-06-10 LAB — MAGNESIUM: Magnesium: 2 mg/dL (ref 1.7–2.4)

## 2021-06-10 NOTE — Progress Notes (Signed)
PROGRESS NOTE    Dylan Shaw  ION:[id number] DOB: Mar 31, 1966 DOA: 05/24/2021 PCP: Patient, No Pcp Per (Inactive)   Brief Narrative:    Patient is a 55 years old male with history of cigarette smoking presented to hospital with significant weight loss fatigue low back pain.  He was found to have a lung cancer with metastasis to the spine.  He also developed urinary and bowel incontinence.  Neurosurgery was consulted and surgical tube compression was performed.  Subsequently oncology and radiation oncology were consulted.  Surgical pathology showed poorly differentiated lung adenocarcinoma.  Patient had prolonged hospital course complicated by encephalopathy intermittent agitation.  Oncology recommended hospice versus palliative whole brain radiation.  Family was interested in proceeding with whole brain radiation and palliative chemotherapy.  At this time patient is awaiting for skilled nursing facility and will need to be transferred to radiation therapy from skilled nursing facility   Assessment & Plan: 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   Oncology/radiation oncology, Pulmonology, neurosurgery were all consulted.  It is undergoing radiation treatment.  Patient underwent laminectomy, thoracolumbar decompression and fusion with resection of mass on 05/25/2021 by neurosurgery.  MRI also showed metastatic brain lesions.  Patient was on Decadron and Keppra.  Palliative care was also consulted.  Family wishes to continue with palliative radiation and chemotherapy as outpatient.  Metabolic encephalopathy secondary to brain mets, mass-effect, steroid use, seizure with postictal phase.      Appears to be much better at this time.  Minimize sedation opiates.  Brother is the person who helps him make decisions.  Try to avoid Haldol due to concern for seizures.    Leukocytosis  Mild.  No concern for infection.  On  steroids.  Aphasia/focal seizure     -EEG was negative for seizure.  Neurology was consulted.  On Keppra at this time.  Has improved at this time no further seizures.  Code stroke was called on 05/26/2021.  Negative CT for stroke.    Severe protein calorie moderation    Severe temporal wasting.  BMI 15.9.  Continue dietary supplements.     Tobacco use    Not on any patch at this time.   Normocytic anemia Likely secondary to malignancy.  Debility/deconditioning     - PT/OT recommending skilled nursing facility on discharge.  TOC consulted.  DVT prophylaxis: heparin  Code Status: DNR  Family Communication:  None at bedside    Status is: Inpatient  Remains inpatient appropriate because:Unsafe d/c plan, awaiting for skilled nursing facility placement.  Dispo: The patient is from: Home              Anticipated d/c is to: SNF              Patient currently is medically stable to d/c.   Difficult to place patient Yes   Consultants:  Neurosurgery [facility] [place] [facility]  Procedures:  XRT Laminectomy, decompressive spine surgery.  Antimicrobials:  None currently   Subjective: Today, patient was seen and examined at bedside.  Denies overt pain.  Denies any nausea vomiting fever or chills.  Objective: Vitals:   06/09/21 1300 06/09/21 1946 06/10/21 0332 06/10/21 1506  BP: 122/78 113/87 101/76 112/89  Pulse: 68 94 (!) 101 (!) 107  Resp: 18 18 15 16   Temp: 98 F (36.7 C) 97.6 F (36.4 C) 97.8 F (36.6 C) (!) 97.5 F (36.4 C)  TempSrc: Oral Oral Oral Oral  SpO2:  98% 100% 99% 100%  Weight:      Height:        Intake/Output Summary (Last 24 hours) at 06/10/2021 1513 Last data filed at 06/09/2021 2000 Gross per 24 hour  Intake 357 ml  Output --  Net 357 ml    Filed Weights   05/24/21 2208 05/25/21 0601 05/25/21 1737  Weight: 46.1 kg 46.1 kg 46.1 kg    Physical examination:  General: Cachectic with bitemporal wasting, not in obvious distress HENT:   No  scleral pallor or icterus noted. Oral mucosa is moist.  Chest:  Clear breath sounds.  Diminished breath sounds bilaterally. No crackles or wheezes.  CVS: S1 &S2 heard. No murmur.  Regular rate and rhythm. Abdomen: Soft, nontender, nondistended.  Bowel sounds are heard.   Extremities: No cyanosis, clubbing or edema.  Peripheral pulses are palpable. Psych: Alert, awake and oriented, normal mood CNS:  No cranial nerve deficits.  Power equal in all extremities.  Thin   extremities. Skin: Warm and dry.  No rashes noted.   Data Reviewed: I have personally reviewed following labs and imaging studies.  CBC: Recent Labs  Lab 06/05/21 0532 06/06/21 0539 06/07/21 0529 06/10/21 0656  WBC 17.5* 17.2* 17.2* 13.4*  NEUTROABS 14.9* 14.5* 14.8* 11.0*  HGB 11.1* 11.0* 10.8* 10.6*  HCT 34.4* 34.1* 34.9* 33.1*  MCV 89.1 88.6 90.9 90.4  PLT 461* 449* 494* 492*    Basic Metabolic Panel: Recent Labs  Lab 06/05/21 0532 06/07/21 0529 06/10/21 0656  NA 135 135 137  K 4.5 4.2 4.4  CL 100 98 100  CO2 25 28 29   GLUCOSE 101* 112* 88  BUN 25* 26* 19  CREATININE 0.55* 0.53* 0.56*  CALCIUM 9.2 8.9 9.0  MG  --  2.0 2.0  PHOS  --   --  3.5    GFR: Estimated Creatinine Clearance: 69.6 mL/min (A) (by C-G formula based on SCr of 0.56 mg/dL (L)). Liver Function Tests: Recent Labs  Lab 06/07/21 0529 06/10/21 0656  AST 17  --   ALT 33  --   ALKPHOS 109  --   BILITOT 0.5  --   PROT 6.9  --   ALBUMIN 2.8* 2.5*    No results for input(s): LIPASE, AMYLASE in the last 168 hours. No results for input(s): AMMONIA in the last 168 hours. Coagulation Profile: No results for input(s): INR, PROTIME in the last 168 hours. Cardiac Enzymes: No results for input(s): CKTOTAL, CKMB, CKMBINDEX, TROPONINI in the last 168 hours. BNP (last 3 results) No results for input(s): PROBNP in the last 8760 hours. HbA1C: No results for input(s): HGBA1C in the last 72 hours. CBG: Recent Labs  Lab 06/09/21 0833  06/09/21 1136 06/09/21 1632 06/10/21 0611 06/10/21 1128  GLUCAP 80 160* 87 101* 152*    Lipid Profile: No results for input(s): CHOL, HDL, LDLCALC, TRIG, CHOLHDL, LDLDIRECT in the last 72 hours. Thyroid Function Tests: No results for input(s): TSH, T4TOTAL, FREET4, T3FREE, THYROIDAB in the last 72 hours. Anemia Panel: No results for input(s): VITAMINB12, FOLATE, FERRITIN, TIBC, IRON, RETICCTPCT in the last 72 hours. Sepsis Labs: No results for input(s): PROCALCITON, LATICACIDVEN in the last 168 hours.  No results found for this or any previous visit (from the past 240 hour(s)).    Radiology Studies: No results found.   Scheduled Meds:  (feeding supplement) PROSource Plus  30 mL Oral BID BM   dexamethasone  4 mg Oral Q12H   docusate sodium  100 mg Oral  BID   feeding supplement  237 mL Oral TID BM   heparin  5,000 Units Subcutaneous Q8H   ketorolac  15 mg Intravenous Once   levETIRAcetam  500 mg Oral BID   pantoprazole  40 mg Oral QHS   sodium chloride flush  3 mL Intravenous Q12H   Continuous Infusions:  methocarbamol (ROBAXIN) IV       LOS: 17 days    Flora Lipps, MD [facility]  If 7PM-7AM, please contact night-coverage [email or web address] 06/10/2021, 3:13 PM

## 2021-06-10 NOTE — Progress Notes (Signed)
Physical Therapy Treatment Patient Details Name: Dylan Shaw MRN: [id number] DOB: May 13, 1966 Today's Date: 06/10/2021    History of Present Illness 55 y.o. man w/ new metastatic disease s/p thoracolumbar decompression and fusion on 05/25/21. Now w/ expressive aphasia 5/29 , self-limited, likely focal seizure. 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 smoker, 50 pound weight loss    PT Comments    Pt ambulated in hallway and requiring at least min assist for stability.  Pt also requires max cues for safety and presents as HIGH fall risk.  Pt not safe to d/c home alone.  Continue to recommend SNF.   Follow Up Recommendations  SNF     Equipment Recommendations  Rolling walker with 5" wheels;3in1 (PT)    Recommendations for Other Services       Precautions / Restrictions Precautions Precautions: Back Precaution Comments: reviewed back precautions - no carryover    Mobility  Bed Mobility Overal bed mobility: Needs Assistance Bed Mobility: Supine to Sit;Sit to Supine   Sidelying to sit: Supervision;HOB elevated   Sit to supine: Supervision;HOB elevated   General bed mobility comments: encouraged rolling due to back surgery however pt performing quickly supine to sit and returned same to put on socks (denies pain with bed mobility)    Transfers Overall transfer level: Needs assistance Equipment used: None Transfers: Sit to/from Stand Sit to Stand: Min assist         General transfer comment: pt declined using RW, assist for stabilizing once upright, significant lean to left requiring assist  Ambulation/Gait Ambulation/Gait assistance: Min assist Gait Distance (Feet): 250 Feet Assistive device: 2 person hand held assist Gait Pattern/deviations: Step-through pattern;Scissoring;Drifts right/left;Narrow base of support     General Gait Details: uncontrolled LEs and signiciant left lateral lean, pt requiring UE support and declined RW so provided HHA  for pt to self support   Stairs             Wheelchair Mobility    Modified Rankin (Stroke Patients Only)       Balance Overall balance assessment: Needs assistance Sitting-balance support: Feet supported;No upper extremity supported Sitting balance-Leahy Scale: Good     Standing balance support: Bilateral upper extremity supported Standing balance-Leahy Scale: Poor Standing balance comment: reliant on UE support                            Cognition Arousal/Alertness: Awake/alert Behavior During Therapy: Restless;Impulsive Overall Cognitive Status: Impaired/Different from baseline Area of Impairment: Memory;Safety/judgement;Awareness                     Memory: Decreased short-term memory;Decreased recall of precautions   Safety/Judgement: Decreased awareness of deficits     General Comments: pt able to follow simple 1-step commands; continues to have difficulty with multi-step commands due to decreased memory and attention; pt pleasant and cooperative      Exercises      General Comments General comments (skin integrity, edema, etc.): Spo2 100% on room air, HR 103 bpm      Pertinent Vitals/Pain Pain Assessment: Faces Faces Pain Scale: Hurts even more Pain Location: back Pain Descriptors / Indicators: Aching;Discomfort;Grimacing Pain Intervention(s): Repositioned;Monitored during session    Home Living                      Prior Function            PT Goals (current  goals can now be found in the care plan section) Acute Rehab PT Goals PT Goal Formulation: With patient Time For Goal Achievement: 06/24/21 Potential to Achieve Goals: Good Progress towards PT goals: Progressing toward goals    Frequency    Min 2X/week      PT Plan Current plan remains appropriate    Co-evaluation              AM-PAC PT "6 Clicks" Mobility   Outcome Measure  Help needed turning from your back to your side while in a  flat bed without using bedrails?: A Little Help needed moving from lying on your back to sitting on the side of a flat bed without using bedrails?: A Little Help needed moving to and from a bed to a chair (including a wheelchair)?: A Little Help needed standing up from a chair using your arms (e.g., wheelchair or bedside chair)?: A Lot Help needed to walk in hospital room?: A Lot Help needed climbing 3-5 steps with a railing? : Total 6 Click Score: 14    End of Session Equipment Utilized During Treatment: Gait belt Activity Tolerance: Patient tolerated treatment well Patient left: in chair;with call bell/phone within reach;with chair alarm set;with nursing/sitter in room Nurse Communication: Mobility status PT Visit Diagnosis: Unsteadiness on feet (R26.81);Other abnormalities of gait and mobility (R26.89)     Time: 1453-1510 PT Time Calculation (min) (ACUTE ONLY): 17 min  Charges:  $Gait Training: 8-22 mins                     Jannette Spanner PT, DPT Acute Rehabilitation Services Pager: [phone] Office: [phone]  Trena Platt 06/10/2021, 3:43 PM

## 2021-06-10 NOTE — Plan of Care (Signed)
  Problem: Education: Goal: Knowledge of General Education information will improve Description: Including pain rating scale, medication(s)/side effects and non-pharmacologic comfort measures Outcome: Progressing   Problem: Clinical Measurements: Goal: Ability to maintain clinical measurements within normal limits will improve Outcome: Progressing   Problem: Activity: Goal: Risk for activity intolerance will decrease Outcome: Progressing   Problem: Nutrition: Goal: Adequate nutrition will be maintained Outcome: Progressing   Problem: Coping: Goal: Level of anxiety will decrease Outcome: Progressing   Problem: Pain Managment: Goal: General experience of comfort will improve Outcome: Progressing   Problem: Safety: Goal: Ability to remain free from injury will improve Outcome: Progressing   Problem: Skin Integrity: Goal: Risk for impaired skin integrity will decrease Outcome: Progressing   Problem: Safety: Goal: Non-violent Restraint(s) Outcome: Progressing

## 2021-06-10 NOTE — TOC Progression Note (Signed)
Transition of Care [facility]) - Progression Note    Patient Details  Name: Dylan Shaw MRN: [id number] Date of Birth: 07-02-1966  Transition of Care [facility] CM/SW [place], [place] Phone Number: 06/10/2021, 2:12 PM  Clinical Narrative:   Spoke with Ebony Hail at [facility] to get further clarification as I had heard her saying earlier that she would not consider him as long as he was getting radiation.  Ebony Hail reports that without a payor source for LTC, they are not willing to make bed offer as they believe he will flip to LTC, and he does not have MCD nor disability. TOC will continue to follow during the course of hospitalization.     Expected Discharge Plan: Skilled Nursing Facility Barriers to Discharge: SNF Pending bed offer  Expected Discharge Plan and Services Expected Discharge Plan: [place]   Discharge Planning Services: CM Consult Post Acute Care Choice: [place] Living arrangements for the past 2 months: Single Family Home                                       Social Determinants of Health (SDOH) Interventions    Readmission Risk Interventions No flowsheet data found.

## 2021-06-11 ENCOUNTER — Ambulatory Visit
Admit: 2021-06-11 | Discharge: 2021-06-11 | Disposition: A | Discharge status: Home / Self Care | Payer: 59 | Attending: Radiation Oncology | Admitting: Radiation Oncology

## 2021-06-11 LAB — GLUCOSE, CAPILLARY
Glucose-Capillary: 106 mg/dL — ABNORMAL HIGH (ref 70–99)
Glucose-Capillary: 94 mg/dL (ref 70–99)

## 2021-06-11 NOTE — Progress Notes (Signed)
Chaplain visited patient in response to referral from staff chaplain who said patient had something to be notarized.  Patient was awake when chaplain entered room but had difficulty finding words and was remembering things. He appeared disoriented.  Chaplain asked about what he wanted notarized and he said "a letter" and then he said "a witness."  When chaplain asked for the document he said he didn't know where it was. "It's in a %^ thing."  Patient began cursing and shrugged.  Chaplain conferred with nurse, who said she had not heard of document to be notarized and agreed that patient was not oriented sufficiently to execute a legal document at this time. "He's been out of it today." Chaplain will share info with person making the referral. Rev. Tamsen Snider Pager [phone]

## 2021-06-11 NOTE — Progress Notes (Signed)
PROGRESS NOTE    Dylan Shaw  ERX:[id number] DOB: 12-25-1966 DOA: 05/24/2021 PCP: Patient, No Pcp Per (Inactive)   Brief Narrative:    Patient is a 55 years old male with history of cigarette smoking presented to hospital with significant weight loss fatigue low back pain.  He was found to have a lung cancer with metastasis to the spine.  He also developed urinary and bowel incontinence.  Neurosurgery was consulted and surgical tube compression was performed.  Subsequently, oncology and radiation oncology were consulted.  Surgical pathology showed poorly differentiated lung adenocarcinoma.  Patient had prolonged hospital course complicated by encephalopathy intermittent agitation.  Oncology recommended hospice versus palliative whole brain radiation.  Family was interested in proceeding with whole brain radiation and palliative chemotherapy.  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    Oncology/radiation oncology, Pulmonology, neurosurgery were all consulted.  Patient is undergoing radiation treatment.  Patient underwent laminectomy, thoracolumbar decompression and fusion with resection of mass on 05/25/2021 by neurosurgery.  MRI also showed metastatic brain lesions.  Continue Decadron and Keppra.  Palliative care was also consulted.  Family wishes to continue with palliative radiation and chemotherapy as outpatient.  Metabolic encephalopathy secondary to brain mets, mass-effect, steroid use, seizure with postictal phase.   Appears to be much better at this time.  Minimize sedation, opiates.  Brother is the person who helps him make decisions.  Try to avoid Haldol due to concern for seizures.    Leukocytosis  Mild.  No concern for infection.  On  steroids.  Aphasia/focal seizure     -EEG was negative for seizure.  Neurology was consulted.  On Keppra at this time.  Has improved at this time,  no further seizures.  Code stroke was called on 05/26/2021.  Negative CT for stroke.    Severe protein calorie moderation Severe temporal wasting.  BMI 15.9.  Continue dietary supplements.     Tobacco use    Not on any patch at this time.   Normocytic anemia Likely secondary to malignancy.  Debility/deconditioning     - PT/OT recommending skilled nursing facility on discharge.  TOC consulted.  DVT prophylaxis: heparin  Code Status: DNR  Family Communication:  None at bedside    Status is: Inpatient  Remains inpatient appropriate because:Unsafe d/c plan, awaiting for skilled nursing facility placement.  Dispo: The patient is from: Home              Anticipated d/c is to: SNF              Patient currently is medically stable to d/c.   Difficult to place patient Yes   Consultants:  Neurosurgery [facility]  Procedures:  XRT Laminectomy, decompressive spine surgery.  Antimicrobials:  None currently   Subjective: Today, patient was seen and examined at bedside.  Denies overt pain nausea vomiting fever or chills.  Objective: Vitals:   06/10/21 0332 06/10/21 1506 06/10/21 2015 06/11/21 0430  BP: 101/76 112/89 124/81 (!) 128/91  Pulse: (!) 101 (!) 107 (!) 108 88  Resp: 15 16 18 18   Temp: 97.8 F (36.6 C) (!) 97.5 F (36.4 C) 98 F (36.7 C) (!) 97.4 F (36.3 C)  TempSrc: Oral Oral    SpO2: 99% 100% 100% 99%  Weight:      Height:        Intake/Output Summary (Last 24 hours) at 06/11/2021 1120 Last data filed at 06/11/2021 0826 Gross per 24 hour  Intake 120 ml  Output --  Net 120 ml    Filed Weights   05/24/21 2208 05/25/21 0601 05/25/21 1737  Weight: 46.1 kg 46.1 kg 46.1 kg    Physical examination:  General: Cachectic with bitemporal wasting, not in obvious distress HENT:   No scleral  pallor or icterus noted. Oral mucosa is moist.  Chest:  Clear breath sounds.  Diminished breath sounds bilaterally. No crackles or wheezes.  CVS: S1 &S2 heard. No murmur.  Regular rate and rhythm. Abdomen: Soft, nontender, nondistended.  Bowel sounds are heard.   Extremities: No cyanosis, clubbing or edema.  Peripheral pulses are palpable. Psych: Alert, awake and oriented, normal mood CNS:  No cranial nerve deficits.  Power equal in all extremities.  Thin   extremities. Skin: Warm and dry.  No rashes noted.   Data Reviewed: I have personally reviewed following labs and imaging studies.  CBC: Recent Labs  Lab 06/05/21 0532 06/06/21 0539 06/07/21 0529 06/10/21 0656  WBC 17.5* 17.2* 17.2* 13.4*  NEUTROABS 14.9* 14.5* 14.8* 11.0*  HGB 11.1* 11.0* 10.8* 10.6*  HCT 34.4* 34.1* 34.9* 33.1*  MCV 89.1 88.6 90.9 90.4  PLT 461* 449* 494* 492*    Basic Metabolic Panel: Recent Labs  Lab 06/05/21 0532 06/07/21 0529 06/10/21 0656  NA 135 135 137  K 4.5 4.2 4.4  CL 100 98 100  CO2 25 28 29   GLUCOSE 101* 112* 88  BUN 25* 26* 19  CREATININE 0.55* 0.53* 0.56*  CALCIUM 9.2 8.9 9.0  MG  --  2.0 2.0  PHOS  --   --  3.5    GFR: Estimated Creatinine Clearance: 69.6 mL/min (A) (by C-G formula based on SCr of 0.56 mg/dL (L)). Liver Function Tests: Recent Labs  Lab 06/07/21 0529 06/10/21 0656  AST 17  --   ALT 33  --   ALKPHOS 109  --   BILITOT 0.5  --   PROT 6.9  --   ALBUMIN 2.8* 2.5*    No results for input(s): LIPASE, AMYLASE in the last 168 hours. No results for input(s): AMMONIA in the last 168 hours. Coagulation Profile: No results for input(s): INR, PROTIME in the last 168 hours. Cardiac Enzymes: No results for input(s): CKTOTAL, CKMB, CKMBINDEX, TROPONINI in the last 168 hours. BNP (last 3 results) No results for input(s): PROBNP in the last 8760 hours. HbA1C: No results for input(s): HGBA1C in the last 72 hours. CBG: Recent Labs  Lab 06/10/21 0611  06/10/21 1128 06/10/21 1701 06/11/21 0621 06/11/21 1111  GLUCAP 101* 152* 234* 106* 94    Lipid Profile: No results for input(s): CHOL, HDL, LDLCALC, TRIG, CHOLHDL, LDLDIRECT in the last 72 hours. Thyroid Function Tests: No results for input(s): TSH, T4TOTAL, FREET4, T3FREE, THYROIDAB in the last 72 hours. Anemia Panel: No results for input(s): VITAMINB12, FOLATE, FERRITIN, TIBC, IRON, RETICCTPCT in the last 72 hours. Sepsis Labs: No results for input(s): PROCALCITON, LATICACIDVEN in the last 168 hours.  No results found for this or any previous visit (from the past 240 hour(s)).    Radiology Studies: No results found.   Scheduled Meds:  (feeding supplement) PROSource Plus  30 mL Oral BID BM   dexamethasone  4 mg Oral Q12H   docusate sodium  100 mg Oral BID   feeding supplement  237 mL Oral TID BM   heparin  5,000 Units Subcutaneous Q8H   ketorolac  15 mg Intravenous Once   levETIRAcetam  500 mg Oral BID   pantoprazole  40 mg Oral QHS   sodium chloride flush  3 mL Intravenous Q12H   Continuous Infusions:  methocarbamol (ROBAXIN) IV       LOS: 18 days    Flora Lipps, MD [facility]  If 7PM-7AM, please contact night-coverage [email or web address] 06/11/2021, 11:20 AM

## 2021-06-12 ENCOUNTER — Ambulatory Visit
Admit: 2021-06-12 | Discharge: 2021-06-12 | Disposition: A | Discharge status: Home / Self Care | Payer: 59 | Attending: Radiation Oncology | Admitting: Radiation Oncology

## 2021-06-12 LAB — GLUCOSE, CAPILLARY
Glucose-Capillary: 98 mg/dL (ref 70–99)
Glucose-Capillary: 103 mg/dL — ABNORMAL HIGH (ref 70–99)
Glucose-Capillary: 121 mg/dL — ABNORMAL HIGH (ref 70–99)
Glucose-Capillary: 115 mg/dL — ABNORMAL HIGH (ref 70–99)

## 2021-06-12 NOTE — Progress Notes (Signed)
Occupational Therapy Treatment Patient Details Name: Dylan Shaw MRN: [id number] DOB: January 24, 1966 Today's Date: 06/12/2021    History of present illness 55 y.o. man w/ new metastatic disease s/p thoracolumbar decompression and fusion on 05/25/21. Now w/ expressive aphasia 5/29 , self-limited, likely focal seizure. 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 smoker, 50 pound weight loss   OT comments  Patient participates cooperatively, remains impulsive, confused and aphasic with difficulty following all commands, but unable to follow any commands after 1-step. Due to these cognitive deficits, progress is difficult to observe and pt needs constant supervision to ensure his safety.  Physically, pt does not need much assistance other than Min guard, however requires near constant 1-step cues and multimodal tactile/vision cues to assist pt with comprehension. Pt continues to demonstrate fair rehab potential and may benefit from continued skilled OT to increase safety and independence with ADLs and functional transfers to allow pt to return home safely and reduce caregiver burden and fall risk.   Follow Up Recommendations  SNF    Equipment Recommendations  Other (comment);3 in 1 bedside commode    Recommendations for Other Services Speech consult    Precautions / Restrictions Precautions Precautions: Back Precaution Comments: reviewed back precautions - no carryover. Pt with poor attention to education. Unable to repeat any back directly after education. Restrictions Weight Bearing Restrictions: No       Mobility Bed Mobility               General bed mobility comments: Pt up in chair.    Transfers Overall transfer level: Needs assistance Equipment used: Rolling walker (2 wheeled) Transfers: Sit to/from Stand Sit to Stand: Min guard         General transfer comment: Pt moves quickly with poor safety awareness and RW serving to slow pt down a little. Pt  needed Min guard to stear in appropriate direction due to command, "come stand at the sink" was unable to be followed by the pt.    Balance Overall balance assessment: Needs assistance Sitting-balance support: Feet supported;No upper extremity supported Sitting balance-Leahy Scale: Good Sitting balance - Comments: Impulsive and needs supervision.   Standing balance support: Single extremity supported Standing balance-Leahy Scale: Poor Standing balance comment: Impulsive and does not anticipate fall risks.                           ADL either performed or assessed with clinical judgement   ADL   Eating/Feeding: Set up;Sitting;Supervision/ safety (Required cues to initiate: verbal and fork placed in RT hand. Pt then able to feed self with some spilling-towel laid in lap.)   Grooming: Supervision/safety;Wash/dry hands;Wash/dry face;Min guard;Standing;Oral care;Cueing for sequencing Grooming Details (indicate cue type and reason): standing at sink; single UE supported, cues for sequencing, Min guard for task due to confusion.               Lower Body Dressing Details (indicate cue type and reason): able to figure 4 cross and don socks while sitting edge of recliner with supervision.       Toileting - Clothing Manipulation Details (indicate cue type and reason): Currently on condom catheter     Functional mobility during ADLs: Min guard;Supervision/safety;Cueing for safety;Cueing for sequencing       Vision   Additional Comments: Pt unable to read any large words close up or far away on TV. Pt reported color of pt's blue gown, OT's purple  scrubs and pt's white towel all as "Green". With 3 choices, pt able to identify OT scrubs and pt's gown colors correctly but continued to repeat then persverate on word "Blue, blue blue" for towel.  Pt is able to find food on tray and toothbrush on vainty without cues, therefore may be aphasia/cognitive deficit more than vision.    Perception     Praxis      Cognition Arousal/Alertness: Awake/alert Behavior During Therapy: Restless;Impulsive Overall Cognitive Status: Impaired/Different from baseline Area of Impairment: Memory;Safety/judgement;Awareness;Orientation;Attention                 Orientation Level: Disoriented to;Place;Situation;Time (Able to say "Long" after cue of "[name]") Current Attention Level:  (very easily distracted.) Memory: Decreased recall of precautions;Decreased short-term memory   Safety/Judgement: Decreased awareness of deficits;Decreased awareness of safety Awareness:  (globally impaired)   General Comments: Pt very confused and impulsive with poor judgment.  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        Exercises     Shoulder Instructions       General Comments      Pertinent Vitals/ Pain       Pain Assessment: No/denies pain  Home Living                                          Prior Functioning/Environment              Frequency  Min 2X/week        Progress Toward Goals  OT Goals(current goals can now be found in the care plan section)  Progress towards OT goals: Progressing toward goals  Acute Rehab OT Goals OT Goal Formulation: Patient unable to participate in goal setting Time For Goal Achievement: 06/18/21 ADL Goals Pt Will Perform Grooming: with supervision;standing Pt Will Perform Upper Body Bathing: with set-up;with supervision Pt Will Perform Lower Body Bathing: with supervision;sitting/lateral leans;sit to/from stand Pt Will Transfer to Toilet: with supervision;ambulating Pt Will Perform Toileting - Clothing Manipulation and hygiene: with supervision  Plan Discharge plan remains appropriate    Co-evaluation                 AM-PAC OT "6 Clicks" Daily Activity     Outcome Measure   Help from another person eating meals?: A  Little Help from another person taking care of personal grooming?: A Little Help from another person toileting, which includes using toliet, bedpan, or urinal?: A Little Help from another person bathing (including washing, rinsing, drying)?: A Little Help from another person to put on and taking off regular upper body clothing?: A Little Help from another person to put on and taking off regular lower body clothing?: A Little 6 Click Score: 18    End of Session Equipment Utilized During Treatment: Gait belt;Rolling walker  OT Visit Diagnosis: Unsteadiness on feet (R26.81);Muscle weakness (generalized) (M62.81);Cognitive communication deficit (R41.841)   Activity Tolerance Patient tolerated treatment well   Patient Left with call bell/phone within reach;with nursing/sitter in room;in chair;with chair alarm set   Nurse Communication Mobility status (condom cathter off.)        Time: 5852-7782 OT Time Calculation (min): 47 min  Charges: OT General Charges $OT Visit: 1 Visit OT Treatments $Self Care/Home Management : 23-37 mins $Therapeutic Activity: 8-22 mins  Anderson Malta, OT Acute  Rehab Services Office: [phone] 06/12/2021   Julien Girt 06/12/2021, 2:30 PM

## 2021-06-12 NOTE — Progress Notes (Signed)
Nutrition Follow-up  DOCUMENTATION CODES:   Underweight, Severe malnutrition in context of chronic illness  INTERVENTION:   -Prosource Plus PO BID, each provides 100 kcals and 15g protein   -Ensure Enlive po TID, each supplement provides 350 kcal and 20 grams of protein   -Magic cup TID with meals, each supplement provides 290 kcal and 9 grams of protein  NUTRITION DIAGNOSIS:   Severe Malnutrition related to chronic illness (lung cancer with metastasis to spine and brain) as evidenced by severe muscle depletion, severe fat depletion.  Ongoing.  GOAL:   Patient will meet greater than or equal to 90% of their needs  Progressing.   MONITOR:   PO intake, Supplement acceptance, Weight trends, Labs, I & O's  ASSESSMENT:   Patient with PMH significant for tobacco use. Presents this admission with T12 metastatic lesion with epidural extension (presumed lung primary).  Pt currently alert/oriented x 1. Pt's intakes have been fluctuating. On 6/12-6/14, pt consumed 75-100% of meals. Now consuming 0-25% of meals. Pt accepting Ensure and Prosource supplements.  Admission weight: 101 lbs. No other weights this admission.   Medications: Colace   Labs reviewed: CBGs: 94-234  Diet Order:   Diet Order             Diet regular Room service appropriate? No; Fluid consistency: Thin  Diet effective now                   EDUCATION NEEDS:   Not appropriate for education at this time  Skin:  Skin Assessment: Skin Integrity Issues: Skin Integrity Issues:: Incisions Incisions: back  Last BM:  6/7 -type 4  Height:   Ht Readings from Last 1 Encounters:  05/25/21 5\' 7"  (1.702 m)    Weight:   Wt Readings from Last 1 Encounters:  05/25/21 46.1 kg    BMI:  Body mass index is 15.92 kg/m.  Estimated Nutritional Needs:   Kcal:  1500-1700 kcal  Protein:  75-90 grams  Fluid:  >/= 1.5 L/day  Dylan Bibles, MS, RD, LDN Inpatient Clinical Dietitian Contact  information available via Amion

## 2021-06-12 NOTE — Progress Notes (Signed)
PROGRESS NOTE    Garo Heidelberg  BJY:[id number] DOB: [birth year] DOA: 05/24/2021 PCP: Patient, No Pcp Per (Inactive)   Brief Narrative:    Patient is a 55 years old male with history of cigarette smoking presented to hospital with significant weight loss fatigue low back pain.  He was found to have a lung cancer with metastasis to the spine.  He also developed urinary and bowel incontinence.  Neurosurgery was consulted and surgical tube compression was performed.  Subsequently, oncology and radiation oncology were consulted.  Surgical pathology showed poorly differentiated lung adenocarcinoma.  Patient had prolonged hospital course complicated by encephalopathy intermittent agitation.  Oncology recommended hospice versus palliative whole brain radiation.  Family was interested in proceeding with whole brain radiation and palliative chemotherapy.  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    Oncology/radiation oncology, Pulmonology, neurosurgery were all consulted.  Patient is undergoing radiation treatment.  Patient underwent laminectomy, thoracolumbar decompression and fusion with resection of mass on 05/25/2021 by neurosurgery.  MRI also showed metastatic brain lesions.  Continue Decadron and Keppra.  Palliative care was also consulted.  Family wishes to continue with palliative radiation and chemotherapy as outpatient.  Metabolic encephalopathy secondary to brain mets, mass-effect, steroid use, seizure with postictal phase.   Improved.  Try to avoid Haldol due to concern for seizures.    Leukocytosis  Mild.  No concern for infection.  On steroids.  Aphasia/focal seizure     -EEG was negative for seizure.  Neurology was consulted.  On Keppra at this time.  Has  improved at this time,  no further seizures.  Code stroke was called on 05/26/2021.  Negative CT for stroke.    Severe protein calorie moderation Severe temporal wasting.  BMI 15.9.  Continue dietary supplements.     Tobacco use    Not on any patch at this time.   Normocytic anemia Likely secondary to malignancy.  Debility/deconditioning     - PT/OT recommending skilled nursing facility on discharge.    DVT prophylaxis: heparin subcu  Code Status: DNR  Family Communication:  None at bedside    Status is: Inpatient  Remains inpatient appropriate because:Unsafe d/c plan, awaiting for skilled nursing facility placement.  Dispo: The patient is from: Home              Anticipated d/c is to: SNF              Patient currently is medically stable to d/c.   Difficult to place patient Yes   Consultants:  Neurosurgery [facility]  Procedures:  XRT Laminectomy, decompressive spine surgery.  Antimicrobials:  None currently   Subjective: Today, patient was seen and examined at bedside.  Denies any interval complaints.  Denies any dizziness lightheadedness shortness of breath fever chills or overt pain.  Objective: Vitals:   06/11/21 0430 06/11/21 1350 06/11/21 2030 06/12/21 0412  BP: (!) 128/91 (!) 145/98 (!) 146/94 (!) 144/99  Pulse: 88 99 87 100  Resp: 18 20 18 18   Temp: (!) 97.4 F (36.3 C) 98 F (36.7 C) 97.9 F (36.6 C) 97.7 F (36.5 C)  TempSrc:  Oral Oral Oral  SpO2: 99% 100% 100% 96%  Weight:      Height:  Intake/Output Summary (Last 24 hours) at 06/12/2021 1147 Last data filed at 06/12/2021 0858 Gross per 24 hour  Intake 804 ml  Output --  Net 804 ml    Filed Weights   05/24/21 2208 05/25/21 0601 05/25/21 1737  Weight: 46.1 kg 46.1 kg 46.1 kg    Physical examination:  General: Thinly built, bitemporal wasting, not in obvious distress HENT:   No scleral pallor or icterus noted. Oral mucosa is moist.  Chest:  Clear breath  sounds.  Diminished breath sounds bilaterally. No crackles or wheezes.  CVS: S1 &S2 heard. No murmur.  Regular rate and rhythm. Abdomen: Soft, nontender, nondistended.  Bowel sounds are heard.   Extremities: No cyanosis, clubbing or edema.  Peripheral pulses are palpable. Psych: Alert, awake and oriented, normal mood CNS:  No cranial nerve deficits.  Power equal in all extremities.  Thin   extremities. Skin: Warm and dry.  No rashes noted.   Data Reviewed: I have personally reviewed following labs and imaging studies.  CBC: Recent Labs  Lab 06/06/21 0539 06/07/21 0529 06/10/21 0656  WBC 17.2* 17.2* 13.4*  NEUTROABS 14.5* 14.8* 11.0*  HGB 11.0* 10.8* 10.6*  HCT 34.1* 34.9* 33.1*  MCV 88.6 90.9 90.4  PLT 449* 494* 492*    Basic Metabolic Panel: Recent Labs  Lab 06/07/21 0529 06/10/21 0656  NA 135 137  K 4.2 4.4  CL 98 100  CO2 28 29  GLUCOSE 112* 88  BUN 26* 19  CREATININE 0.53* 0.56*  CALCIUM 8.9 9.0  MG 2.0 2.0  PHOS  --  3.5    GFR: Estimated Creatinine Clearance: 69.6 mL/min (A) (by C-G formula based on SCr of 0.56 mg/dL (L)). Liver Function Tests: Recent Labs  Lab 06/07/21 0529 06/10/21 0656  AST 17  --   ALT 33  --   ALKPHOS 109  --   BILITOT 0.5  --   PROT 6.9  --   ALBUMIN 2.8* 2.5*    No results for input(s): LIPASE, AMYLASE in the last 168 hours. No results for input(s): AMMONIA in the last 168 hours. Coagulation Profile: No results for input(s): INR, PROTIME in the last 168 hours. Cardiac Enzymes: No results for input(s): CKTOTAL, CKMB, CKMBINDEX, TROPONINI in the last 168 hours. BNP (last 3 results) No results for input(s): PROBNP in the last 8760 hours. HbA1C: No results for input(s): HGBA1C in the last 72 hours. CBG: Recent Labs  Lab 06/11/21 0621 06/11/21 1111 06/11/21 1630 06/12/21 0623 06/12/21 1133  GLUCAP 106* 94 98 103* 121*    Lipid Profile: No results for input(s): CHOL, HDL, LDLCALC, TRIG, CHOLHDL, LDLDIRECT in the  last 72 hours. Thyroid Function Tests: No results for input(s): TSH, T4TOTAL, FREET4, T3FREE, THYROIDAB in the last 72 hours. Anemia Panel: No results for input(s): VITAMINB12, FOLATE, FERRITIN, TIBC, IRON, RETICCTPCT in the last 72 hours. Sepsis Labs: No results for input(s): PROCALCITON, LATICACIDVEN in the last 168 hours.  No results found for this or any previous visit (from the past 240 hour(s)).    Radiology Studies: No results found.   Scheduled Meds:  (feeding supplement) PROSource Plus  30 mL Oral BID BM   dexamethasone  4 mg Oral Q12H   docusate sodium  100 mg Oral BID   feeding supplement  237 mL Oral TID BM   heparin  5,000 Units Subcutaneous Q8H   levETIRAcetam  500 mg Oral BID   pantoprazole  40 mg Oral QHS   sodium chloride flush  3 mL  Intravenous Q12H   Continuous Infusions:  methocarbamol (ROBAXIN) IV       LOS: 19 days    Flora Lipps, MD [facility]  If 7PM-7AM, please contact night-coverage [email or web address] 06/12/2021, 11:47 AM

## 2021-06-13 ENCOUNTER — Ambulatory Visit
Admit: 2021-06-13 | Discharge: 2021-06-13 | Disposition: A | Discharge status: Home / Self Care | Payer: 59 | Attending: Radiation Oncology | Admitting: Radiation Oncology

## 2021-06-13 DIAGNOSIS — C7951 Secondary malignant neoplasm of bone: Secondary | ICD-10-CM | POA: Diagnosis not present

## 2021-06-13 DIAGNOSIS — C7931 Secondary malignant neoplasm of brain: Secondary | ICD-10-CM | POA: Diagnosis not present

## 2021-06-13 DIAGNOSIS — E43 Unspecified severe protein-calorie malnutrition: Secondary | ICD-10-CM | POA: Diagnosis not present

## 2021-06-13 DIAGNOSIS — C3431 Malignant neoplasm of lower lobe, right bronchus or lung: Secondary | ICD-10-CM | POA: Diagnosis not present

## 2021-06-13 LAB — GLUCOSE, CAPILLARY
Glucose-Capillary: 102 mg/dL — ABNORMAL HIGH (ref 70–99)
Glucose-Capillary: 107 mg/dL — ABNORMAL HIGH (ref 70–99)
Glucose-Capillary: 104 mg/dL — ABNORMAL HIGH (ref 70–99)

## 2021-06-13 NOTE — Progress Notes (Signed)
Physical Therapy Treatment Patient Details Name: Dylan Shaw MRN: [id number] DOB: November 28, 1966 Today's Date: 06/13/2021    History of Present Illness 55 y.o. man w/ new metastatic disease s/p thoracolumbar decompression and fusion on 05/25/21. Now w/ expressive aphasia 5/29 , self-limited, likely focal seizure. 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 smoker, 50 pound weight loss    PT Comments    Pt curled up at the end of recliner (over leg section) so assisted with repositioning.  Pt agreeable to mobilize and ambulated in hallway however pt limited distance today.  Pt returned to recliner and therapist positioned bed in front of recliner for pt to have legs on bed (in case he scoots forward, he would only scoot out onto bed instead of floor).  RN and tech notified.  Pt provided with lunch tray and stated he wasn't hungry but started eating.  Provided spoon for soup as pt using fork.  Pt will require assist/supervision upon d/c due to decreased cognition, poor safety awareness, and increased fall risk.     Follow Up Recommendations  SNF     Equipment Recommendations  Rolling walker with 5" wheels;3in1 (PT)    Recommendations for Other Services       Precautions / Restrictions Precautions Precautions: Back Precaution Comments: reviewed back precautions - no carryover.    Mobility  Bed Mobility               General bed mobility comments: pt in recliner and almost to edge of reclined piece so assisted pt with scooting back into recliner to drop legrest for mobility    Transfers Overall transfer level: Needs assistance Equipment used: Rolling walker (2 wheeled) Transfers: Sit to/from Stand Sit to Stand: Min guard         General transfer comment: cues for safety  Ambulation/Gait Ambulation/Gait assistance: Min assist Gait Distance (Feet): 160 Feet Assistive device: Rolling walker (2 wheeled) Gait Pattern/deviations: Step-through  pattern;Scissoring;Drifts right/left;Narrow base of support     General Gait Details: uncontrolled LEs and left lateral lean, provided RW for ambulating (did not ask pt today just provided for safety/stability)   Stairs             Wheelchair Mobility    Modified Rankin (Stroke Patients Only)       Balance Overall balance assessment: Needs assistance         Standing balance support: Bilateral upper extremity supported Standing balance-Leahy Scale: Poor                              Cognition Arousal/Alertness: Awake/alert Behavior During Therapy: Restless;Impulsive Overall Cognitive Status: Impaired/Different from baseline Area of Impairment: Memory;Safety/judgement;Awareness                     Memory: Decreased short-term memory;Decreased recall of precautions   Safety/Judgement: Decreased awareness of deficits     General Comments: pt able to follow simple 1-step commands; continues to have difficulty with multi-step commands due to decreased memory and attention; pt pleasant and cooperative      Exercises      General Comments        Pertinent Vitals/Pain Pain Assessment: No/denies pain    Home Living                      Prior Function            PT  Goals (current goals can now be found in the care plan section) Progress towards PT goals: Progressing toward goals    Frequency    Min 2X/week      PT Plan Current plan remains appropriate    Co-evaluation              AM-PAC PT "6 Clicks" Mobility   Outcome Measure  Help needed turning from your back to your side while in a flat bed without using bedrails?: A Little Help needed moving from lying on your back to sitting on the side of a flat bed without using bedrails?: A Little Help needed moving to and from a bed to a chair (including a wheelchair)?: A Little Help needed standing up from a chair using your arms (e.g., wheelchair or bedside  chair)?: A Lot Help needed to walk in hospital room?: A Lot Help needed climbing 3-5 steps with a railing? : Total 6 Click Score: 14    End of Session Equipment Utilized During Treatment: Gait belt Activity Tolerance: Patient tolerated treatment well Patient left: in chair;with call bell/phone within reach;with chair alarm set Nurse Communication: Mobility status PT Visit Diagnosis: Unsteadiness on feet (R26.81);Other abnormalities of gait and mobility (R26.89)     Time: 6580-0634 PT Time Calculation (min) (ACUTE ONLY): 15 min  Charges:  $Gait Training: 8-22 mins                     Arlyce Dice, DPT Acute Rehabilitation Services Pager: [phone] Office: [phone]   York Ram E 06/13/2021, 3:39 PM

## 2021-06-13 NOTE — Progress Notes (Signed)
Chaplain rec'd note from [place] about AD completion.  Patient was asleep when chaplain entered room.  Chaplain conferred with nurse for second time this week.  The nurses who have been caring for him say that he is not alert and oriented X 4.  He is only oriented to himself.  Chaplain spoke with CSW and said that the patient's mental status would preclude him from executing a AD at this time. He must understand what he is signing as it is a legal document.  CSW and nurses said that they would explain this to the patient's brother when he comes to visit him tomorrow. Chaplain will be available if needed further.  Rev. Tamsen Snider Pager [phone]

## 2021-06-13 NOTE — TOC Progression Note (Signed)
Transition of Care [facility]) - Progression Note    Patient Details  Name: Vashaun Osmon MRN: [id number] Date of Birth: 1966/05/28  Transition of Care [facility]) CM/SW [place], [place] Phone Number: 06/13/2021, 3:04 PM  Clinical Narrative:   Brother came to visit.  We spoke about applying for disability. He has not done it.  States he is only off early today due to heat; is not able to get over to social security office to apply and cannot apply on-line as he has no wi fi connection.  He really wants his brother to get into [facility] if possible.  Will contact financial navigator to see if it is possible for [place] to help him apply for disability for brother. He also stated he does not know what stage cancer his brother has, and is unsure of course of treatment.  Will reach out to oncology to see if they will give him an update. TOC will continue to follow during the course of hospitalization.     Expected Discharge Plan: Skilled Nursing Facility Barriers to Discharge: SNF Pending bed offer  Expected Discharge Plan and Services Expected Discharge Plan: [place]   Discharge Planning Services: CM Consult Post Acute Care Choice: [place] Living arrangements for the past 2 months: Single Family Home                                       Social Determinants of Health (SDOH) Interventions    Readmission Risk Interventions No flowsheet data found.

## 2021-06-13 NOTE — Progress Notes (Signed)
PROGRESS NOTE    Dylan Shaw  XBW:[id number] DOB: [birth year] DOA: 05/24/2021 PCP: Patient, No Pcp Per (Inactive)   Brief Narrative:    Patient is a 55 years old male with history of cigarette smoking presented to hospital with significant weight loss fatigue low back pain.  He was found to have a lung cancer with metastasis to the spine.  He also developed urinary and bowel incontinence.  Neurosurgery was consulted and surgical tube compression was performed.  Subsequently, oncology and radiation oncology were consulted.  Surgical pathology showed poorly differentiated lung adenocarcinoma.  Patient had prolonged hospital course complicated by encephalopathy intermittent agitation.  Oncology recommended hospice versus palliative whole brain radiation.  Family was interested in proceeding with whole brain radiation and palliative chemotherapy.  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   Oncology/radiation oncology, Pulmonology, neurosurgery were all consulted.  Patient is undergoing radiation treatment.  Patient underwent laminectomy, thoracolumbar decompression and fusion with resection of mass on 05/25/2021 by neurosurgery.  MRI also showed metastatic brain lesions.  Continue Decadron and Keppra.  Palliative care was also consulted.  Family wishes to continue with palliative radiation and chemotherapy as outpatient.  Metabolic encephalopathy secondary to brain mets, mass-effect, steroid use, seizure with postictal phase.   Improved.  Try to avoid Haldol due to concern for seizures.    Leukocytosis  Mild.  No concern for infection.  On steroids.  Aphasia/focal seizure     -EEG was negative for seizure.  Neurology was consulted.  On Keppra at this time.  Has  improved at this time,  no further seizures.  Code stroke was called on 05/26/2021.  Negative CT for stroke.    Severe protein calorie moderation Severe temporal wasting.  BMI 15.9.  Continue dietary supplements.     Tobacco use    Not on any patch at this time.   Normocytic anemia Likely secondary to malignancy.  Debility/deconditioning     - PT/OT recommending skilled nursing facility on discharge.    DVT prophylaxis: heparin subcu  Code Status: DNR  Family Communication:  None  Status is: Inpatient  Remains inpatient appropriate because:Unsafe d/c plan, awaiting for skilled nursing facility placement.  Dispo: The patient is from: Home              Anticipated d/c is to: SNF              Patient currently is medically stable to d/c.   Difficult to place patient Yes   Consultants:  Neurosurgery [facility]  Procedures:  XRT Laminectomy, decompressive spine surgery.  Antimicrobials:  None currently   Subjective: Today, patient was seen and examined with her.  Denies interval complaints.  Denies any pain, nausea, vomiting  Objective: Vitals:   06/12/21 0412 06/12/21 1713 06/12/21 2011 06/13/21 0510  BP: (!) 144/99 139/89 122/80 123/89  Pulse: 100 78 100 93  Resp: 18 20 18 18   Temp: 97.7 F (36.5 C) 98.3 F (36.8 C) 98 F (36.7 C) 97.7 F (36.5 C)  TempSrc: Oral Oral  Oral  SpO2: 96% 98% 100% 100%  Weight:      Height:        Intake/Output Summary (Last 24 hours) at 06/13/2021 0945 Last data filed at 06/12/2021 2018 Gross per  24 hour  Intake 354 ml  Output 350 ml  Net 4 ml    Filed Weights   05/24/21 2208 05/25/21 0601 05/25/21 1737  Weight: 46.1 kg 46.1 kg 46.1 kg    Physical examination:  General: Thinly built, not in obvious distress, bitemporal wasting HENT:   No scleral pallor or icterus noted. Oral mucosa is moist.  Chest:  Clear breath sounds.  Diminished breath sounds bilaterally. No crackles or wheezes.  CVS: S1 &S2  heard. No murmur.  Regular rate and rhythm. Abdomen: Soft, nontender, nondistended.  Bowel sounds are heard.   Extremities: No cyanosis, clubbing or edema.  Peripheral pulses are palpable. Psych: Alert, awake and oriented, normal mood CNS:  No cranial nerve deficits.  Power equal in all extremities.  thin extremities Skin: Warm and dry.  No rashes noted.     Data Reviewed: I have personally reviewed following labs and imaging studies.  CBC: Recent Labs  Lab 06/07/21 0529 06/10/21 0656  WBC 17.2* 13.4*  NEUTROABS 14.8* 11.0*  HGB 10.8* 10.6*  HCT 34.9* 33.1*  MCV 90.9 90.4  PLT 494* 492*    Basic Metabolic Panel: Recent Labs  Lab 06/07/21 0529 06/10/21 0656  NA 135 137  K 4.2 4.4  CL 98 100  CO2 28 29  GLUCOSE 112* 88  BUN 26* 19  CREATININE 0.53* 0.56*  CALCIUM 8.9 9.0  MG 2.0 2.0  PHOS  --  3.5    GFR: Estimated Creatinine Clearance: 69.6 mL/min (A) (by C-G formula based on SCr of 0.56 mg/dL (L)). Liver Function Tests: Recent Labs  Lab 06/07/21 0529 06/10/21 0656  AST 17  --   ALT 33  --   ALKPHOS 109  --   BILITOT 0.5  --   PROT 6.9  --   ALBUMIN 2.8* 2.5*    No results for input(s): LIPASE, AMYLASE in the last 168 hours. No results for input(s): AMMONIA in the last 168 hours. Coagulation Profile: No results for input(s): INR, PROTIME in the last 168 hours. Cardiac Enzymes: No results for input(s): CKTOTAL, CKMB, CKMBINDEX, TROPONINI in the last 168 hours. BNP (last 3 results) No results for input(s): PROBNP in the last 8760 hours. HbA1C: No results for input(s): HGBA1C in the last 72 hours. CBG: Recent Labs  Lab 06/11/21 1630 06/12/21 0623 06/12/21 1133 06/12/21 1635 06/13/21 0636  GLUCAP 98 103* 121* 115* 102*    Lipid Profile: No results for input(s): CHOL, HDL, LDLCALC, TRIG, CHOLHDL, LDLDIRECT in the last 72 hours. Thyroid Function Tests: No results for input(s): TSH, T4TOTAL, FREET4, T3FREE, THYROIDAB in the last 72  hours. Anemia Panel: No results for input(s): VITAMINB12, FOLATE, FERRITIN, TIBC, IRON, RETICCTPCT in the last 72 hours. Sepsis Labs: No results for input(s): PROCALCITON, LATICACIDVEN in the last 168 hours.  No results found for this or any previous visit (from the past 240 hour(s)).    Radiology Studies: No results found.   Scheduled Meds:  (feeding supplement) PROSource Plus  30 mL Oral BID BM   dexamethasone  4 mg Oral Q12H   docusate sodium  100 mg Oral BID   feeding supplement  237 mL Oral TID BM   heparin  5,000 Units Subcutaneous Q8H   levETIRAcetam  500 mg Oral BID   pantoprazole  40 mg Oral QHS   sodium chloride flush  3 mL Intravenous Q12H   Continuous Infusions:  methocarbamol (ROBAXIN) IV       LOS: 20 days    Dylan Shaw  Benedetta Sundstrom, MD [facility]  If 7PM-7AM, please contact night-coverage [email or web address] 06/13/2021, 9:45 AM

## 2021-06-14 ENCOUNTER — Encounter: Payer: Self-pay | Admitting: Radiation Oncology

## 2021-06-14 ENCOUNTER — Ambulatory Visit
Admit: 2021-06-14 | Discharge: 2021-06-14 | Disposition: A | Discharge status: Home / Self Care | Payer: 59 | Attending: Radiation Oncology | Admitting: Radiation Oncology

## 2021-06-14 DIAGNOSIS — C3431 Malignant neoplasm of lower lobe, right bronchus or lung: Secondary | ICD-10-CM | POA: Diagnosis not present

## 2021-06-14 DIAGNOSIS — C7951 Secondary malignant neoplasm of bone: Secondary | ICD-10-CM | POA: Diagnosis not present

## 2021-06-14 DIAGNOSIS — E43 Unspecified severe protein-calorie malnutrition: Secondary | ICD-10-CM | POA: Diagnosis not present

## 2021-06-14 DIAGNOSIS — C7931 Secondary malignant neoplasm of brain: Secondary | ICD-10-CM | POA: Diagnosis not present

## 2021-06-14 LAB — GLUCOSE, CAPILLARY
Glucose-Capillary: 97 mg/dL (ref 70–99)
Glucose-Capillary: 168 mg/dL — ABNORMAL HIGH (ref 70–99)
Glucose-Capillary: 133 mg/dL — ABNORMAL HIGH (ref 70–99)
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
## 2021-06-14 NOTE — TOC Progression Note (Signed)
Transition of Care [facility]) - Progression Note    Patient Details  Name: [name] MRN: [id number] Date of Birth: 1966-12-13  [facility] CM/SW Contact  Lennart Pall, LCSW Phone Number: 06/14/2021, 12:33 PM  Clinical Narrative:    Alerted today that pt will complete his radiation txs this afternoon.  Resubmitted pt information to area SNFs who have not responded to update that no further radiation planned.  Financial counseling is sending Medicaid application to pt's brother via mail.  TOC will continue to follow and pursue SNF bed.     Expected Discharge Plan: Skilled Nursing Facility Barriers to Discharge: SNF Pending bed offer  Expected Discharge Plan and Services Expected Discharge Plan: [place]   Discharge Planning Services: CM Consult Post Acute Care Choice: [place] Living arrangements for the past 2 months: Single Family Home                                       Social Determinants of Health (SDOH) Interventions    Readmission Risk Interventions No flowsheet data found.

## 2021-06-14 NOTE — Progress Notes (Signed)
PROGRESS NOTE    Dylan Shaw  EVO:[id number] DOB: 12-07-1966 DOA: 05/24/2021 PCP: Patient, No Pcp Per (Inactive)   Brief Narrative:    55 years old male with history of cigarette smoking presented to hospital with significant weight loss fatigue low back pain.  He was found to have a lung cancer with metastasis to the spine.  He also developed urinary and bowel incontinence.  Neurosurgery was consulted and surgical decompression was performed.  Subsequently, oncology and radiation oncology were consulted.  Surgical pathology showed poorly differentiated lung adenocarcinoma.  Patient had prolonged hospital course complicated by encephalopathy and  intermittent agitation.  Oncology recommended hospice versus palliative whole brain radiation.  Family was interested in proceeding with whole brain radiation and palliative chemotherapy.  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   Oncology/radiation oncology, Pulmonology, neurosurgery were all consulted.  Patient is undergoing radiation treatment.  Patient underwent laminectomy, thoracolumbar decompression and fusion with resection of mass on 05/25/2021 by neurosurgery.  MRI also showed metastatic brain lesions.   -Continue Decadron and Keppra.   -Palliative care was also consulted.  Family wishes to continue with palliative radiation and chemotherapy as outpatient.  Metabolic encephalopathy secondary to brain mets, mass-effect, steroid use, seizure with postictal phase.  -Improved.  Try to avoid Haldol due to concern for seizures.    Leukocytosis  Mild.  No concern for infection.  On steroids.  Aphasia/focal seizure     -EEG was negative for seizure.  Neurology was consulted.  On Keppra at this time.  Has improved at  this time,  no further seizures.  Code stroke was called on 05/26/2021.  Negative CT for stroke.    Severe protein calorie moderation Severe temporal wasting.  BMI 15.9.  Continue dietary supplements.     Tobacco use    Not on any patch at this time.   Normocytic anemia Likely secondary to malignancy.  Debility/deconditioning     - PT/OT recommending skilled nursing facility on discharge.    DVT prophylaxis: heparin subcu  Code Status: DNR  Family Communication:  None  Status is: Inpatient  Remains inpatient appropriate because:Unsafe d/c plan, awaiting for skilled nursing facility placement.  Dispo: The patient is from: Home              Anticipated d/c is to: SNF              Patient currently is medically stable to d/c.   Difficult to place patient Yes   Consultants:  Neurosurgery Med [place] [facility] Palliative care Critical care  Procedures:  XRT Laminectomy, decompressive spine surgery.  Antimicrobials:  None currently   Subjective: Patient seen and examined at bedside.  Poor historian.  No overnight fever, vomiting, seizures reported. Objective: Vitals:   06/13/21 0510 06/13/21 1327 06/13/21 2013 06/14/21 0648  BP: 123/89 119/81 (!) 112/92 115/86  Pulse: 93 96 (!) 105 97  Resp: 18 (!) 24 18 16   Temp: 97.7 F (36.5 C) 98.4 F (36.9 C) (!) 97.3 F (36.3 C) (!) 97.4 F (36.3 C)  TempSrc: Oral Oral Oral   SpO2: 100% 98% 100% 97%  Weight:      Height:        Intake/Output Summary (Last 24 hours) at 06/14/2021 1041 Last data filed at  06/14/2021 0957 Gross per 24 hour  Intake 1535 ml  Output --  Net 1535 ml    Filed Weights   05/24/21 2208 05/25/21 0601 05/25/21 1737  Weight: 46.1 kg 46.1 kg 46.1 kg    Physical examination:  General: Looks extremely deconditioned, chronically ill.  No distress.  Currently on room air.   Respiratory: Bilateral decreased breath sounds at bases, no wheezing CVS: Rate controlled, S1-S2 heard Abdomen:  Soft, nontender; mildly distended.  Normal bowel sounds heard. Extremities: No clubbing or edema noted.      Data Reviewed: I have personally reviewed following labs and imaging studies.  CBC: Recent Labs  Lab 06/10/21 0656  WBC 13.4*  NEUTROABS 11.0*  HGB 10.6*  HCT 33.1*  MCV 90.4  PLT 492*    Basic Metabolic Panel: Recent Labs  Lab 06/10/21 0656  NA 137  K 4.4  CL 100  CO2 29  GLUCOSE 88  BUN 19  CREATININE 0.56*  CALCIUM 9.0  MG 2.0  PHOS 3.5    GFR: Estimated Creatinine Clearance: 69.6 mL/min (A) (by C-G formula based on SCr of 0.56 mg/dL (L)). Liver Function Tests: Recent Labs  Lab 06/10/21 0656  ALBUMIN 2.5*    No results for input(s): LIPASE, AMYLASE in the last 168 hours. No results for input(s): AMMONIA in the last 168 hours. Coagulation Profile: No results for input(s): INR, PROTIME in the last 168 hours. Cardiac Enzymes: No results for input(s): CKTOTAL, CKMB, CKMBINDEX, TROPONINI in the last 168 hours. BNP (last 3 results) No results for input(s): PROBNP in the last 8760 hours. HbA1C: No results for input(s): HGBA1C in the last 72 hours. CBG: Recent Labs  Lab 06/12/21 1635 06/13/21 0636 06/13/21 1154 06/13/21 1633 06/14/21 0657  GLUCAP 115* 102* 107* 104* 97    Lipid Profile: No results for input(s): CHOL, HDL, LDLCALC, TRIG, CHOLHDL, LDLDIRECT in the last 72 hours. Thyroid Function Tests: No results for input(s): TSH, T4TOTAL, FREET4, T3FREE, THYROIDAB in the last 72 hours. Anemia Panel: No results for input(s): VITAMINB12, FOLATE, FERRITIN, TIBC, IRON, RETICCTPCT in the last 72 hours. Sepsis Labs: No results for input(s): PROCALCITON, LATICACIDVEN in the last 168 hours.  No results found for this or any previous visit (from the past 240 hour(s)).    Radiology Studies: No results found.   Scheduled Meds:  (feeding supplement) PROSource Plus  30 mL Oral BID BM   dexamethasone  4 mg Oral Q12H   docusate sodium  100 mg  Oral BID   feeding supplement  237 mL Oral TID BM   heparin  5,000 Units Subcutaneous Q8H   levETIRAcetam  500 mg Oral BID   pantoprazole  40 mg Oral QHS   sodium chloride flush  3 mL Intravenous Q12H   Continuous Infusions:  methocarbamol (ROBAXIN) IV       LOS: 21 days    Aline August, MD [facility]  If 7PM-7AM, please contact night-coverage [email or web address] 06/14/2021, 10:41 AM

## 2021-06-15 DIAGNOSIS — C7931 Secondary malignant neoplasm of brain: Secondary | ICD-10-CM | POA: Diagnosis not present

## 2021-06-15 DIAGNOSIS — C7951 Secondary malignant neoplasm of bone: Secondary | ICD-10-CM | POA: Diagnosis not present

## 2021-06-15 DIAGNOSIS — E43 Unspecified severe protein-calorie malnutrition: Secondary | ICD-10-CM | POA: Diagnosis not present

## 2021-06-15 DIAGNOSIS — C3431 Malignant neoplasm of lower lobe, right bronchus or lung: Secondary | ICD-10-CM | POA: Diagnosis not present

## 2021-06-15 LAB — GLUCOSE, CAPILLARY
Glucose-Capillary: 107 mg/dL — ABNORMAL HIGH (ref 70–99)
Glucose-Capillary: 167 mg/dL — ABNORMAL HIGH (ref 70–99)
Glucose-Capillary: 91 mg/dL (ref 70–99)
Glucose-Capillary: 126 mg/dL — ABNORMAL HIGH (ref 70–99)

## 2021-06-15 NOTE — Progress Notes (Signed)
   06/15/21 1341  Assess: MEWS Score  Temp 99.2 F (37.3 C)  BP 112/76  Pulse Rate (!) 126  Resp 20  SpO2 96 %  Assess: MEWS Score  MEWS Temp 0  MEWS Systolic 0  MEWS Pulse 2  MEWS RR 0  MEWS LOC 0  MEWS Score 2  MEWS Score Color Yellow  Assess: if the MEWS score is Yellow or Red  Were vital signs taken at a resting state? Yes  Focused Assessment No change from prior assessment  Does the patient meet 2 or more of the SIRS criteria? No  Does the patient have a confirmed or suspected source of infection? No  Provider and Rapid Response Notified? No  MEWS guidelines implemented *See Row Information* Yes  Take Vital Signs  Increase Vital Sign Frequency  Yellow: Q 2hr X 2 then Q 4hr X 2, if remains yellow, continue Q 4hrs  Escalate  MEWS: Escalate Yellow: discuss with charge nurse/RN and consider discussing with provider and RRT  Notify: Charge Nurse/RN  Name of Charge Nurse/RN Notified Abigail Butts RN  Date Charge Nurse/RN Notified 06/15/21  Time Charge Nurse/RN Notified 1345  Document  Patient Outcome Stabilized after interventions  Progress note created (see row info) Yes  Assess: SIRS CRITERIA  SIRS Temperature  0  SIRS Pulse 1  SIRS Respirations  0  SIRS WBC 0  SIRS Score Sum  1

## 2021-06-15 NOTE — Progress Notes (Signed)
PROGRESS NOTE    Dylan Shaw  IOM:[id number] DOB: 1966-03-19 DOA: 05/24/2021 PCP: Patient, No Pcp Per (Inactive)   Brief Narrative:    55 years old male with history of cigarette smoking presented to hospital with significant weight loss fatigue low back pain.  He was found to have a lung cancer with metastasis to the spine.  He also developed urinary and bowel incontinence.  Neurosurgery was consulted and surgical decompression was performed.  Subsequently, oncology and radiation oncology were consulted.  Surgical pathology showed poorly differentiated lung adenocarcinoma.  Patient had prolonged hospital course complicated by encephalopathy and  intermittent agitation.  Oncology recommended hospice versus palliative whole brain radiation.  Family was interested in proceeding with whole brain radiation and palliative chemotherapy.  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   Oncology/radiation oncology, Pulmonology, neurosurgery were all consulted.  Patient is undergoing radiation treatment.  Patient underwent laminectomy, thoracolumbar decompression and fusion with resection of mass on 05/25/2021 by neurosurgery.  MRI also showed metastatic brain lesions.   -Continue Decadron and Keppra.   -Palliative care was also consulted.  Family wishes to continue with palliative radiation and chemotherapy as outpatient. -Oncology following.  Metabolic encephalopathy secondary to brain mets, mass-effect, steroid use, seizure with postictal phase.  -Improved.  Try to avoid Haldol due to concern for seizures.    Leukocytosis  Mild.  No concern for infection.  On steroids.  Aphasia/focal seizure     -EEG was negative for seizure.  Neurology was consulted.  On Keppra at this  time.  Has improved at this time,  no further seizures.  Code stroke was called on 05/26/2021.  Negative CT for stroke.    Severe protein calorie moderation Severe temporal wasting.  BMI 15.9.  Continue dietary supplements.     Tobacco use    Not on any patch at this time.   Normocytic anemia Likely secondary to malignancy.  Debility/deconditioning     - PT/OT recommending skilled nursing facility on discharge.    DVT prophylaxis: heparin subcu  Code Status: DNR  Family Communication:  None  Status is: Inpatient  Remains inpatient appropriate because:Unsafe d/c plan, awaiting for skilled nursing facility placement.  Dispo: The patient is from: Home              Anticipated d/c is to: SNF              Patient currently is medically stable to d/c.   Difficult to place patient Yes   Consultants:  Neurosurgery Med [place] [facility] Palliative care Critical care  Procedures:  XRT Laminectomy, decompressive spine surgery.  Antimicrobials:  None currently   Subjective: Patient seen and examined at bedside.  Poor historian.  No overnight seizures, vomiting or fevers reported by nursing staff.   Objective: Vitals:   06/14/21 1232 06/14/21 1436 06/14/21 1948 06/15/21 0322  BP: 109/83 116/85 108/71 108/74  Pulse: (!) 102 (!) 105 99 (!) 109  Resp: 17 20 18 18   Temp: 98 F (36.7 C) (!) 97.2 F (36.2 C) 98.3 F (36.8 C) 98.2 F (36.8 C)  TempSrc: Oral Oral Oral Oral  SpO2: 98% 99% 100% 94%  Weight:      Height:        Intake/Output Summary (Last 24 hours)  at 06/15/2021 0832 Last data filed at 06/14/2021 1900 Gross per 24 hour  Intake 590 ml  Output --  Net 590 ml    Filed Weights   05/24/21 2208 05/25/21 0601 05/25/21 1737  Weight: 46.1 kg 46.1 kg 46.1 kg    Physical examination:  General: Looks extremely deconditioned, chronically ill.  On room air currently.  No acute distress.  Poor historian.   Respiratory: Decreased breath sounds at bases  bilaterally with some scattered crackles  CVS: S1-S2 heard; intermittently tachycardic  abdomen: Soft, nontender; mildly distended.  Bowel sounds are heard extremities: No edema or cyanosis    Data Reviewed: I have personally reviewed following labs and imaging studies.  CBC: Recent Labs  Lab 06/10/21 0656  WBC 13.4*  NEUTROABS 11.0*  HGB 10.6*  HCT 33.1*  MCV 90.4  PLT 492*    Basic Metabolic Panel: Recent Labs  Lab 06/10/21 0656  NA 137  K 4.4  CL 100  CO2 29  GLUCOSE 88  BUN 19  CREATININE 0.56*  CALCIUM 9.0  MG 2.0  PHOS 3.5    GFR: Estimated Creatinine Clearance: 69.6 mL/min (A) (by C-G formula based on SCr of 0.56 mg/dL (L)). Liver Function Tests: Recent Labs  Lab 06/10/21 0656  ALBUMIN 2.5*    No results for input(s): LIPASE, AMYLASE in the last 168 hours. No results for input(s): AMMONIA in the last 168 hours. Coagulation Profile: No results for input(s): INR, PROTIME in the last 168 hours. Cardiac Enzymes: No results for input(s): CKTOTAL, CKMB, CKMBINDEX, TROPONINI in the last 168 hours. BNP (last 3 results) No results for input(s): PROBNP in the last 8760 hours. HbA1C: No results for input(s): HGBA1C in the last 72 hours. CBG: Recent Labs  Lab 06/13/21 1633 06/14/21 0657 06/14/21 1102 06/14/21 1657 06/15/21 0604  GLUCAP 104* 97 168* 133* 107*    Lipid Profile: No results for input(s): CHOL, HDL, LDLCALC, TRIG, CHOLHDL, LDLDIRECT in the last 72 hours. Thyroid Function Tests: No results for input(s): TSH, T4TOTAL, FREET4, T3FREE, THYROIDAB in the last 72 hours. Anemia Panel: No results for input(s): VITAMINB12, FOLATE, FERRITIN, TIBC, IRON, RETICCTPCT in the last 72 hours. Sepsis Labs: No results for input(s): PROCALCITON, LATICACIDVEN in the last 168 hours.  No results found for this or any previous visit (from the past 240 hour(s)).    Radiology Studies: No results found.   Scheduled Meds:  (feeding supplement) PROSource  Plus  30 mL Oral BID BM   dexamethasone  4 mg Oral Q12H   docusate sodium  100 mg Oral BID   feeding supplement  237 mL Oral TID BM   heparin  5,000 Units Subcutaneous Q8H   levETIRAcetam  500 mg Oral BID   pantoprazole  40 mg Oral QHS   sodium chloride flush  3 mL Intravenous Q12H   Continuous Infusions:  methocarbamol (ROBAXIN) IV       LOS: 22 days    Benjy Kana Starla Link, MD [facility]  If 7PM-7AM, please contact night-coverage [email or web address] 06/15/2021, 8:32 AM

## 2021-06-16 DIAGNOSIS — E43 Unspecified severe protein-calorie malnutrition: Secondary | ICD-10-CM | POA: Diagnosis not present

## 2021-06-16 DIAGNOSIS — C7931 Secondary malignant neoplasm of brain: Secondary | ICD-10-CM | POA: Diagnosis not present

## 2021-06-16 DIAGNOSIS — C7951 Secondary malignant neoplasm of bone: Secondary | ICD-10-CM | POA: Diagnosis not present

## 2021-06-16 DIAGNOSIS — C3431 Malignant neoplasm of lower lobe, right bronchus or lung: Secondary | ICD-10-CM | POA: Diagnosis not present

## 2021-06-16 LAB — GLUCOSE, CAPILLARY
Glucose-Capillary: 106 mg/dL — ABNORMAL HIGH (ref 70–99)
Glucose-Capillary: 69 mg/dL — ABNORMAL LOW (ref 70–99)
Glucose-Capillary: 123 mg/dL — ABNORMAL HIGH (ref 70–99)
Glucose-Capillary: 103 mg/dL — ABNORMAL HIGH (ref 70–99)

## 2021-06-16 NOTE — Progress Notes (Signed)
PROGRESS NOTE    Dylan Shaw  RWE:[id number] DOB: 03/13/1966 DOA: 05/24/2021 PCP: Patient, No Pcp Per (Inactive)   Brief Narrative:    55 years old male with history of cigarette smoking presented to hospital with significant weight loss fatigue low back pain.  He was found to have a lung cancer with metastasis to the spine.  He also developed urinary and bowel incontinence.  Neurosurgery was consulted and surgical decompression was performed.  Subsequently, oncology and radiation oncology were consulted.  Surgical pathology showed poorly differentiated lung adenocarcinoma.  Patient had prolonged hospital course complicated by encephalopathy and  intermittent agitation.  Oncology recommended hospice versus palliative whole brain radiation.  Family was interested in proceeding with whole brain radiation and palliative chemotherapy.  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   Oncology/radiation oncology, Pulmonology, neurosurgery were all consulted.  Patient is undergoing radiation treatment.  Patient underwent laminectomy, thoracolumbar decompression and fusion with resection of mass on 05/25/2021 by neurosurgery.  MRI also showed metastatic brain lesions.   -Continue Decadron and Keppra.   -Palliative care was also consulted.  Family wishes to continue with palliative radiation and chemotherapy as outpatient. -Oncology following.  Metabolic encephalopathy secondary to brain mets, mass-effect, steroid use, seizure with postictal phase.  -Improved.  Try to avoid Haldol due to concern for seizures.    Leukocytosis  Mild.  No concern for infection.  On steroids.  Aphasia/focal seizure     -EEG was negative for seizure.  Neurology was consulted.  On Keppra at this  time.  Has improved at this time,  no further seizures.  Code stroke was called on 05/26/2021.  Negative CT for stroke.    Severe protein calorie moderation Severe temporal wasting.  BMI 15.9.  Continue dietary supplements.     Tobacco use    Not on any patch at this time.   Normocytic anemia Likely secondary to malignancy.  Debility/deconditioning     - PT/OT recommending skilled nursing facility on discharge.    DVT prophylaxis: heparin subcu  Code Status: DNR  Family Communication:  None  Status is: Inpatient  Remains inpatient appropriate because:Unsafe d/c plan, awaiting for skilled nursing facility placement.  Dispo: The patient is from: Home              Anticipated d/c is to: SNF              Patient currently is medically stable to d/c.   Difficult to place patient Yes   Consultants:  Neurosurgery Med [place] [facility] Palliative care Critical care  Procedures:  XRT Laminectomy, decompressive spine surgery.  Antimicrobials:  None currently   Subjective: Patient seen and examined at bedside.  Poor historian.  Patient has no new complaints.  No fever or vomiting reported. objective: Vitals:   06/15/21 2054 06/15/21 2140 06/16/21 0148 06/16/21 0510  BP: 121/84 117/84 114/65 114/77  Pulse: 90 97 94 92  Resp: 16 16 16 16   Temp: 98 F (36.7 C) 98.5 F (36.9 C) 98.2 F (36.8 C) 98 F (36.7 C)  TempSrc: Oral Oral Oral Oral  SpO2: 96% 97% 96% 98%  Weight:      Height:        Intake/Output Summary (Last 24 hours) at 06/16/2021 0847 Last data  filed at 06/15/2021 1932 Gross per 24 hour  Intake 1164 ml  Output --  Net 1164 ml    Filed Weights   05/24/21 2208 05/25/21 0601 05/25/21 1737  Weight: 46.1 kg 46.1 kg 46.1 kg    Physical examination:  General: Currently on room air.  No distress.  Looks extremely deconditioned, chronically ill.   Poor historian.  Looks cachectic Respiratory: Bilateral decreased breath sounds at bases with  scattered crackles CVS: Rate controlled, S1-S2 heard abdomen: Soft, nontender; distended slightly.  Normal bowel sounds heard extremities: No clubbing or edema  Data Reviewed: I have personally reviewed following labs and imaging studies.  CBC: Recent Labs  Lab 06/10/21 0656  WBC 13.4*  NEUTROABS 11.0*  HGB 10.6*  HCT 33.1*  MCV 90.4  PLT 492*    Basic Metabolic Panel: Recent Labs  Lab 06/10/21 0656  NA 137  K 4.4  CL 100  CO2 29  GLUCOSE 88  BUN 19  CREATININE 0.56*  CALCIUM 9.0  MG 2.0  PHOS 3.5    GFR: Estimated Creatinine Clearance: 69.6 mL/min (A) (by C-G formula based on SCr of 0.56 mg/dL (L)). Liver Function Tests: Recent Labs  Lab 06/10/21 0656  ALBUMIN 2.5*    No results for input(s): LIPASE, AMYLASE in the last 168 hours. No results for input(s): AMMONIA in the last 168 hours. Coagulation Profile: No results for input(s): INR, PROTIME in the last 168 hours. Cardiac Enzymes: No results for input(s): CKTOTAL, CKMB, CKMBINDEX, TROPONINI in the last 168 hours. BNP (last 3 results) No results for input(s): PROBNP in the last 8760 hours. HbA1C: No results for input(s): HGBA1C in the last 72 hours. CBG: Recent Labs  Lab 06/15/21 0604 06/15/21 1047 06/15/21 1659 06/15/21 2017 06/16/21 0720  GLUCAP 107* 167* 91 126* 106*    Lipid Profile: No results for input(s): CHOL, HDL, LDLCALC, TRIG, CHOLHDL, LDLDIRECT in the last 72 hours. Thyroid Function Tests: No results for input(s): TSH, T4TOTAL, FREET4, T3FREE, THYROIDAB in the last 72 hours. Anemia Panel: No results for input(s): VITAMINB12, FOLATE, FERRITIN, TIBC, IRON, RETICCTPCT in the last 72 hours. Sepsis Labs: No results for input(s): PROCALCITON, LATICACIDVEN in the last 168 hours.  No results found for this or any previous visit (from the past 240 hour(s)).    Radiology Studies: No results found.   Scheduled Meds:  (feeding supplement) PROSource Plus  30 mL Oral BID BM    dexamethasone  4 mg Oral Q12H   docusate sodium  100 mg Oral BID   feeding supplement  237 mL Oral TID BM   heparin  5,000 Units Subcutaneous Q8H   levETIRAcetam  500 mg Oral BID   pantoprazole  40 mg Oral QHS   sodium chloride flush  3 mL Intravenous Q12H   Continuous Infusions:  methocarbamol (ROBAXIN) IV       LOS: 23 days    Ronnel Zuercher Starla Link, MD [facility]  If 7PM-7AM, please contact night-coverage [email or web address] 06/16/2021, 8:47 AM

## 2021-06-17 DIAGNOSIS — C7931 Secondary malignant neoplasm of brain: Secondary | ICD-10-CM | POA: Diagnosis not present

## 2021-06-17 DIAGNOSIS — C3431 Malignant neoplasm of lower lobe, right bronchus or lung: Secondary | ICD-10-CM | POA: Diagnosis not present

## 2021-06-17 LAB — GLUCOSE, CAPILLARY
Glucose-Capillary: 201 mg/dL — ABNORMAL HIGH (ref 70–99)
Glucose-Capillary: 170 mg/dL — ABNORMAL HIGH (ref 70–99)
Glucose-Capillary: 92 mg/dL (ref 70–99)

## 2021-06-17 NOTE — Progress Notes (Signed)
PROGRESS NOTE    Dylan Shaw  TLX:[id number] DOB: [birth year] DOA: 05/24/2021 PCP: Patient, No Pcp Per (Inactive)   Brief Narrative:    55 years old male with history of cigarette smoking presented to hospital with significant weight loss fatigue low back pain.  He was found to have a lung cancer with metastasis to the spine.  He also developed urinary and bowel incontinence.  Neurosurgery was consulted and surgical decompression was performed.  Subsequently, oncology and radiation oncology were consulted.  Surgical pathology showed poorly differentiated lung adenocarcinoma.  Patient had prolonged hospital course complicated by encephalopathy and  intermittent agitation.  Oncology recommended hospice versus palliative whole brain radiation.  Family was interested in proceeding with whole brain radiation and palliative chemotherapy.  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   Oncology/radiation oncology, Pulmonology, neurosurgery were all consulted.  Patient is undergoing radiation treatment.  Patient underwent laminectomy, thoracolumbar decompression and fusion with resection of mass on 05/25/2021 by neurosurgery.  MRI also showed metastatic brain lesions.   -Continue Decadron and Keppra.   -Palliative care was also consulted.  Family wishes to continue with palliative radiation and chemotherapy as outpatient. -Oncology following.  Metabolic encephalopathy secondary to brain mets, mass-effect, steroid use, seizure with postictal phase.  -Improved.  Try to avoid Haldol due to concern for seizures.    Leukocytosis  Mild.  No concern for infection.  On steroids.  Aphasia/focal seizure     -EEG was negative for seizure.  Neurology was consulted.  On Keppra at this  time.  Has improved at this time,  no further seizures.  Code stroke was called on 05/26/2021.  Negative CT for stroke.    Severe protein calorie moderation Severe temporal wasting.  BMI 15.9.  Continue dietary supplements.     Tobacco use    Not on any patch at this time.   Normocytic anemia Likely secondary to malignancy.  Debility/deconditioning     - PT/OT recommending skilled nursing facility on discharge.    DVT prophylaxis: heparin subcu  Code Status: DNR  Family Communication:  None  Status is: Inpatient  Remains inpatient appropriate because:Unsafe d/c plan, awaiting for skilled nursing facility placement.  Dispo: The patient is from: Home              Anticipated d/c is to: SNF              Patient currently is medically stable to d/c.   Difficult to place patient Yes   Consultants:  Neurosurgery [facility] Palliative care Critical care  Procedures:  XRT Laminectomy, decompressive spine surgery.  Antimicrobials:  None currently   Subjective: Patient seen and examined at bedside.  Poor historian.  No overnight agitation, seizures, fever or vomiting reported.   Objective: Vitals:   06/16/21 0148 06/16/21 0510 06/16/21 1310 06/17/21 0531  BP: 114/65 114/77 104/73 105/73  Pulse: 94 92 100 87  Resp: 16 16 14 14   Temp: 98.2 F (36.8 C) 98 F (36.7 C) (!) 97.5 F (36.4 C)   TempSrc: Oral Oral Oral   SpO2: 96% 98% 97% 99%  Weight:      Height:        Intake/Output Summary (Last 24 hours) at 06/17/2021 7416 Last data filed at 06/17/2021  0630 Gross per 24 hour  Intake --  Output 950 ml  Net -950 ml    Filed Weights   05/24/21 2208 05/25/21 0601 05/25/21 1737  Weight: 46.1 kg 46.1 kg 46.1 kg    Physical examination:  General: No acute distress.  On room air currently.  Looks extremely deconditioned, chronically ill.   Poor historian.  Looks cachectic Respiratory: Decreased breath sounds at bases bilaterally, no wheezing  CVS:  S1-S2 heard, rate controlled  abdomen: Soft, nontender; mildly distended.  Bowel sounds are heard extremities: No edema or clubbing  Data Reviewed: I have personally reviewed following labs and imaging studies.  CBC: No results for input(s): WBC, NEUTROABS, HGB, HCT, MCV, PLT in the last 168 hours.  Basic Metabolic Panel: No results for input(s): NA, K, CL, CO2, GLUCOSE, BUN, CREATININE, CALCIUM, MG, PHOS in the last 168 hours.  GFR: Estimated Creatinine Clearance: 69.6 mL/min (A) (by C-G formula based on SCr of 0.56 mg/dL (L)). Liver Function Tests: No results for input(s): AST, ALT, ALKPHOS, BILITOT, PROT, ALBUMIN in the last 168 hours.  No results for input(s): LIPASE, AMYLASE in the last 168 hours. No results for input(s): AMMONIA in the last 168 hours. Coagulation Profile: No results for input(s): INR, PROTIME in the last 168 hours. Cardiac Enzymes: No results for input(s): CKTOTAL, CKMB, CKMBINDEX, TROPONINI in the last 168 hours. BNP (last 3 results) No results for input(s): PROBNP in the last 8760 hours. HbA1C: No results for input(s): HGBA1C in the last 72 hours. CBG: Recent Labs  Lab 06/15/21 2017 06/16/21 0720 06/16/21 1211 06/16/21 1654 06/16/21 2216  GLUCAP 126* 106* 69* 123* 103*    Lipid Profile: No results for input(s): CHOL, HDL, LDLCALC, TRIG, CHOLHDL, LDLDIRECT in the last 72 hours. Thyroid Function Tests: No results for input(s): TSH, T4TOTAL, FREET4, T3FREE, THYROIDAB in the last 72 hours. Anemia Panel: No results for input(s): VITAMINB12, FOLATE, FERRITIN, TIBC, IRON, RETICCTPCT in the last 72 hours. Sepsis Labs: No results for input(s): PROCALCITON, LATICACIDVEN in the last 168 hours.  No results found for this or any previous visit (from the past 240 hour(s)).    Radiology Studies: No results found.   Scheduled Meds:  (feeding supplement) PROSource Plus  30 mL Oral BID BM   dexamethasone  4 mg Oral Q12H   docusate sodium  100 mg Oral BID    feeding supplement  237 mL Oral TID BM   heparin  5,000 Units Subcutaneous Q8H   levETIRAcetam  500 mg Oral BID   pantoprazole  40 mg Oral QHS   Continuous Infusions:  methocarbamol (ROBAXIN) IV       LOS: 24 days    Aline August, MD [facility]  If 7PM-7AM, please contact night-coverage [email or web address] 06/17/2021, 8:26 AM

## 2021-06-17 NOTE — TOC Progression Note (Signed)
Transition of Care [facility]) - Progression Note    Patient Details  Name: Dylan Shaw MRN: [id number] Date of Birth: 1966-01-07  Transition of Care [facility]) CM/SW Contact  Leeroy Cha, RN Phone Number: 06/17/2021, 9:52 AM  Clinical Narrative:    Spoke with the brother Herbie [place].  He is the oldest siblings and the only living relative of the immediate family.  Informed him that since he is the only living relative and the oldest that by [facility] that make him the next of kin and the POA  automatically since the patient is unable to speak for himself.  Stated that he understood this.   Expected Discharge Plan: Skilled Nursing Facility Barriers to Discharge: SNF Pending bed offer  Expected Discharge Plan and Services Expected Discharge Plan: [place]   Discharge Planning Services: CM Consult Post Acute Care Choice: [place] Living arrangements for the past 2 months: Single Family Home                                       Social Determinants of Health (SDOH) Interventions    Readmission Risk Interventions No flowsheet data found.

## 2021-06-18 DIAGNOSIS — Z515 Encounter for palliative care: Secondary | ICD-10-CM

## 2021-06-18 DIAGNOSIS — Z7189 Other specified counseling: Secondary | ICD-10-CM

## 2021-06-18 LAB — GLUCOSE, CAPILLARY
Glucose-Capillary: 154 mg/dL — ABNORMAL HIGH (ref 70–99)
Glucose-Capillary: 169 mg/dL — ABNORMAL HIGH (ref 70–99)
Glucose-Capillary: 115 mg/dL — ABNORMAL HIGH (ref 70–99)

## 2021-06-18 NOTE — Progress Notes (Signed)
Occupational Therapy Treatment Patient Details Name: Dylan Shaw MRN: [id number] DOB: 07-26-1966 Today's Date: 06/18/2021    History of present illness 55 y.o. man w/ new metastatic disease s/p thoracolumbar decompression and fusion on 05/25/21. Now w/ expressive aphasia 5/29 , self-limited, likely focal seizure. 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 smoker, 50 pound weight loss   OT comments  Patient overall min guard level for mobility out of bed due to lack of safety and poor standing tolerance, patient swearing and moaning however needs moderate repetition to find out what is wrong "my back." Patient needs significant cues throughout session to initiate tasks such as bed mobility, lower body dressing using figure 4 method, and brushing his teeth. Patient set up assist to brush teeth and min G for standing, patient leans heavily onto sink for support. Patient min G to recliner for safety, chair alarm activated. Patient will need 24/7 supervision at D/C due to safety/cognition   Follow Up Recommendations  SNF    Equipment Recommendations  3 in 1 bedside commode       Precautions / Restrictions Precautions Precautions: Back;Fall Precaution Booklet Issued: Yes (comment)       Mobility Bed Mobility Overal bed mobility: Needs Assistance   Rolling: Supervision Sidelying to sit: Min assist       General bed mobility comments: increased time and cues to initiate bed mobility    Transfers Overall transfer level: Needs assistance Equipment used: None Transfers: Sit to/from Stand;Stand Pivot Transfers Sit to Stand: Min guard Stand pivot transfers: Min guard       General transfer comment: does not want to use walker pushing it away. min G for safety as patient has poor insight and activity tolerance    Balance Overall balance assessment: Needs assistance Sitting-balance support: Feet supported Sitting balance-Leahy Scale: Good     Standing balance  support: Single extremity supported;Bilateral upper extremity supported Standing balance-Leahy Scale: Poor                             ADL either performed or assessed with clinical judgement   ADL Overall ADL's : Needs assistance/impaired     Grooming: Oral care;Wash/dry face;Min guard;Set up;Sitting;Standing Grooming Details (indicate cue type and reason): patient leans heavily onto sink to brush his teeth and moaning at times. when asked if dizzy states "A little" once oral care completed returned to sitting for safety and patient able to wash his face. note patient reaching out past sink handle when asked what he is looking for states "tooth paste" however OT already placed on brush             Lower Body Dressing: Set up;Sitting/lateral leans Lower Body Dressing Details (indicate cue type and reason): to don socks, able to perform figure 4 method with increased time and mod cues to initiate task Toilet Transfer: Min guard;Cueing for safety;Ambulation Toilet Transfer Details (indicate cue type and reason): patient first stand pivot to recliner without use of adaptive device and min G for safety due to poor activity tolerance. after seated rest break patient able to take few steps to sink then transfer back to recliner. poor activity tolerance         Functional mobility during ADLs: Min guard        Cognition Arousal/Alertness: Awake/alert Behavior During Therapy: Flat affect Overall Cognitive Status: Impaired/Different from baseline Area of Impairment: Safety/judgement;Problem solving  Safety/Judgement: Decreased awareness of safety;Decreased awareness of deficits   Problem Solving: Slow processing;Decreased initiation General Comments: needs increased time and max cues to initiate tasks will say "okay" when instructed but then does not initiate                   Pertinent Vitals/ Pain       Pain Assessment:  Faces Faces Pain Scale: Hurts even more Pain Location: back Pain Descriptors / Indicators: Moaning;Grimacing;Guarding;Other (Comment) (swearing) Pain Intervention(s): Monitored during session         Frequency  Min 2X/week        Progress Toward Goals  OT Goals(current goals can now be found in the care plan section)  Progress towards OT goals: Progressing toward goals  Acute Rehab OT Goals Patient Stated Goal: patient does not state OT Goal Formulation: Patient unable to participate in goal setting Time For Goal Achievement: 07/02/21 Potential to Achieve Goals: Fair ADL Goals Pt Will Perform Grooming: with supervision;standing Pt Will Perform Upper Body Bathing: with set-up;with supervision Pt Will Perform Lower Body Bathing: with supervision;sitting/lateral leans;sit to/from stand Pt Will Transfer to Toilet: with supervision;ambulating Pt Will Perform Toileting - Clothing Manipulation and hygiene: with supervision Pt/caregiver will Perform Home Exercise Program: With Supervision;Increased ROM;Increased strength;Both right and left upper extremity Additional ADL Goal #1: Pt will show improved mentation by increasing score on ACE III Cognitive exam to at least 20/100 and will follow 2-step commands 50% of the time.  Plan Discharge plan remains appropriate       AM-PAC OT "6 Clicks" Daily Activity     Outcome Measure   Help from another person eating meals?: A Little Help from another person taking care of personal grooming?: A Little Help from another person toileting, which includes using toliet, bedpan, or urinal?: A Little Help from another person bathing (including washing, rinsing, drying)?: A Little Help from another person to put on and taking off regular upper body clothing?: A Little Help from another person to put on and taking off regular lower body clothing?: A Little 6 Click Score: 18    End of Session Equipment Utilized During Treatment: Gait belt  OT  Visit Diagnosis: Unsteadiness on feet (R26.81);Muscle weakness (generalized) (M62.81);Cognitive communication deficit (R41.841)   Activity Tolerance Patient tolerated treatment well;Patient limited by pain   Patient Left in chair;with call bell/phone within reach;with chair alarm set   Nurse Communication Mobility status        Time: [phone] OT Time Calculation (min): 23 min  Charges: OT General Charges $OT Visit: 1 Visit OT Treatments $Self Care/Home Management : 23-37 mins  Delbert Phenix OT OT pager: [phone]   Rosemary Holms 06/18/2021, 10:24 AM

## 2021-06-18 NOTE — Progress Notes (Signed)
PROGRESS NOTE    Dylan Shaw  LNL:[id number] DOB: 25-Oct-1966 DOA: 05/24/2021 PCP: Patient, No Pcp Per (Inactive)   Brief Narrative:    54 years old male with history of cigarette smoking presented to hospital with significant weight loss fatigue low back pain.  He was found to have a lung cancer with metastasis to the spine.  He also developed urinary and bowel incontinence.  Neurosurgery was consulted and surgical decompression was performed.  Subsequently, oncology and radiation oncology were consulted.  Surgical pathology showed poorly differentiated lung adenocarcinoma.  Patient had prolonged hospital course complicated by encephalopathy and  intermittent agitation.  Oncology recommended hospice versus palliative whole brain radiation.  Family was interested in proceeding with whole brain radiation and palliative chemotherapy.  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   Oncology/radiation oncology, Pulmonology, neurosurgery were all consulted.  Patient is undergoing radiation treatment.  Patient underwent laminectomy, thoracolumbar decompression and fusion with resection of mass on 05/25/2021 by neurosurgery.  MRI also showed metastatic brain lesions.   -Continue Decadron and Keppra.   -Palliative care was also consulted.  Family wishes to continue with palliative radiation and chemotherapy as outpatient. -Oncology following.  Metabolic encephalopathy secondary to brain mets, mass-effect, steroid use, seizure with postictal phase.  -Improved.  Try to avoid Haldol due to concern for seizures.    Leukocytosis  Mild.  No concern for infection.  On steroids.  Aphasia/focal seizure     -EEG was negative for seizure.  Neurology was consulted.  On Keppra at this  time.  Has improved at this time,  no further seizures.  Code stroke was called on 05/26/2021.  Negative CT for stroke.    Severe protein calorie moderation Severe temporal wasting.  BMI 15.9.  Continue dietary supplements.     Tobacco use    Not on any patch at this time.   Normocytic anemia Likely secondary to malignancy.  Debility/deconditioning     - PT/OT recommending skilled nursing facility on discharge.    DVT prophylaxis: heparin subcu  Code Status: DNR  Family Communication:  None  Status is: Inpatient  Remains inpatient appropriate because:Unsafe d/c plan, awaiting for skilled nursing facility placement.  Dispo: The patient is from: Home              Anticipated d/c is to: SNF              Patient currently is medically stable to d/c.   Difficult to place patient Yes   Consultants:  Neurosurgery Med [place] [facility] Palliative care Critical care  Procedures:  XRT Laminectomy, decompressive spine surgery.  Antimicrobials:  None currently   Subjective: Patient seen and examined at bedside.  Poor historian.  No overnight seizures, fever, agitation reported by nursing staff.   Objective: Vitals:   06/17/21 0531 06/17/21 1416 06/17/21 2023 06/18/21 0555  BP: 105/73 111/83 117/87 121/85  Pulse: 87 88 89 86  Resp: 14  18 18   Temp:  98.3 F (36.8 C) 98.2 F (36.8 C) 97.9 F (36.6 C)  TempSrc:  Oral    SpO2: 99% 99% 99% 98%  Weight:      Height:        Intake/Output Summary (Last 24 hours) at 06/18/2021 4081 Last data filed at 06/17/2021  2025 Gross per 24 hour  Intake 808 ml  Output 500 ml  Net 308 ml    Filed Weights   05/24/21 2208 05/25/21 0601 05/25/21 1737  Weight: 46.1 kg 46.1 kg 46.1 kg    Physical examination:  General: Currently on room air.  No distress.  Looks extremely deconditioned, chronically ill.   Poor historian.  Looks cachectic.  Respiratory: Bilateral decreased breath sounds at bases with some scattered crackles   CVS: Rate controlled, S1-S2 heard abdomen: Soft, nontender; distended slightly.  Normal bowel sounds are heard  extremities: No cyanosis; trace lower extremity edema present  Data Reviewed: I have personally reviewed following labs and imaging studies.  CBC: No results for input(s): WBC, NEUTROABS, HGB, HCT, MCV, PLT in the last 168 hours.  Basic Metabolic Panel: No results for input(s): NA, K, CL, CO2, GLUCOSE, BUN, CREATININE, CALCIUM, MG, PHOS in the last 168 hours.  GFR: Estimated Creatinine Clearance: 69.6 mL/min (A) (by C-G formula based on SCr of 0.56 mg/dL (L)). Liver Function Tests: No results for input(s): AST, ALT, ALKPHOS, BILITOT, PROT, ALBUMIN in the last 168 hours.  No results for input(s): LIPASE, AMYLASE in the last 168 hours. No results for input(s): AMMONIA in the last 168 hours. Coagulation Profile: No results for input(s): INR, PROTIME in the last 168 hours. Cardiac Enzymes: No results for input(s): CKTOTAL, CKMB, CKMBINDEX, TROPONINI in the last 168 hours. BNP (last 3 results) No results for input(s): PROBNP in the last 8760 hours. HbA1C: No results for input(s): HGBA1C in the last 72 hours. CBG: Recent Labs  Lab 06/16/21 2216 06/17/21 0856 06/17/21 1111 06/17/21 1649 06/18/21 0646  GLUCAP 103* 201* 170* 92 154*    Lipid Profile: No results for input(s): CHOL, HDL, LDLCALC, TRIG, CHOLHDL, LDLDIRECT in the last 72 hours. Thyroid Function Tests: No results for input(s): TSH, T4TOTAL, FREET4, T3FREE, THYROIDAB in the last 72 hours. Anemia Panel: No results for input(s): VITAMINB12, FOLATE, FERRITIN, TIBC, IRON, RETICCTPCT in the last 72 hours. Sepsis Labs: No results for input(s): PROCALCITON, LATICACIDVEN in the last 168 hours.  No results found for this or any previous visit (from the past 240 hour(s)).    Radiology Studies: No results found.   Scheduled Meds:  (feeding supplement) PROSource Plus  30 mL Oral BID BM   dexamethasone  4 mg  Oral Q12H   docusate sodium  100 mg Oral BID   feeding supplement  237 mL Oral TID BM   heparin  5,000 Units Subcutaneous Q8H   levETIRAcetam  500 mg Oral BID   pantoprazole  40 mg Oral QHS   Continuous Infusions:  methocarbamol (ROBAXIN) IV       LOS: 25 days    Aline August, MD [facility]  If 7PM-7AM, please contact night-coverage [email or web address] 06/18/2021, 8:24 AM

## 2021-06-18 NOTE — Progress Notes (Signed)
Daily Progress Note   Patient Name: Dylan Shaw       Date: 06/18/2021 DOB: Apr 06, 1966  Age: 55 y.o. MRN#: [id number] Attending Physician: Dylan August, MD Primary Care Physician: Patient, No Pcp Per (Inactive) Admit Date: 05/24/2021  Reason for Consultation/Follow-up: Establishing goals of care  Subjective:  Awake alert sitting in chair, appears with generalized weakness. No distress, states that he wants to be d/c from the hospital.  Length of Stay: 25  Current Medications: Scheduled Meds:  . (feeding supplement) PROSource Plus  30 mL Oral BID BM  . dexamethasone  4 mg Oral Q12H  . docusate sodium  100 mg Oral BID  . feeding supplement  237 mL Oral TID BM  . heparin  5,000 Units Subcutaneous Q8H  . levETIRAcetam  500 mg Oral BID  . pantoprazole  40 mg Oral QHS    Continuous Infusions: . methocarbamol (ROBAXIN) IV      PRN Meds: acetaminophen **OR** [DISCONTINUED] acetaminophen, alum & mag hydroxide-simeth, bisacodyl, LORazepam, LORazepam, methocarbamol **OR** methocarbamol (ROBAXIN) IV, ondansetron **OR** ondansetron (ZOFRAN) IV, polyethylene glycol, sodium phosphate  Physical Exam         Awake alert Frail No distress Regular work of breathing S 1 S 2  Abdomen is not distended No edema  Vital Signs: BP 121/85   Pulse 86   Temp 97.9 F (36.6 C)   Resp 18   Ht 5\' 7"  (1.702 m)   Wt 46.1 kg   SpO2 98%   BMI 15.92 kg/m  SpO2: SpO2: 98 % O2 Device: O2 Device: Room Air O2 Flow Rate: O2 Flow Rate (L/min): 2 L/min  Intake/output summary:  Intake/Output Summary (Last 24 hours) at 06/18/2021 1208 Last data filed at 06/17/2021 2025 Gross per 24 hour  Intake 472 ml  Output 500 ml  Net -28 ml   LBM: Last BM Date: 06/12/21 Baseline Weight: Weight: 59 kg Most  recent weight: Weight: 46.1 kg PPS 50%      Palliative Assessment/Data:      Patient Active Problem List   Diagnosis Date Noted  . Protein-calorie malnutrition, severe 05/30/2021  . Brain metastasis ([place]) 05/26/2021  . Metastatic cancer to spine ([place]) 05/25/2021  . Thoracic spine tumor 05/24/2021  . Lung cancer ([place]) 05/24/2021  . 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   Oncology/radiation oncology, Pulmonology, neurosurgery were all consulted.  Patient is undergoing radiation treatment.  Patient underwent laminectomy, thoracolumbar decompression and fusion with resection of mass on 05/25/2021 by neurosurgery.  MRI also showed metastatic brain lesions.   -Continue Decadron and Keppra.   -Palliative care was consulted.  Family wishes to continue with palliative radiation and chemotherapy as outpatient.  Recommendations/Plan: Re discussed goals of care with patient this am, completing radiation, is eager for discharge, discussed with him the plan for SNF rehab with palliative is pending. Will also discuss with family.  Call placed and discussed with brother who is next of kin. He will discuss with patient again, brother endorses continuing with current care, SNF rehab and is hopeful that patient will qualify for some palliative chemotherapy. I did give him some preliminary information about hospice.     Code Status:    Code Status Orders  (From admission, onward)           Start     Ordered   05/28/21 1620  Do not attempt resuscitation (DNR)  Continuous       Question Answer Comment  In the event of cardiac or respiratory ARREST Do not call a "code blue"   In the event of cardiac or respiratory ARREST Do not perform Intubation, CPR, defibrillation or ACLS   In the event of cardiac or  respiratory ARREST Use medication by any route, position, wound care, and other measures to relive pain and suffering. May use oxygen, suction and manual treatment of airway obstruction as needed for comfort.      05/28/21 1619           Code Status History     Date Active Date Inactive Code Status Order ID Comments User Context   05/24/2021 2146 05/28/2021 1619 Full Code [id number]  Elgergawy, Silver Huguenin, MD ED       Prognosis:  Unable to determine Medical oncology to be re consulted as per TRH.   Discharge Planning: To Be Determined SNF rehab is being arranged for.   Care plan was discussed with patient.    Thank you for allowing the Palliative Medicine Team to assist in the care of this patient.   Time In: 10 Time Out: 10.25 Total Time 25 Prolonged Time Billed  no       Greater than 50%  of this time was spent counseling and coordinating care related to the above assessment and plan.  Loistine Chance, MD  Please contact Palliative Medicine Team phone at [phone] for questions and concerns.

## 2021-06-18 NOTE — Progress Notes (Signed)
Physical Therapy Treatment Patient Details Name: Dylan Shaw MRN: [id number] DOB: Oct 26, 1966 Today's Date: 06/18/2021    History of Present Illness 55 y.o. man w/ new metastatic disease s/p thoracolumbar decompression and fusion on 05/25/21. Now w/ expressive aphasia 5/29 , self-limited, likely focal seizure. 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 smoker, 50 pound weight loss    PT Comments    Pt OOB in recliner.  General Comments: AxO x 1.5 requires repeat instruction and present with slight responce delay but other wise directions.  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 General transfer comment: required increased assist because he was slidd all the way down the seat and leaning backward.  + 2 side by side assist to stand.General Gait Details: VERY unsteady drunkin gait with lateral LOB and poor upright posture.  Very thin/deconditioned/mal nutrition male. Positioned upright in recliner with multiple pillows.  Set up lunch tray.   Pt is deconditioned and will need ST Rehab at SNF prior to retruning home.    Follow Up Recommendations  SNF     Equipment Recommendations  Rolling walker with 5" wheels;3in1 (PT)    Recommendations for Other Services       Precautions / Restrictions Precautions Precautions: Back;Fall Precaution Comments: reviewed back precautions - no carryover. Restrictions Weight Bearing Restrictions: No    Mobility  Bed Mobility               General bed mobility comments: OOB in recliner    Transfers Overall transfer level: Needs assistance Equipment used: Rolling walker (2 wheeled) Transfers: Sit to/from [facility] Sit to Stand: Mod assist;+2 physical assistance         General transfer comment: required increased assist because he was slidd all the way down the seat and leaning backward.  + 2 side by side assist to stand.  Ambulation/Gait Ambulation/Gait assistance: Min assist Gait  Distance (Feet): 145 Feet Assistive device: Rolling walker (2 wheeled) Gait Pattern/deviations: Step-through pattern;Scissoring;Drifts right/left;Narrow base of support Gait velocity: decreased   General Gait Details: VERY unsteady drunkin gait with lateral LOB and poor upright posture.  Very thin/deconditioned/mal nutrition male.   Stairs             Wheelchair Mobility    Modified Rankin (Stroke Patients Only)       Balance                                            Cognition Arousal/Alertness: Awake/alert Behavior During Therapy: Flat affect Overall Cognitive Status: Impaired/Different from baseline                                 General Comments: AxO x 1.5 requires repeat instruction and present with slight responce delay but other wise directions.  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      Exercises      General Comments        Pertinent Vitals/Pain Pain Assessment: Faces Faces Pain Scale: Hurts a little bit Pain Location: back Pain Descriptors / Indicators: Moaning;Grimacing;Guarding Pain Intervention(s): Monitored during session;Repositioned    Home Living                      Prior Function  PT Goals (current goals can now be found in the care plan section) Progress towards PT goals: Progressing toward goals    Frequency    Min 2X/week      PT Plan Current plan remains appropriate    Co-evaluation              AM-PAC PT "6 Clicks" Mobility   Outcome Measure  Help needed turning from your back to your side while in a flat bed without using bedrails?: A Little Help needed moving from lying on your back to sitting on the side of a flat bed without using bedrails?: A Little Help needed moving to and from a bed to a chair (including a wheelchair)?: A Lot Help needed standing up from a chair using your arms (e.g., wheelchair or bedside chair)?: A Lot Help needed to  walk in hospital room?: A Little Help needed climbing 3-5 steps with a railing? : A Lot 6 Click Score: 15    End of Session Equipment Utilized During Treatment: Gait belt Activity Tolerance: Patient tolerated treatment well Patient left: in chair;with call bell/phone within reach;with chair alarm set Nurse Communication: Mobility status PT Visit Diagnosis: Unsteadiness on feet (R26.81);Other abnormalities of gait and mobility (R26.89)     Time: 1415-1440 PT Time Calculation (min) (ACUTE ONLY): 25 min  Charges:  $Gait Training: 8-22 mins $Therapeutic Activity: 8-22 mins                     Rica Koyanagi  PTA Acute  Rehabilitation Services Pager      [phone] Office      [phone]

## 2021-06-18 NOTE — TOC Progression Note (Signed)
Transition of Care [facility]) - Progression Note    Patient Details  Name: Dylan Shaw MRN: [id number] Date of Birth: 08-05-1966  [facility]) CM/SW Contact  Leeroy Cha, RN Phone Number: 06/18/2021, 9:41 AM  Clinical Narrative:    Fl2 resend out to area snf's   Expected Discharge Plan: [place] Barriers to Discharge: SNF Pending bed offer  Expected Discharge Plan and Services Expected Discharge Plan: [place]   Discharge Planning Services: CM Consult Post Acute Care Choice: [place] Living arrangements for the past 2 months: Single Family Home                                       Social Determinants of Health (SDOH) Interventions    Readmission Risk Interventions No flowsheet data found.

## 2021-06-19 DIAGNOSIS — M795 Residual foreign body in soft tissue: Secondary | ICD-10-CM

## 2021-06-19 LAB — GLUCOSE, CAPILLARY
Glucose-Capillary: 90 mg/dL (ref 70–99)
Glucose-Capillary: 245 mg/dL — ABNORMAL HIGH (ref 70–99)
Glucose-Capillary: 130 mg/dL — ABNORMAL HIGH (ref 70–99)

## 2021-06-19 NOTE — Progress Notes (Signed)
Nutrition Follow-up  DOCUMENTATION CODES:   Underweight, Severe malnutrition in context of chronic illness  INTERVENTION:   Recommend initiation of bowel regimen as no BM documented x1 week  -Continue PROSource PLUS PO 24mls BID, each supplement provides 100 kcals and 15 grams of protein -Continue Ensure Enlive po TID, each supplement provides 350 kcal and 20 grams of protein -Continue Magic cup BID with meals, each supplement provides 290 kcal and 9 grams of protein  NUTRITION DIAGNOSIS:   Severe Malnutrition related to chronic illness (lung cancer with metastasis to spine and brain) as evidenced by severe muscle depletion, severe fat depletion.  ongoing  GOAL:   Patient will meet greater than or equal to 90% of their needs  progressing  MONITOR:   PO intake, Supplement acceptance, Weight trends, Labs, I & O's  REASON FOR ASSESSMENT:   Consult Assessment of nutrition requirement/status  ASSESSMENT:   Patient with PMH significant for tobacco use. Presents this admission with T12 metastatic lesion with epidural extension (presumed lung primary).  PMT following. Pt/family wish to continue with palliative radiation and chemo as outpatient. Pt is supposed to discharge to SNF.   PO intake remains quite variable, but overall inadequate to meet his nutritional needs. 0-100% meal completion x last 8 recorded meals (40% average meal intake). Per RN, pt doing well with oral nutrition supplements. Recommend continue current nutrition plan of care.   UOP: 873ml x24 hours  Weight not updated since 5/28.   Recommend initiation of bowel regimen as no BM documented x1 week  Scheduled Meds:  (feeding supplement) PROSource Plus  30 mL Oral BID BM   dexamethasone  4 mg Oral Q12H   docusate sodium  100 mg Oral BID   feeding supplement  237 mL Oral TID BM   heparin  5,000 Units Subcutaneous Q8H   levETIRAcetam  500 mg Oral BID   pantoprazole  40 mg Oral QHS   Labs reviewed. CBGs  90-245   Diet Order:   Diet Order             Diet regular Room service appropriate? No; Fluid consistency: Thin  Diet effective now                   EDUCATION NEEDS:   Not appropriate for education at this time  Skin:  Skin Assessment: Skin Integrity Issues: Skin Integrity Issues:: Incisions Incisions: back  Last BM:  6/7 -type 4  Height:   Ht Readings from Last 1 Encounters:  05/25/21 5\' 7"  (1.702 m)    Weight:   Wt Readings from Last 1 Encounters:  05/25/21 46.1 kg    BMI:  Body mass index is 15.92 kg/m.  Estimated Nutritional Needs:   Kcal:  1500-1700 kcal  Protein:  75-90 grams  Fluid:  >/= 1.5 L/day    Larkin Ina, MS, RD, LDN (she/her/hers) RD pager number and weekend/on-call pager number located in [place].

## 2021-06-19 NOTE — Progress Notes (Signed)
PROGRESS NOTE  Dylan Shaw ZOX:[id number] DOB: 1966/12/01 DOA: 05/24/2021 PCP: Patient, No Pcp Per (Inactive)   LOS: 26 days   Brief Narrative / Interim history: 55 years old male with history of cigarette smoking presented to hospital with significant weight loss fatigue low back pain.  He was found to have a lung cancer with metastasis to the spine.  He also developed urinary and bowel incontinence.  Neurosurgery was consulted and surgical decompression was performed.  Subsequently, oncology and radiation oncology were consulted.  Surgical pathology showed poorly differentiated lung adenocarcinoma.  Patient had prolonged hospital course complicated by encephalopathy and  intermittent agitation.  Oncology recommended hospice versus palliative whole brain radiation.  Family was interested in proceeding with whole brain radiation and palliative chemotherapy.  At this time, patient is awaiting for skilled nursing facility   Subjective / 24h Interval events: In chair, no complaints.  Appears sleepy.  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  Patient completed radiation treatment.  Patient underwent laminectomy, thoracolumbar decompression and fusion with resection of mass on 05/25/2021 by neurosurgery.  MRI also showed metastatic brain lesions.   -Continue Decadron and Keppra.   -Palliative care was also consulted.  Family wishes to continue with palliative radiation and chemotherapy as outpatient. -Oncology following, for now awaiting placement and will start potential further treatment once outpatient  Active Problems Metabolic encephalopathy secondary to brain mets, mass-effect, steroid use, seizure with postictal phase-Improved.  Try to avoid Haldol due to concern for seizures.     Leukocytosis -Mild.  No concern for  infection.  On steroids.   Aphasia/focal seizure -EEG was negative for seizure.  Neurology was consulted.  On Keppra at this time.  Has improved at this time,  no further seizures.  Code stroke was called on 05/26/2021.  Negative CT for stroke.   Severe protein calorie moderation-Severe temporal wasting.  BMI 15.9.  Continue dietary supplements.      Tobacco use-Not on any patch at this time.   Normocytic anemia -Likely secondary to malignancy.   Debility/deconditioning - PT/OT recommending skilled nursing facility on discharge.    Scheduled Meds:  (feeding supplement) PROSource Plus  30 mL Oral BID BM   dexamethasone  4 mg Oral Q12H   docusate sodium  100 mg Oral BID   feeding supplement  237 mL Oral TID BM   heparin  5,000 Units Subcutaneous Q8H   levETIRAcetam  500 mg Oral BID   pantoprazole  40 mg Oral QHS   Continuous Infusions:  methocarbamol (ROBAXIN) IV     PRN Meds:.acetaminophen **OR** [DISCONTINUED] acetaminophen, alum & mag hydroxide-simeth, bisacodyl, LORazepam, LORazepam, methocarbamol **OR** methocarbamol (ROBAXIN) IV, ondansetron **OR** ondansetron (ZOFRAN) IV, polyethylene glycol, sodium phosphate  Diet Orders (From admission, onward)     Start     Ordered   06/07/21 1612  Diet regular Room service appropriate? No; Fluid consistency: Thin  Diet effective now       Comments: Magic cup TID with meals  Question Answer Comment  Room service appropriate? No   Fluid consistency: Thin      06/07/21 1613            DVT prophylaxis: heparin injection 5,000 Units Start: 05/27/21 1430 SCD's Start: 05/26/21 0412     Code Status: DNR  Family Communication: no family present  Status is: Inpatient  Remains inpatient appropriate because:Inpatient level of  care appropriate due to severity of illness  Dispo: The patient is from: Home              Anticipated d/c is to: SNF              Patient currently is not medically stable to d/c.   Difficult to place  patient Yes   Level of care: Med-Surg  Consultants:  Oncology  Radiation oncology Pulmonary Palliative care Neurosurgery  Procedures:  XRT Laminectomy, decompressive spine surgery  Microbiology  none  Antimicrobials: none    Objective: Vitals:   06/18/21 0555 06/18/21 1333 06/18/21 2100 06/19/21 0605  BP: 121/85 118/85 119/87 118/80  Pulse: 86 97 (!) 103 (!) 101  Resp: 18 (!) 24 18 19   Temp: 97.9 F (36.6 C) 98 F (36.7 C) 98 F (36.7 C) 98 F (36.7 C)  TempSrc:   Oral Oral  SpO2: 98% 99%  96%  Weight:      Height:        Intake/Output Summary (Last 24 hours) at 06/19/2021 1305 Last data filed at 06/19/2021 0416 Gross per 24 hour  Intake --  Output 800 ml  Net -800 ml   Filed Weights   05/24/21 2208 05/25/21 0601 05/25/21 1737  Weight: 46.1 kg 46.1 kg 46.1 kg    Examination:  Constitutional: NAD Eyes: no scleral icterus ENMT: Mucous membranes are moist.  Neck: normal, supple Respiratory: clear to auscultation bilaterally, no wheezing, no crackles. Normal respiratory effort.  Cardiovascular: Regular rate and rhythm, no murmurs / rubs / gallops. No LE edema. Abdomen: non distended, no tenderness. Bowel sounds positive.  Musculoskeletal: no clubbing / cyanosis.  Skin: no rashes Neurologic: non focal    Data Reviewed: I have independently reviewed following labs and imaging studies   CBC: No results for input(s): WBC, NEUTROABS, HGB, HCT, MCV, PLT in the last 168 hours. Basic Metabolic Panel: No results for input(s): NA, K, CL, CO2, GLUCOSE, BUN, CREATININE, CALCIUM, MG, PHOS in the last 168 hours. Liver Function Tests: No results for input(s): AST, ALT, ALKPHOS, BILITOT, PROT, ALBUMIN in the last 168 hours. Coagulation Profile: No results for input(s): INR, PROTIME in the last 168 hours. HbA1C: No results for input(s): HGBA1C in the last 72 hours. CBG: Recent Labs  Lab 06/18/21 0646 06/18/21 1138 06/18/21 1717 06/19/21 0619 06/19/21 1143   GLUCAP 154* 169* 115* 90 245*    No results found for this or any previous visit (from the past 240 hour(s)).   Radiology Studies: No results found.  Marzetta Board, MD, PhD [facility]  Between 7 am - 7 pm I am available, please contact me via Amion (for emergencies) or Securechat (non urgent messages)  Between 7 pm - 7 am I am not available, please contact night coverage MD/APP via Amion

## 2021-06-19 NOTE — TOC Progression Note (Addendum)
Transition of Care [facility]) - Progression Note    Patient Details  Name: Dylan Shaw MRN: [id number] Date of Birth: 06/22/1966  [facility] CM/SW Contact  Leeroy Cha, RN Phone Number: 06/19/2021, 10:02 AM  Clinical Narrative:    [id number] message lon voice recording to please call back to discuss possible placement. Tct [facility] message left on the admission dept wcb. TCF=Kitty at [facility] we do not take [employer]. Tcf-Theresa at [facility] we do not take [employer]. Tct robert the brother-have not rec'd any mail at this time. About getting patient on medicaid. Tct-finanical planning-informed that brother will at the hospital in a little bit of time. Tct anna Lemann-message to please contact the brother Herbie [place] for fin. Aide and medicaid. Expected Discharge Plan: Skilled Nursing Facility Barriers to Discharge: SNF Pending bed offer  Expected Discharge Plan and Services Expected Discharge Plan: [place]   Discharge Planning Services: CM Consult Post Acute Care Choice: [place] Living arrangements for the past 2 months: Single Family Home                                       Social Determinants of Health (SDOH) Interventions    Readmission Risk Interventions No flowsheet data found.

## 2021-06-20 LAB — GLUCOSE, CAPILLARY
Glucose-Capillary: 105 mg/dL — ABNORMAL HIGH (ref 70–99)
Glucose-Capillary: 135 mg/dL — ABNORMAL HIGH (ref 70–99)

## 2021-06-20 MED ORDER — DEXAMETHASONE 4 MG PO TABS
2.0000 mg | ORAL_TABLET | Freq: Two times a day (BID) | ORAL | Status: DC
Start: 2021-06-20 — End: 2021-06-28
  Administered 2021-06-20 – 2021-06-28 (×16): 2 mg via ORAL
  Filled 2021-06-20 (×16): qty 1

## 2021-06-20 NOTE — Progress Notes (Signed)
  Radiation Oncology         [phone]) [phone] ________________________________  Name: Dylan Shaw  RCV:[id number]  Date of Service: 06/20/21  DOB: Dec 15, 1966   Steroid Taper Instructions   You currently have a prescription for Dexamethasone 4 mg Tablets.    Beginning 06/20/21: Take 1/2 of a tablet (which is 2 mg) twice a day  Beginning 06/27/21: Take 1/2 of a tablet (which is 2 mg) once a day  Beginning 07/04/21: Take 1/2 of a tablet (which is 2 mg) every other day and stop on 07/10/21.   Please call our office if you have any headaches, visual changes, uncontrolled movements, nausea or vomiting.

## 2021-06-20 NOTE — Progress Notes (Signed)
PROGRESS NOTE  Dylan Shaw POE:[id number] DOB: 02-05-1966 DOA: 05/24/2021 PCP: Patient, No Pcp Per (Inactive)   LOS: 27 days   Brief Narrative / Interim history: 55 years old male with history of cigarette smoking presented to hospital with significant weight loss fatigue low back pain.  He was found to have a lung cancer with metastasis to the spine.  He also developed urinary and bowel incontinence.  Neurosurgery was consulted and surgical decompression was performed.  Subsequently, oncology and radiation oncology were consulted.  Surgical pathology showed poorly differentiated lung adenocarcinoma.  Patient had prolonged hospital course complicated by encephalopathy and  intermittent agitation.  Oncology recommended hospice versus palliative whole brain radiation.  Family was interested in proceeding with whole brain radiation and palliative chemotherapy.  At this time, patient is awaiting for skilled nursing facility   Subjective / 24h Interval events: No complaints. 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  Patient completed radiation treatment.  Patient underwent laminectomy, thoracolumbar decompression and fusion with resection of mass on 05/25/2021 by neurosurgery.  MRI also showed metastatic brain lesions.   -Continue Decadron and Keppra.   -Palliative care was also consulted.  Family wishes to continue with palliative radiation and chemotherapy as outpatient. -Oncology following, for now awaiting placement and will start potential further treatment once outpatient  Active Problems Metabolic encephalopathy secondary to brain mets, mass-effect, steroid use, seizure with postictal phase-Improved.  Try to avoid Haldol due to concern for seizures.     Leukocytosis -Mild.  No concern for infection.  On  steroids.   Aphasia/focal seizure -EEG was negative for seizure.  Neurology was consulted.  On Keppra at this time.  Has improved at this time,  no further seizures.  Code stroke was called on 05/26/2021.  Negative CT for stroke.   Severe protein calorie moderation-Severe temporal wasting.  BMI 15.9.  Continue dietary supplements.      Tobacco use-Not on any patch at this time.   Normocytic anemia -Likely secondary to malignancy.   Debility/deconditioning - PT/OT recommending skilled nursing facility on discharge.    Scheduled Meds:  (feeding supplement) PROSource Plus  30 mL Oral BID BM   dexamethasone  4 mg Oral Q12H   docusate sodium  100 mg Oral BID   feeding supplement  237 mL Oral TID BM   heparin  5,000 Units Subcutaneous Q8H   levETIRAcetam  500 mg Oral BID   pantoprazole  40 mg Oral QHS   Continuous Infusions:  methocarbamol (ROBAXIN) IV     PRN Meds:.acetaminophen **OR** [DISCONTINUED] acetaminophen, alum & mag hydroxide-simeth, bisacodyl, LORazepam, LORazepam, methocarbamol **OR** methocarbamol (ROBAXIN) IV, ondansetron **OR** ondansetron (ZOFRAN) IV, polyethylene glycol, sodium phosphate  Diet Orders (From admission, onward)     Start     Ordered   06/07/21 1612  Diet regular Room service appropriate? No; Fluid consistency: Thin  Diet effective now       Comments: Magic cup TID with meals  Question Answer Comment  Room service appropriate? No   Fluid consistency: Thin      06/07/21 1613            DVT prophylaxis: heparin injection 5,000 Units Start: 05/27/21 1430 SCD's Start: 05/26/21 0412     Code Status: DNR  Family Communication: no family present  Status is: Inpatient  Remains inpatient appropriate because:Inpatient level of care appropriate  due to severity of illness  Dispo: The patient is from: Home              Anticipated d/c is to: SNF              Patient currently is not medically stable to d/c.   Difficult to place patient Yes  Level  of care: Med-Surg  Consultants:  Oncology  Radiation oncology Pulmonary Palliative care Neurosurgery  Procedures:  XRT Laminectomy, decompressive spine surgery  Microbiology  none  Antimicrobials: none    Objective: Vitals:   06/18/21 2100 06/19/21 0605 06/19/21 2039 06/20/21 0440  BP: 119/87 118/80 114/75 121/86  Pulse: (!) 103 (!) 101 (!) 101 97  Resp: 18 19 18 18   Temp: 98 F (36.7 C) 98 F (36.7 C) 98.2 F (36.8 C) 98 F (36.7 C)  TempSrc: Oral Oral Oral   SpO2:  96% 95% 99%  Weight:      Height:       No intake or output data in the 24 hours ending 06/20/21 1043  Filed Weights   05/24/21 2208 05/25/21 0601 05/25/21 1737  Weight: 46.1 kg 46.1 kg 46.1 kg    Examination:  Constitutional: nad Respiratory: CTA biL, no wheezing Cardiovascular: rrr, no mrg, no edema  Data Reviewed: I have independently reviewed following labs and imaging studies   CBC: No results for input(s): WBC, NEUTROABS, HGB, HCT, MCV, PLT in the last 168 hours. Basic Metabolic Panel: No results for input(s): NA, K, CL, CO2, GLUCOSE, BUN, CREATININE, CALCIUM, MG, PHOS in the last 168 hours. Liver Function Tests: No results for input(s): AST, ALT, ALKPHOS, BILITOT, PROT, ALBUMIN in the last 168 hours. Coagulation Profile: No results for input(s): INR, PROTIME in the last 168 hours. HbA1C: No results for input(s): HGBA1C in the last 72 hours. CBG: Recent Labs  Lab 06/18/21 1717 06/19/21 0619 06/19/21 1143 06/19/21 1645 06/20/21 0731  GLUCAP 115* 90 245* 130* 105*     No results found for this or any previous visit (from the past 240 hour(s)).   Radiology Studies: No results found.  Marzetta Board, MD, PhD [facility]  Between 7 am - 7 pm I am available, please contact me via Amion (for emergencies) or Securechat (non urgent messages)  Between 7 pm - 7 am I am not available, please contact night coverage MD/APP via Amion

## 2021-06-20 NOTE — Progress Notes (Signed)
Physical Therapy Treatment Patient Details Name: Dylan Shaw MRN: [id number] DOB: [birth year] Today's Date: 06/20/2021    History of Present Illness 55 y.o. man w/ new metastatic disease s/p thoracolumbar decompression and fusion on 05/25/21. Now w/ expressive aphasia 5/29 , self-limited, likely focal seizure. 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 smoker, 50 pound weight loss    PT Comments    A&O x3 Pt continues to be very limited in his activity tolerance. He continues to need assistance with bed mobility and transfers.  General gait comments: very unstable drunken gait, postural sway noted throughout with LOB x4. Difficulty walking in a straight line with swerving back and forth noted. Cues needed for upright posture but pt unable to maintain. Pt is deconditioned and will need ST Rehab at SNF prior to retruning home   Follow Up Recommendations  SNF     Equipment Recommendations  Rolling walker with 5" wheels;3in1 (PT)    Recommendations for Other Services       Precautions / Restrictions Precautions Precautions: Back;Fall    Mobility  Bed Mobility Overal bed mobility: Needs Assistance Bed Mobility: Supine to Sit;Sit to Supine Rolling: Min assist Sidelying to sit: Min assist Supine to sit: Min assist          Transfers Overall transfer level: Needs assistance Equipment used: Rolling walker (2 wheeled) Transfers: Sit to/from Stand Sit to Stand: Min assist            Ambulation/Gait Ambulation/Gait assistance: Herbalist (Feet): 150 Feet Assistive device: Rolling walker (2 wheeled) Gait Pattern/deviations: Step-through pattern;Scissoring;Drifts right/left;Narrow base of support Gait velocity: decreased   General Gait Details: unsteady gait with LOB x4. lateral postural sway noted throughout session with poor upright posture.   Stairs             Wheelchair Mobility    Modified Rankin (Stroke Patients Only)        Balance                                            Cognition Arousal/Alertness: Awake/alert Behavior During Therapy: Flat affect Overall Cognitive Status: Impaired/Different from baseline Area of Impairment: Safety/judgement;Problem solving                         Safety/Judgement: Decreased awareness of safety;Decreased awareness of deficits   Problem Solving: Slow processing;Decreased initiation        Exercises      General Comments        Pertinent Vitals/Pain Pain Score: 6  Pain Location: back Pain Intervention(s): Monitored during session;Repositioned    Home Living                      Prior Function            PT Goals (current goals can now be found in the care plan section) Acute Rehab PT Goals Patient Stated Goal: patient does not state PT Goal Formulation: With patient Time For Goal Achievement: 06/24/21 Potential to Achieve Goals: Good Progress towards PT goals: Progressing toward goals    Frequency    Min 2X/week      PT Plan Current plan remains appropriate    Co-evaluation              AM-PAC PT "6 Clicks" Mobility  Outcome Measure  Help needed turning from your back to your side while in a flat bed without using bedrails?: A Little Help needed moving from lying on your back to sitting on the side of a flat bed without using bedrails?: A Little Help needed moving to and from a bed to a chair (including a wheelchair)?: A Lot Help needed standing up from a chair using your arms (e.g., wheelchair or bedside chair)?: A Lot Help needed to walk in hospital room?: A Little Help needed climbing 3-5 steps with a railing? : A Lot 6 Click Score: 15    End of Session Equipment Utilized During Treatment: Gait belt Activity Tolerance: Patient tolerated treatment well Patient left: in chair;with call bell/phone within reach;with chair alarm set Nurse Communication: Mobility status PT Visit  Diagnosis: Unsteadiness on feet (R26.81);Other abnormalities of gait and mobility (R26.89)     Time: 1355-1409 PT Time Calculation (min) (ACUTE ONLY): 14 min  Charges:  $Gait Training: 8-22 mins                    Ernst Spell, PTA Student  Acute Rehabilitation Services Pager : [phone] Office : [phone]

## 2021-06-20 NOTE — Progress Notes (Signed)
Patient refuses finger stick for cbg at this time.  He has also removed his condom cath and is refusing another one.

## 2021-06-21 LAB — GLUCOSE, CAPILLARY
Glucose-Capillary: 94 mg/dL (ref 70–99)
Glucose-Capillary: 101 mg/dL — ABNORMAL HIGH (ref 70–99)
Glucose-Capillary: 143 mg/dL — ABNORMAL HIGH (ref 70–99)
Glucose-Capillary: 113 mg/dL — ABNORMAL HIGH (ref 70–99)

## 2021-06-21 LAB — COMPREHENSIVE METABOLIC PANEL
ALT: 24 U/L (ref 0–44)
AST: 16 U/L (ref 15–41)
Albumin: 2.5 g/dL — ABNORMAL LOW (ref 3.5–5.0)
Alkaline Phosphatase: 103 U/L (ref 38–126)
Anion gap: 10 (ref 5–15)
BUN: 22 mg/dL — ABNORMAL HIGH (ref 6–20)
CO2: 29 mmol/L (ref 22–32)
Calcium: 9.6 mg/dL (ref 8.9–10.3)
Chloride: 99 mmol/L (ref 98–111)
Creatinine, Ser: 0.67 mg/dL (ref 0.61–1.24)
GFR, Estimated: >60 mL/min (ref >60)
Glucose, Bld: 101 mg/dL — ABNORMAL HIGH (ref 70–99)
Potassium: 4.3 mmol/L (ref 3.5–5.1)
Sodium: 138 mmol/L (ref 135–145)
Total Bilirubin: 0.5 mg/dL (ref 0.3–1.2)
Total Protein: 7.4 g/dL (ref 6.5–8.1)

## 2021-06-21 LAB — CBC
HCT: 37.2 % — ABNORMAL LOW (ref 39.0–52.0)
Hemoglobin: 11.5 g/dL — ABNORMAL LOW (ref 13.0–17.0)
MCH: 28.3 pg (ref 26.0–34.0)
MCHC: 30.9 g/dL (ref 30.0–36.0)
MCV: 91.4 fL (ref 80.0–100.0)
Platelets: 370 10*3/uL (ref 150–400)
RBC: 4.07 MIL/uL — ABNORMAL LOW (ref 4.22–5.81)
RDW: 16.5 % — ABNORMAL HIGH (ref 11.5–15.5)
WBC: 11.5 10*3/uL — ABNORMAL HIGH (ref 4.0–10.5)
nRBC: 0 % (ref 0.0–0.2)

## 2021-06-21 NOTE — Progress Notes (Addendum)
PROGRESS NOTE  Dylan Shaw BBC:[id number] DOB: 1966/08/15 DOA: 05/24/2021 PCP: Patient, No Pcp Per (Inactive)   LOS: 28 days   Brief Narrative / Interim history: 55 years old male with history of cigarette smoking presented to hospital with significant weight loss fatigue low back pain.  He was found to have a lung cancer with metastasis to the spine.  He also developed urinary and bowel incontinence.  Neurosurgery was consulted and surgical decompression was performed.  Subsequently, oncology and radiation oncology were consulted.  Surgical pathology showed poorly differentiated lung adenocarcinoma.  Patient had prolonged hospital course complicated by encephalopathy and  intermittent agitation.  Oncology recommended hospice versus palliative whole brain radiation.  Family was interested in proceeding with whole brain radiation and palliative chemotherapy.  At this time, patient is awaiting for skilled nursing facility   Subjective / 24h Interval events: No complaints. 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  Patient completed radiation treatment.  Patient underwent laminectomy, thoracolumbar decompression and fusion with resection of mass on 05/25/2021 by neurosurgery.  MRI also showed metastatic brain lesions.   -Continue Decadron and Keppra.  Taper Decadron 2 mg twice daily 6/23, then 2 mg daily up until 7/7 then 2 mg every other day and eventually stop on 7/13 -Palliative care was also consulted.  Family wishes to continue with palliative radiation and chemotherapy as outpatient. -Oncology following, for now awaiting placement and will start potential further treatment once outpatient  Active Problems Metabolic encephalopathy secondary to brain mets, mass-effect, steroid use, seizure with postictal  phase-Improved.  Try to avoid Haldol due to concern for seizures.     Leukocytosis -Mild.  No concern for infection.  On steroids.   Aphasia/focal seizure -EEG was negative for seizure.  Neurology was consulted.  On Keppra at this time.  Has improved at this time,  no further seizures.  Code stroke was called on 05/26/2021.  Negative CT for stroke.   Severe protein calorie moderation-Severe temporal wasting.  BMI 15.9.  Continue dietary supplements.      Tobacco use-Not on any patch at this time.   Normocytic anemia -Likely secondary to malignancy.   Debility/deconditioning - PT/OT recommending skilled nursing facility on discharge.    Scheduled Meds:  (feeding supplement) PROSource Plus  30 mL Oral BID BM   dexamethasone  2 mg Oral Q12H   docusate sodium  100 mg Oral BID   feeding supplement  237 mL Oral TID BM   heparin  5,000 Units Subcutaneous Q8H   levETIRAcetam  500 mg Oral BID   pantoprazole  40 mg Oral QHS   Continuous Infusions:  methocarbamol (ROBAXIN) IV     PRN Meds:.acetaminophen **OR** [DISCONTINUED] acetaminophen, alum & mag hydroxide-simeth, bisacodyl, LORazepam, LORazepam, methocarbamol **OR** methocarbamol (ROBAXIN) IV, ondansetron **OR** ondansetron (ZOFRAN) IV, polyethylene glycol, sodium phosphate  Diet Orders (From admission, onward)     Start     Ordered   06/07/21 1612  Diet regular Room service appropriate? No; Fluid consistency: Thin  Diet effective now       Comments: Magic cup TID with meals  Question Answer Comment  Room service appropriate? No   Fluid consistency: Thin      06/07/21 1613            DVT prophylaxis: heparin injection 5,000 Units Start: 05/27/21 1430 SCD's Start: 05/26/21 5038  Code Status: DNR  Family Communication: no family present  Status is: Inpatient  Remains inpatient appropriate because:Inpatient level of care appropriate due to severity of illness  Dispo: The patient is from: Home               Anticipated d/c is to: SNF              Patient currently is not medically stable to d/c.   Difficult to place patient Yes  Level of care: Med-Surg  Consultants:  Oncology  Radiation oncology Pulmonary Palliative care Neurosurgery  Procedures:  XRT Laminectomy, decompressive spine surgery  Microbiology  none  Antimicrobials: none    Objective: Vitals:   06/20/21 0440 06/20/21 1412 06/20/21 2008 06/21/21 0444  BP: 121/86 102/80 120/84 (!) 120/93  Pulse: 97 (!) 108 93 100  Resp: 18 20 18 18   Temp: 98 F (36.7 C) 98.8 F (37.1 C) 98.8 F (37.1 C) (!) 97.5 F (36.4 C)  TempSrc:  Oral  Oral  SpO2: 99% 95% 100% 100%  Weight:      Height:       No intake or output data in the 24 hours ending 06/21/21 0951  Filed Weights   05/24/21 2208 05/25/21 0601 05/25/21 1737  Weight: 46.1 kg 46.1 kg 46.1 kg    Examination:  Constitutional: NAD Respiratory: Clear, no wheezing Cardiovascular: Regular rate and rhythm, no murmurs, no edema  Data Reviewed: I have independently reviewed following labs and imaging studies   CBC: Recent Labs  Lab 06/21/21 0438  WBC 11.5*  HGB 11.5*  HCT 37.2*  MCV 91.4  PLT 433   Basic Metabolic Panel: Recent Labs  Lab 06/21/21 0438  NA 138  K 4.3  CL 99  CO2 29  GLUCOSE 101*  BUN 22*  CREATININE 0.67  CALCIUM 9.6   Liver Function Tests: Recent Labs  Lab 06/21/21 0438  AST 16  ALT 24  ALKPHOS 103  BILITOT 0.5  PROT 7.4  ALBUMIN 2.5*   Coagulation Profile: No results for input(s): INR, PROTIME in the last 168 hours. HbA1C: No results for input(s): HGBA1C in the last 72 hours. CBG: Recent Labs  Lab 06/19/21 1645 06/20/21 0731 06/20/21 1127 06/21/21 0631 06/21/21 0751  GLUCAP 130* 105* 135* 94 101*     No results found for this or any previous visit (from the past 240 hour(s)).   Radiology Studies: No results found.  Marzetta Board, MD, PhD [facility]  Between 7 am - 7 pm I am available,  please contact me via Amion (for emergencies) or Securechat (non urgent messages)  Between 7 pm - 7 am I am not available, please contact night coverage MD/APP via Amion

## 2021-06-21 NOTE — Progress Notes (Signed)
   06/21/21 1352  Assess: MEWS Score  Temp 97.9 F (36.6 C)  BP 112/83  Pulse Rate (!) 124  Resp 18  SpO2 99 %  O2 Device Room Air  Assess: MEWS Score  MEWS Temp 0  MEWS Systolic 0  MEWS Pulse 2  MEWS RR 0  MEWS LOC 0  MEWS Score 2  MEWS Score Color Yellow  Assess: if the MEWS score is Yellow or Red  Were vital signs taken at a resting state? Yes  Focused Assessment No change from prior assessment  Does the patient meet 2 or more of the SIRS criteria? No  Does the patient have a confirmed or suspected source of infection? No  Provider and Rapid Response Notified? No  MEWS guidelines implemented *See Row Information* Yes  Treat  MEWS Interventions Escalated (See documentation below)  Pain Scale 0-10  Pain Score 0  Take Vital Signs  Increase Vital Sign Frequency  Yellow: Q 2hr X 2 then Q 4hr X 2, if remains yellow, continue Q 4hrs  Escalate  MEWS: Escalate Yellow: discuss with charge nurse/RN and consider discussing with provider and RRT  Notify: Charge Nurse/RN  Name of Charge Nurse/RN Notified Keenan Bachelor RN  Date Charge Nurse/RN Notified 06/21/21  Time Charge Nurse/RN Notified 1405  Notify: Provider  Provider Name/Title Gherghe  Date Provider Notified 06/21/21  Time Provider Notified 1405  Notification Type Page  Notification Reason Other (Comment) (yellow mews implementation)  Document  Patient Outcome Other (Comment) (patient is stable.)  Progress note created (see row info) Yes  Assess: SIRS CRITERIA  SIRS Temperature  0  SIRS Pulse 1  SIRS Respirations  0  SIRS WBC 0  SIRS Score Sum  1  Patient in yellow mews due to increased heart rate.  Patient asymptomatic at this time and resting comfortably.  Charge nurse and MD aware.

## 2021-06-21 NOTE — Progress Notes (Signed)
Physical Therapy Treatment Patient Details Name: Dylan Shaw MRN: [id number] DOB: September 25, 1966 Today's Date: 06/21/2021    History of Present Illness 55 y.o. man w/ new metastatic disease s/p thoracolumbar decompression and fusion on 05/25/21. Now w/ expressive aphasia 5/29 , self-limited, likely focal seizure. 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 smoker, 50 pound weight loss    PT Comments    Pt is NOT progressing.  Continues to requiring increased assist with each session.  Pt is profoundly weak and visibly anorexic.   Will have LPT to see next visit to re eval for appropriateness.      Follow Up Recommendations  SNF     Equipment Recommendations       Recommendations for Other Services       Precautions / Restrictions Precautions Precautions: Back;Fall Precaution Comments: reviewed back precautions - no carryover.  Also    Mobility  Bed Mobility               General bed mobility comments: OOB in recliner    Transfers Overall transfer level: Needs assistance Equipment used: Rolling walker (2 wheeled) Transfers: Sit to/from [facility] Sit to Stand: Max assist;+2 safety/equipment Stand pivot transfers: Total assist;Max assist;+2 physical assistance;+2 safety/equipment       General transfer comment: required increased assit this session.  Profound weakness and VERY unstable.  LOB onto bed with Therapist assisted.  Ambulation/Gait Ambulation/Gait assistance: Max assist;Total assist;+2 physical assistance;+2 safety/equipment Gait Distance (Feet): 3 Feet Assistive device: Rolling walker (2 wheeled) Gait Pattern/deviations: Step-through pattern;Scissoring;Drifts right/left;Narrow base of support Gait velocity: decreased   General Gait Details: prfound weakness, instability with inability to support his own weight.  Max c/o back pain and complete exhaustion.  Recliner pulled up to pt.   Stairs              Wheelchair Mobility    Modified Rankin (Stroke Patients Only)       Balance                                            Cognition Arousal/Alertness: Awake/alert Behavior During Therapy: Flat affect Overall Cognitive Status: Impaired/Different from baseline Area of Impairment: Safety/judgement;Problem solving                 Orientation Level: Disoriented to;Place;Situation;Time   Memory: Decreased short-term memory;Decreased recall of precautions   Safety/Judgement: Decreased awareness of safety;Decreased awareness of deficits     General Comments: AxO x 1.5 requires repeat instruction and present with slight responce delay but other wise directions.  Pretty much says no to everything.      Exercises      General Comments        Pertinent Vitals/Pain Pain Assessment: Faces Faces Pain Scale: Hurts whole lot Pain Location: back Pain Descriptors / Indicators: Moaning;Grimacing;Guarding Pain Intervention(s): Monitored during session    Home Living                      Prior Function            PT Goals (current goals can now be found in the care plan section) Progress towards PT goals: Progressing toward goals    Frequency    Min 2X/week      PT Plan Current plan remains appropriate    Co-evaluation  AM-PAC PT "6 Clicks" Mobility   Outcome Measure  Help needed turning from your back to your side while in a flat bed without using bedrails?: Total Help needed moving from lying on your back to sitting on the side of a flat bed without using bedrails?: Total Help needed moving to and from a bed to a chair (including a wheelchair)?: Total Help needed standing up from a chair using your arms (e.g., wheelchair or bedside chair)?: Total Help needed to walk in hospital room?: Total Help needed climbing 3-5 steps with a railing? : Total 6 Click Score: 6    End of Session Equipment Utilized During  Treatment: Gait belt Activity Tolerance: Other (comment) (significant decline in mobility ability) Patient left: in chair;with call bell/phone within reach;with chair alarm set Nurse Communication: Mobility status PT Visit Diagnosis: Unsteadiness on feet (R26.81);Other abnormalities of gait and mobility (R26.89)     Time: 0045-9977 PT Time Calculation (min) (ACUTE ONLY): 25 min  Charges:  $Gait Training: 8-22 mins $Therapeutic Activity: 8-22 mins                     {Dereona Kolodny  PTA Acute  Rehabilitation Services Pager      [phone] Office      [phone]

## 2021-06-22 LAB — GLUCOSE, CAPILLARY
Glucose-Capillary: 109 mg/dL — ABNORMAL HIGH (ref 70–99)
Glucose-Capillary: 209 mg/dL — ABNORMAL HIGH (ref 70–99)
Glucose-Capillary: 143 mg/dL — ABNORMAL HIGH (ref 70–99)

## 2021-06-22 NOTE — Progress Notes (Signed)
   06/22/21 1950  Assess: MEWS Score  Temp 98.3 F (36.8 C)  BP 108/72  Pulse Rate (!) 104  Resp (!) 21  Level of Consciousness Alert  SpO2 97 %  O2 Device Room Air  Assess: MEWS Score  MEWS Temp 0  MEWS Systolic 0  MEWS Pulse 1  MEWS RR 1  MEWS LOC 0  MEWS Score 2  MEWS Score Color Yellow  Assess: if the MEWS score is Yellow or Red  Were vital signs taken at a resting state? Yes  Focused Assessment No change from prior assessment  Does the patient meet 2 or more of the SIRS criteria? No  Does the patient have a confirmed or suspected source of infection? No  Provider and Rapid Response Notified? No  MEWS guidelines implemented *See Row Information* Yes  Treat  MEWS Interventions Escalated (See documentation below) (notified charge RN)  Pain Scale 0-10  Pain Score 0  Take Vital Signs  Increase Vital Sign Frequency  Yellow: Q 2hr X 2 then Q 4hr X 2, if remains yellow, continue Q 4hrs  Escalate  MEWS: Escalate Yellow: discuss with charge nurse/RN and consider discussing with provider and RRT  Notify: Charge Nurse/RN  Name of Charge Nurse/RN Notified Debbie, RN  Date Charge Nurse/RN Notified 06/22/21  Time Charge Nurse/RN Notified 1955  Document  Patient Outcome Stabilized after interventions  Progress note created (see row info) Yes  Assess: SIRS CRITERIA  SIRS Temperature  0  SIRS Pulse 1  SIRS Respirations  1  SIRS WBC 0  SIRS Score Sum  2

## 2021-06-22 NOTE — Progress Notes (Signed)
PROGRESS NOTE  Dylan Shaw BHA:[id number] DOB: April 02, 1966 DOA: 05/24/2021 PCP: Patient, No Pcp Per (Inactive)   LOS: 29 days   Brief Narrative / Interim history: 55 years old male with history of cigarette smoking presented to hospital with significant weight loss fatigue low back pain.  He was found to have a lung cancer with metastasis to the spine.  He also developed urinary and bowel incontinence.  Neurosurgery was consulted and surgical decompression was performed.  Subsequently, oncology and radiation oncology were consulted.  Surgical pathology showed poorly differentiated lung adenocarcinoma.  Patient had prolonged hospital course complicated by encephalopathy and  intermittent agitation.  Oncology recommended hospice versus palliative whole brain radiation.  Family was interested in proceeding with whole brain radiation and palliative chemotherapy.  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  Patient completed radiation treatment.  Patient underwent laminectomy, thoracolumbar decompression and fusion with resection of mass on 05/25/2021 by neurosurgery.  MRI also showed metastatic brain lesions.   -Continue Decadron and Keppra.  Taper Decadron 2 mg twice daily 6/23, then 2 mg daily up until 7/7 then 2 mg every other day and eventually stop on 7/13 -Palliative care was also consulted.  Family wishes to continue with palliative radiation and chemotherapy as outpatient. -Oncology following, for now awaiting placement and will start potential further treatment once outpatient  Active Problems Metabolic encephalopathy secondary to brain mets, mass-effect, steroid use, seizure with postictal  phase-Improved.  Try to avoid Haldol due to concern for seizures.     Leukocytosis -Mild.  No concern for infection.  On steroids.   Aphasia/focal seizure -EEG was negative for seizure.  Neurology was consulted.  On Keppra at this time.  Has improved at this time,  no further seizures.  Code stroke was called on 05/26/2021.  Negative CT for stroke.   Severe protein calorie moderation-Severe temporal wasting.  BMI 15.9.  Continue dietary supplements.      Tobacco use-Not on any patch at this time.   Normocytic anemia -Likely secondary to malignancy.   Debility/deconditioning - PT/OT recommending skilled nursing facility on discharge.    Scheduled Meds:  (feeding supplement) PROSource Plus  30 mL Oral BID BM   dexamethasone  2 mg Oral Q12H   docusate sodium  100 mg Oral BID   feeding supplement  237 mL Oral TID BM   heparin  5,000 Units Subcutaneous Q8H   levETIRAcetam  500 mg Oral BID   pantoprazole  40 mg Oral QHS   Continuous Infusions:  methocarbamol (ROBAXIN) IV     PRN Meds:.acetaminophen **OR** [DISCONTINUED] acetaminophen, alum & mag hydroxide-simeth, bisacodyl, LORazepam, LORazepam, methocarbamol **OR** methocarbamol (ROBAXIN) IV, ondansetron **OR** ondansetron (ZOFRAN) IV, polyethylene glycol, sodium phosphate  Diet Orders (From admission, onward)     Start     Ordered   06/07/21 1612  Diet regular Room service appropriate? No; Fluid consistency: Thin  Diet effective now       Comments: Magic cup TID with meals  Question Answer Comment  Room service appropriate? No   Fluid consistency: Thin      06/07/21 1613            DVT prophylaxis: heparin injection 5,000 Units Start: 05/27/21 1430 SCD's Start: 05/26/21 9735  Code Status: DNR  Family Communication: no family present  Status is: Inpatient  Remains inpatient appropriate because:Inpatient level of care appropriate due to severity of illness  Dispo: The patient is from: Home               Anticipated d/c is to: SNF              Patient currently is not medically stable to d/c.   Difficult to place patient Yes  Level of care: Med-Surg  Consultants:  Oncology  Radiation oncology Pulmonary Palliative care Neurosurgery  Procedures:  XRT Laminectomy, decompressive spine surgery  Microbiology  none  Antimicrobials: none    Objective: Vitals:   06/21/21 1600 06/21/21 1941 06/21/21 2359 06/22/21 0635  BP: 104/78 104/78 115/87 116/80  Pulse: (!) 102 (!) 107 (!) 101 (!) 108  Resp: 16 20 18 20   Temp: 98.1 F (36.7 C) 98.8 F (37.1 C) 97.8 F (36.6 C) (!) 97.4 F (36.3 C)  TempSrc: Oral Oral    SpO2: 94% 96% 99% 100%  Weight:      Height:        Intake/Output Summary (Last 24 hours) at 06/22/2021 0958 Last data filed at 06/22/2021 0911 Gross per 24 hour  Intake 118 ml  Output 0 ml  Net 118 ml    Filed Weights   05/24/21 2208 05/25/21 0601 05/25/21 1737  Weight: 46.1 kg 46.1 kg 46.1 kg    Examination:  Constitutional: He is in no distress  Data Reviewed: I have independently reviewed following labs and imaging studies   CBC: Recent Labs  Lab 06/21/21 0438  WBC 11.5*  HGB 11.5*  HCT 37.2*  MCV 91.4  PLT 500    Basic Metabolic Panel: Recent Labs  Lab 06/21/21 0438  NA 138  K 4.3  CL 99  CO2 29  GLUCOSE 101*  BUN 22*  CREATININE 0.67  CALCIUM 9.6    Liver Function Tests: Recent Labs  Lab 06/21/21 0438  AST 16  ALT 24  ALKPHOS 103  BILITOT 0.5  PROT 7.4  ALBUMIN 2.5*    Coagulation Profile: No results for input(s): INR, PROTIME in the last 168 hours. HbA1C: No results for input(s): HGBA1C in the last 72 hours. CBG: Recent Labs  Lab 06/21/21 0631 06/21/21 0751 06/21/21 1140 06/21/21 1657 06/22/21 0656  GLUCAP 94 101* 143* 113* 109*     No results found for this or any previous visit (from the past 240 hour(s)).   Radiology Studies: No results found.  Marzetta Board, MD, PhD [facility]  Between  7 am - 7 pm I am available, please contact me via Amion (for emergencies) or Securechat (non urgent messages)  Between 7 pm - 7 am I am not available, please contact night coverage MD/APP via Amion

## 2021-06-22 NOTE — Progress Notes (Signed)
Occupational Therapy Treatment Patient Details Name: Dylan Shaw MRN: [id number] DOB: 02-Feb-1966 Today's Date: 06/22/2021    History of present illness 55 y.o. man w/ new metastatic disease s/p thoracolumbar decompression and fusion on 05/25/21. Now w/ expressive aphasia 5/29 , self-limited, likely focal seizure. 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 smoker, 50 pound weight loss   OT comments  Patient unable to demonstrate any progress this session, but has continued to mobilize once OOB with Min guard to supervision for standing and transfers.  Due to pt's impaired cognition and aphasia pt is inconsistent with his ADL function and required increased assistance for peri care after incontinent of bowel in bed as well as increased assistance with bed mobility today.  Patient also limited by impaired ability to express his wants/needs and has not been feeding himself with need of close constant supervision and cues as well as assist ranging from verbal cues to Total assist eat, along with deficits noted below. Pt continues to demonstrate fair rehab potential, but may need to consider reassessing pt's possible benefit of OT intervention when time to update POC.     Follow Up Recommendations  SNF    Equipment Recommendations  3 in 1 bedside commode    Recommendations for Other Services      Precautions / Restrictions Precautions Precautions: Back;Fall Precaution Comments: Viewed incisions on back today and noted small amount of pus from one area. RN notifed and placed soft foam bandages. Restrictions Weight Bearing Restrictions: No       Mobility Bed Mobility Overal bed mobility: Needs Assistance Bed Mobility: Sidelying to Sit (x2)   Sidelying to sit: Mod assist   Sit to supine: Supervision;HOB elevated        Transfers Overall transfer level: Needs assistance   Transfers: Sit to/from Stand;Stand Pivot Transfers Sit to Stand: Supervision;Min guard          General transfer comment: Pt stood from EOB with Min guard and required superviison to Min guard while standing without AD. Pt pivoted to recliner with Min guard (used more as tactile cuing to support verbal cues).    Balance Overall balance assessment: Needs assistance Sitting-balance support: Feet supported Sitting balance-Leahy Scale: Good Sitting balance - Comments: Impulsive and needs supervision.   Standing balance support: No upper extremity supported Standing balance-Leahy Scale: Fair Standing balance comment: Impulsive and does not anticipate fall risks.                           ADL either performed or assessed with clinical judgement   ADL Overall ADL's : Needs assistance/impaired   Eating/Feeding Details (indicate cue type and reason): Inconsistent needs. Pt is unable to be left with a food tray and per NR has been saying "No" to food but will eat when assisted. Pt receviing total assist from RN for feeding in bed as OT entered. Once in chair, pt able to grasp milk and drink but dripping down mouth and needed towel placement. Grooming: Wash/dry Tourist information centre manager Details (indicate cue type and reason): Pt cues to wash hands and face while sitting EOB with setup/supervision/cues.       Lower Body Bathing Details (indicate cue type and reason): see toileting Upper Body Dressing : Moderate assistance Upper Body Dressing Details (indicate cue type and reason): cues to raise UEs to assist with threading sleeves while up in chair, donning fresh hospital gown.  Toileting- Clothing Manipulation and Hygiene: Total assistance Toileting - Clothing Manipulation Details (indicate cue type and reason): Pt found to be soiled once standing. Pt required Total assist for peri care/hygiene while standing with close supervision.     Functional mobility during ADLs: Min guard;Supervision/safety       Vision   Additional Comments: Difficult to  assess due to aphasia, however pt did reach and grasp milk carton when placed in front of him on table.   Perception     Praxis      Cognition Arousal/Alertness: Awake/alert Behavior During Therapy: Flat affect Overall Cognitive Status: Impaired/Different from baseline Area of Impairment: Safety/judgement;Problem solving                 Orientation Level: Disoriented to;Place;Situation;Time (Stated "Obama" as POTUS)           Problem Solving: Slow processing;Decreased initiation General Comments: Pt [employer] only today. Given 3 choices on "place" and stated "hotel". When asked where he lives, pt reported "[place]. No [place]". Pt given a break, then asked question again and pt answered, "[place], [place]." Asked pt what part of [place], and pt reported "[place]."        Exercises     Shoulder Instructions       General Comments      Pertinent Vitals/ Pain       Pain Assessment: Faces Faces Pain Scale: Hurts whole lot Pain Location: back during bed mobility Pain Descriptors / Indicators: Moaning;Grimacing;Guarding (cursing) Pain Intervention(s): Limited activity within patient's tolerance;Monitored during session;Repositioned  Home Living                                          Prior Functioning/Environment              Frequency  Min 2X/week        Progress Toward Goals  OT Goals(current goals can now be found in the care plan section)  Progress towards OT goals: Not progressing toward goals - comment  Acute Rehab OT Goals OT Goal Formulation: Patient unable to participate in goal setting Time For Goal Achievement: 07/02/21 Potential to Achieve Goals: [place] Discharge plan remains appropriate    Co-evaluation                 AM-PAC OT "6 Clicks" Daily Activity     Outcome Measure   Help from another person eating meals?: A Lot Help from another person taking care of personal grooming?: A  Little Help from another person toileting, which includes using toliet, bedpan, or urinal?: Total Help from another person bathing (including washing, rinsing, drying)?: A Little Help from another person to put on and taking off regular upper body clothing?: A Little Help from another person to put on and taking off regular lower body clothing?: A Little 6 Click Score: 15    End of Session Equipment Utilized During Treatment: Gait belt  OT Visit Diagnosis: Unsteadiness on feet (R26.81);Muscle weakness (generalized) (M62.81);Cognitive communication deficit (R41.841)   Activity Tolerance Patient tolerated treatment well;Patient limited by pain   Patient Left in chair;with call bell/phone within reach;with chair alarm set   Nurse Communication  (Rn present in room for majority of session and aware of pt in chair.)        Time: 1255-1320 OT Time Calculation (min): 25 min  Charges: OT General Charges $OT Visit: 1 Visit  OT Treatments $Self Care/Home Management : 8-22 mins $Therapeutic Activity: 8-22 mins  Anderson Malta, OT Acute Rehab Services Office: [phone] 06/22/2021   Julien Girt 06/22/2021, 2:03 PM

## 2021-06-23 LAB — GLUCOSE, CAPILLARY
Glucose-Capillary: 108 mg/dL — ABNORMAL HIGH (ref 70–99)
Glucose-Capillary: 179 mg/dL — ABNORMAL HIGH (ref 70–99)
Glucose-Capillary: 126 mg/dL — ABNORMAL HIGH (ref 70–99)

## 2021-06-23 NOTE — Progress Notes (Signed)
PROGRESS NOTE  Dylan Shaw VQQ:[id number] DOB: [birth year] DOA: 05/24/2021 PCP: Patient, No Pcp Per (Inactive)   LOS: 30 days   Brief Narrative / Interim history: 55 years old male with history of cigarette smoking presented to hospital with significant weight loss fatigue low back pain.  He was found to have a lung cancer with metastasis to the spine.  He also developed urinary and bowel incontinence.  Neurosurgery was consulted and surgical decompression was performed.  Subsequently, oncology and radiation oncology were consulted.  Surgical pathology showed poorly differentiated lung adenocarcinoma.  Patient had prolonged hospital course complicated by encephalopathy and  intermittent agitation.  Oncology recommended hospice versus palliative whole brain radiation.  Family was interested in proceeding with whole brain radiation and palliative chemotherapy.  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  Patient completed radiation treatment.  Patient underwent laminectomy, thoracolumbar decompression and fusion with resection of mass on 05/25/2021 by neurosurgery.  MRI also showed metastatic brain lesions.   -Continue Decadron and Keppra.  Taper Decadron 2 mg twice daily 6/23, then 2 mg daily up until 7/7 then 2 mg every other day and eventually stop on 7/13 -Palliative care was also consulted.  Family wishes to continue with palliative radiation and chemotherapy as outpatient. -Oncology following, for now awaiting placement and will start potential further treatment once outpatient assuming he improves  Active Problems Metabolic encephalopathy secondary to brain mets, mass-effect, steroid use, seizure with  postictal phase-Improved.  Try to avoid Haldol due to concern for seizures.     Leukocytosis -Mild.  No concern for infection.  On steroids.   Aphasia/focal seizure -EEG was negative for seizure.  Neurology was consulted.  On Keppra at this time.  Has improved at this time,  no further seizures.  Code stroke was called on 05/26/2021.  Negative CT for stroke.   Severe protein calorie moderation-Severe temporal wasting.  BMI 15.9.  Continue dietary supplements.      Tobacco use-Not on any patch at this time.   Normocytic anemia -Likely secondary to malignancy.   Debility/deconditioning - PT/OT recommending skilled nursing facility on discharge.    Scheduled Meds:  (feeding supplement) PROSource Plus  30 mL Oral BID BM   dexamethasone  2 mg Oral Q12H   docusate sodium  100 mg Oral BID   feeding supplement  237 mL Oral TID BM   heparin  5,000 Units Subcutaneous Q8H   levETIRAcetam  500 mg Oral BID   pantoprazole  40 mg Oral QHS   Continuous Infusions:  methocarbamol (ROBAXIN) IV     PRN Meds:.acetaminophen **OR** [DISCONTINUED] acetaminophen, alum & mag hydroxide-simeth, bisacodyl, LORazepam, LORazepam, methocarbamol **OR** methocarbamol (ROBAXIN) IV, ondansetron **OR** ondansetron (ZOFRAN) IV, polyethylene glycol, sodium phosphate  Diet Orders (From admission, onward)     Start     Ordered   06/07/21 1612  Diet regular Room service appropriate? No; Fluid consistency: Thin  Diet effective now       Comments: Magic cup TID with meals  Question Answer Comment  Room service appropriate? No   Fluid consistency: Thin      06/07/21 1613            DVT prophylaxis: heparin injection 5,000 Units Start: 05/27/21 1430 SCD's Start: 05/26/21 4332  Code Status: DNR  Family Communication: no family present  Status is: Inpatient  Remains inpatient appropriate because:Inpatient level of care appropriate due to severity of illness  Dispo: The patient is from: Home               Anticipated d/c is to: SNF              Patient currently is not medically stable to d/c.   Difficult to place patient Yes  Level of care: Med-Surg  Consultants:  Oncology  Radiation oncology Pulmonary Palliative care Neurosurgery  Procedures:  XRT Laminectomy, decompressive spine surgery  Microbiology  none  Antimicrobials: none    Objective: Vitals:   06/22/21 2131 06/22/21 2346 06/23/21 0315 06/23/21 0841  BP: 116/80 109/78 115/79 96/69  Pulse: 99 98 (!) 103 (!) 105  Resp: (!) 24 (!) 21 15   Temp: 97.9 F (36.6 C) 97.9 F (36.6 C) 98.7 F (37.1 C) 98.7 F (37.1 C)  TempSrc: Oral Oral Oral Oral  SpO2: 100% 98% 96% 99%  Weight:      Height:        Intake/Output Summary (Last 24 hours) at 06/23/2021 1011 Last data filed at 06/23/2021 0411 Gross per 24 hour  Intake 193 ml  Output 500 ml  Net -307 ml    Filed Weights   05/24/21 2208 05/25/21 0601 05/25/21 1737  Weight: 46.1 kg 46.1 kg 46.1 kg    Examination:  Constitutional: Cachectic appearing, no apparent distress  Data Reviewed: I have independently reviewed following labs and imaging studies   CBC: Recent Labs  Lab 06/21/21 0438  WBC 11.5*  HGB 11.5*  HCT 37.2*  MCV 91.4  PLT 585    Basic Metabolic Panel: Recent Labs  Lab 06/21/21 0438  NA 138  K 4.3  CL 99  CO2 29  GLUCOSE 101*  BUN 22*  CREATININE 0.67  CALCIUM 9.6    Liver Function Tests: Recent Labs  Lab 06/21/21 0438  AST 16  ALT 24  ALKPHOS 103  BILITOT 0.5  PROT 7.4  ALBUMIN 2.5*    Coagulation Profile: No results for input(s): INR, PROTIME in the last 168 hours. HbA1C: No results for input(s): HGBA1C in the last 72 hours. CBG: Recent Labs  Lab 06/21/21 1657 06/22/21 0656 06/22/21 1040 06/22/21 1657 06/23/21 0620  GLUCAP 113* 109* 209* 143* 108*     No results found for this or any previous visit (from the past 240 hour(s)).   Radiology Studies: No results found.  Marzetta Board, MD,  PhD [facility]  Between 7 am - 7 pm I am available, please contact me via Amion (for emergencies) or Securechat (non urgent messages)  Between 7 pm - 7 am I am not available, please contact night coverage MD/APP via Amion

## 2021-06-24 LAB — GLUCOSE, CAPILLARY
Glucose-Capillary: 156 mg/dL — ABNORMAL HIGH (ref 70–99)
Glucose-Capillary: 167 mg/dL — ABNORMAL HIGH (ref 70–99)

## 2021-06-24 NOTE — Progress Notes (Signed)
Physical Therapy Treatment and Discharge from Acute PT Patient Details Name: Dylan Shaw MRN: [id number] DOB: 04-12-1966 Today's Date: 06/24/2021    History of Present Illness 55 y.o. man w/ new metastatic disease s/p thoracolumbar decompression and fusion on 05/25/21. Now w/ expressive aphasia 5/29 , self-limited, likely focal seizure. 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 smoker, 50 pound weight loss    PT Comments    Pt appears to have declined in regards to cognition and mobility.  Pt hallucinating during session but agreeable for repositioning and changing linen however very weak, fatigues quickly, requires +2 assist, and has no carryover from cues/techniques from previous sessions.  Pt will likely require custodial care upon d/c.  No follow up PT recommend upon d/c as pt not retaining information.  PT to sign off at this time as pt is not progressing towards goals.   Follow Up Recommendations  Other (comment) (recommend custodial care)     Equipment Recommendations  Rolling walker with 5" wheels;3in1 (PT)    Recommendations for Other Services       Precautions / Restrictions Precautions Precautions: Back;Fall Precaution Comments: no carryover for back precautions    Mobility  Bed Mobility Overal bed mobility: Needs Assistance Bed Mobility: Rolling;Sidelying to Sit;Sit to Supine Rolling: Mod assist Sidelying to sit: Max assist     Sit to sidelying: Max assist General bed mobility comments: cues for log roll technique, pt with saturated meplix dressing on lower spine so applied new dressing, also added sacral mepilex over pressure injury (RN notified)    Transfers Overall transfer level: Needs assistance Equipment used: 2 person hand held assist Transfers: Sit to/from Stand Sit to Stand: Max assist         General transfer comment: pt assisted to standing with nurse tech and therapist, required increased assist due to pain and weakness,  changed bed pad, pt took 3-4 unsteady steps up [facility] and then required assist to control descent to bed likely from fatigue and weakness  Ambulation/Gait                 Stairs             Wheelchair Mobility    Modified Rankin (Stroke Patients Only)       Balance                                            Cognition Arousal/Alertness: Awake/alert Behavior During Therapy: Flat affect Overall Cognitive Status: Impaired/Different from baseline Area of Impairment: Safety/judgement;Problem solving                 Orientation Level: Disoriented to;Place;Situation;Time   Memory: Decreased short-term memory;Decreased recall of precautions   Safety/Judgement: Decreased awareness of safety;Decreased awareness of deficits   Problem Solving: Slow processing;Decreased initiation General Comments: pt requires time to follow simple commands, hallucinating today and became a little agitated with cursing but easily redirected with different cue and calm voice      Exercises      General Comments        Pertinent Vitals/Pain Pain Assessment: Faces Faces Pain Scale: Hurts whole lot Pain Location: back during bed mobility Pain Descriptors / Indicators: Moaning;Grimacing;Guarding Pain Intervention(s): Repositioned;Monitored during session    Home Living  Prior Function            PT Goals (current goals can now be found in the care plan section) Progress towards PT goals: Not progressing toward goals - comment    Frequency           PT Plan Other (comment) (pt's cognition and mobility declining, PT to sign off)    Co-evaluation              AM-PAC PT "6 Clicks" Mobility   Outcome Measure  Help needed turning from your back to your side while in a flat bed without using bedrails?: Total Help needed moving from lying on your back to sitting on the side of a flat bed without using bedrails?:  Total Help needed moving to and from a bed to a chair (including a wheelchair)?: Total Help needed standing up from a chair using your arms (e.g., wheelchair or bedside chair)?: Total Help needed to walk in hospital room?: Total Help needed climbing 3-5 steps with a railing? : Total 6 Click Score: 6    End of Session Equipment Utilized During Treatment: Gait belt Activity Tolerance: Other (comment) (decreased cognition, declining mobility) Patient left: with call bell/phone within reach;in bed;with bed alarm set;with nursing/sitter in room Nurse Communication: Mobility status PT Visit Diagnosis: Unsteadiness on feet (R26.81);Other abnormalities of gait and mobility (R26.89)     Time: 8786-7672 PT Time Calculation (min) (ACUTE ONLY): 19 min  Charges:  $Therapeutic Activity: 8-22 mins                    Jannette Spanner PT, DPT Acute Rehabilitation Services Pager: [phone] Office: [phone]  York Ram E 06/24/2021, 1:18 PM

## 2021-06-24 NOTE — Progress Notes (Signed)
Chaplain engaged in an initial visit with Dylan Shaw.  Chaplain checked-in with Dylan Shaw to see how he was feeling and doing.  He stated he was doing "ok."  Chaplain offered support, and will follow-up.    06/24/21 1200  Clinical Encounter Type  Visited With Patient  Visit Type Initial

## 2021-06-24 NOTE — Progress Notes (Signed)
PROGRESS NOTE  Dylan Shaw DGL:[id number] DOB: 09-17-1966 DOA: 05/24/2021 PCP: Patient, No Pcp Per (Inactive)   LOS: 31 days   Brief Narrative / Interim history: 55 years old male with history of cigarette smoking presented to hospital with significant weight loss fatigue low back pain.  He was found to have a lung cancer with metastasis to the spine.  He also developed urinary and bowel incontinence.  Neurosurgery was consulted and surgical decompression was performed.  Subsequently, oncology and radiation oncology were consulted.  Surgical pathology showed poorly differentiated lung adenocarcinoma.  Patient had prolonged hospital course complicated by encephalopathy and  intermittent agitation.  Oncology recommended hospice versus palliative whole brain radiation.  Family was interested in proceeding with whole brain radiation and palliative chemotherapy.  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  Patient completed radiation treatment.  Patient underwent laminectomy, thoracolumbar decompression and fusion with resection of mass on 05/25/2021 by neurosurgery.  MRI also showed metastatic brain lesions.   -Continue Decadron and Keppra.  Taper Decadron 2 mg twice daily 6/23, then 2 mg daily up until 7/7 then 2 mg every other day and eventually stop on 7/13 -Palliative care was also consulted.  Family wishes to continue with palliative radiation and chemotherapy as outpatient. -Oncology following, for now awaiting placement and will start potential further treatment once outpatient assuming he improves  Active Problems Metabolic encephalopathy secondary to brain mets, mass-effect, steroid use, seizure with  postictal phase-Improved.  Try to avoid Haldol due to concern for seizures.     Leukocytosis -Mild.  No concern for infection.  On steroids.   Aphasia/focal seizure -EEG was negative for seizure.  Neurology was consulted.  On Keppra at this time.  Has improved at this time,  no further seizures.  Code stroke was called on 05/26/2021.  Negative CT for stroke.   Severe protein calorie moderation-Severe temporal wasting.  BMI 15.9.  Continue dietary supplements.      Tobacco use-Not on any patch at this time.   Normocytic anemia -Likely secondary to malignancy.   Debility/deconditioning - PT/OT recommending skilled nursing facility on discharge.    Scheduled Meds:  (feeding supplement) PROSource Plus  30 mL Oral BID BM   dexamethasone  2 mg Oral Q12H   docusate sodium  100 mg Oral BID   feeding supplement  237 mL Oral TID BM   heparin  5,000 Units Subcutaneous Q8H   levETIRAcetam  500 mg Oral BID   pantoprazole  40 mg Oral QHS   Continuous Infusions:  methocarbamol (ROBAXIN) IV     PRN Meds:.acetaminophen **OR** [DISCONTINUED] acetaminophen, alum & mag hydroxide-simeth, bisacodyl, LORazepam, LORazepam, methocarbamol **OR** methocarbamol (ROBAXIN) IV, ondansetron **OR** ondansetron (ZOFRAN) IV, polyethylene glycol, sodium phosphate  Diet Orders (From admission, onward)     Start     Ordered   06/07/21 1612  Diet regular Room service appropriate? No; Fluid consistency: Thin  Diet effective now       Comments: Magic cup TID with meals  Question Answer Comment  Room service appropriate? No   Fluid consistency: Thin      06/07/21 1613            DVT prophylaxis: heparin injection 5,000 Units Start: 05/27/21 1430 SCD's Start: 05/26/21 5188  Code Status: DNR  Family Communication: no family present  Status is: Inpatient  Remains inpatient appropriate because:Inpatient level of care appropriate due to severity of illness  Dispo: The patient is from: Home               Anticipated d/c is to: SNF              Patient currently is not medically stable to d/c.   Difficult to place patient Yes  Level of care: Med-Surg  Consultants:  Oncology  Radiation oncology Pulmonary Palliative care Neurosurgery  Procedures:  XRT Laminectomy, decompressive spine surgery  Microbiology  none  Antimicrobials: none    Objective: Vitals:   06/23/21 1120 06/23/21 1457 06/23/21 1954 06/24/21 0431  BP: 118/86 106/84 102/72 105/75  Pulse: (!) 105 (!) 102 (!) 110 (!) 102  Resp:  20 20 17   Temp:  98.7 F (37.1 C) 98.6 F (37 C) 98.2 F (36.8 C)  TempSrc:      SpO2: 93% 100% 98% 96%  Weight:      Height:        Intake/Output Summary (Last 24 hours) at 06/24/2021 0957 Last data filed at 06/24/2021 0435 Gross per 24 hour  Intake 236 ml  Output 925 ml  Net -689 ml    Filed Weights   05/24/21 2208 05/25/21 0601 05/25/21 1737  Weight: 46.1 kg 46.1 kg 46.1 kg    Examination:  Constitutional: Cachectic appearing, no distress  Data Reviewed: I have independently reviewed following labs and imaging studies   CBC: Recent Labs  Lab 06/21/21 0438  WBC 11.5*  HGB 11.5*  HCT 37.2*  MCV 91.4  PLT 149    Basic Metabolic Panel: Recent Labs  Lab 06/21/21 0438  NA 138  K 4.3  CL 99  CO2 29  GLUCOSE 101*  BUN 22*  CREATININE 0.67  CALCIUM 9.6    Liver Function Tests: Recent Labs  Lab 06/21/21 0438  AST 16  ALT 24  ALKPHOS 103  BILITOT 0.5  PROT 7.4  ALBUMIN 2.5*    Coagulation Profile: No results for input(s): INR, PROTIME in the last 168 hours. HbA1C: No results for input(s): HGBA1C in the last 72 hours. CBG: Recent Labs  Lab 06/22/21 1040 06/22/21 1657 06/23/21 0620 06/23/21 1054 06/23/21 1715  GLUCAP 209* 143* 108* 179* 126*     No results found for this or any previous visit (from the past 240 hour(s)).   Radiology Studies: No results found.  Marzetta Board, MD, PhD [facility]  Between 7 am - 7 pm I  am available, please contact me via Amion (for emergencies) or Securechat (non urgent messages)  Between 7 pm - 7 am I am not available, please contact night coverage MD/APP via Amion

## 2021-06-25 ENCOUNTER — Inpatient Hospital Stay (HOSPITAL_COMMUNITY): Payer: 59

## 2021-06-25 LAB — GLUCOSE, CAPILLARY
Glucose-Capillary: 112 mg/dL — ABNORMAL HIGH (ref 70–99)
Glucose-Capillary: 161 mg/dL — ABNORMAL HIGH (ref 70–99)
Glucose-Capillary: 115 mg/dL — ABNORMAL HIGH (ref 70–99)

## 2021-06-25 MED ORDER — FUROSEMIDE 10 MG/ML IJ SOLN
20.0000 mg | Freq: Once | INTRAMUSCULAR | Status: AC
  Administered 2021-06-25: 20 mg via INTRAVENOUS
  Filled 2021-06-25: qty 2

## 2021-06-25 NOTE — Progress Notes (Addendum)
PROGRESS NOTE  Dylan Shaw JXB:[id number] DOB: January 26, 1966 DOA: 05/24/2021 PCP: Patient, No Pcp Per (Inactive)   LOS: 32 days   Brief Narrative / Interim history: 55 years old male with history of cigarette smoking presented to hospital with significant weight loss fatigue low back pain.  He was found to have a lung cancer with metastasis to the spine.  He also developed urinary and bowel incontinence.  Neurosurgery was consulted and surgical decompression was performed.  Subsequently, oncology and radiation oncology were consulted.  Surgical pathology showed poorly differentiated lung adenocarcinoma.  Patient had prolonged hospital course complicated by encephalopathy and  intermittent agitation.  Oncology recommended hospice versus palliative whole brain radiation.  Family was interested in proceeding with whole brain radiation and palliative chemotherapy.  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  Patient completed radiation treatment.  Patient underwent laminectomy, thoracolumbar decompression and fusion with resection of mass on 05/25/2021 by neurosurgery.  MRI also showed metastatic brain lesions.   -Continue Decadron and Keppra.  Taper Decadron 2 mg twice daily 6/23, then 2 mg daily up until 7/7 then 2 mg every other day and eventually stop on 7/13 -Palliative care was also consulted.  Family wishes to continue with palliative radiation and chemotherapy as outpatient. -Oncology following, for now awaiting placement and will start potential further treatment once outpatient assuming he improves but at this point his prognosis is quite poor  Active Problems Metabolic encephalopathy secondary to  brain mets, mass-effect, steroid use, seizure with postictal phase-Improved.  Try to avoid Haldol due to concern for seizures.     Leukocytosis -Mild.  No concern for infection.  On steroids.   Aphasia/focal seizure -EEG was negative for seizure.  Neurology was consulted.  On Keppra at this time.  Has improved at this time,  no further seizures.  Code stroke was called on 05/26/2021.  Negative CT for stroke.   Severe protein calorie moderation-Severe temporal wasting.  BMI 15.9.  Continue dietary supplements.      Tobacco use-Not on any patch at this time.   Normocytic anemia -Likely secondary to malignancy.   Debility/deconditioning - PT/OT recommending skilled nursing facility on discharge.    Scheduled Meds:  (feeding supplement) PROSource Plus  30 mL Oral BID BM   dexamethasone  2 mg Oral Q12H   docusate sodium  100 mg Oral BID   feeding supplement  237 mL Oral TID BM   heparin  5,000 Units Subcutaneous Q8H   levETIRAcetam  500 mg Oral BID   pantoprazole  40 mg Oral QHS   Continuous Infusions:  methocarbamol (ROBAXIN) IV     PRN Meds:.acetaminophen **OR** [DISCONTINUED] acetaminophen, alum & mag hydroxide-simeth, bisacodyl, LORazepam, LORazepam, methocarbamol **OR** methocarbamol (ROBAXIN) IV, ondansetron **OR** ondansetron (ZOFRAN) IV, polyethylene glycol, sodium phosphate  Diet Orders (From admission, onward)     Start     Ordered   06/07/21 1612  Diet regular Room service appropriate? No; Fluid consistency: Thin  Diet effective now       Comments: Magic cup TID with meals  Question Answer Comment  Room service appropriate? No   Fluid consistency: Thin      06/07/21 1613            DVT prophylaxis: heparin injection 5,000  Units Start: 05/27/21 1430 SCD's Start: 05/26/21 0412     Code Status: DNR  Family Communication: no family present  Status is: Inpatient  Remains inpatient appropriate because:Inpatient level of care appropriate due to severity of  illness  Dispo: The patient is from: Home              Anticipated d/c is to: SNF              Patient currently is not medically stable to d/c.   Difficult to place patient Yes  Level of care: Med-Surg  Consultants:  Oncology  Radiation oncology Pulmonary Palliative care Neurosurgery  Procedures:  XRT Laminectomy, decompressive spine surgery  Microbiology  none  Antimicrobials: none    Objective: Vitals:   06/24/21 0431 06/24/21 1443 06/24/21 2030 06/25/21 0525  BP: 105/75 104/73 106/70 105/88  Pulse: (!) 102 (!) 102 (!) 108 (!) 103  Resp: 17 16 18 18   Temp: 98.2 F (36.8 C) 98.2 F (36.8 C) 97.8 F (36.6 C) (!) 97.3 F (36.3 C)  TempSrc:   Oral Oral  SpO2: 96% 94% 93% 96%  Weight:      Height:        Intake/Output Summary (Last 24 hours) at 06/25/2021 1051 Last data filed at 06/24/2021 2150 Gross per 24 hour  Intake 951 ml  Output 975 ml  Net -24 ml    Filed Weights   05/24/21 2208 05/25/21 0601 05/25/21 1737  Weight: 46.1 kg 46.1 kg 46.1 kg    Examination:  Constitutional: NAD, cachectic appearing male Respiratory: clear to auscultation bilaterally, no wheezing, no crackles. Normal respiratory effort.  Cardiovascular: Regular rate and rhythm, no murmurs / rubs / gallops. No LE edema.   Data Reviewed: I have independently reviewed following labs and imaging studies   CBC: Recent Labs  Lab 06/21/21 0438  WBC 11.5*  HGB 11.5*  HCT 37.2*  MCV 91.4  PLT 944    Basic Metabolic Panel: Recent Labs  Lab 06/21/21 0438  NA 138  K 4.3  CL 99  CO2 29  GLUCOSE 101*  BUN 22*  CREATININE 0.67  CALCIUM 9.6    Liver Function Tests: Recent Labs  Lab 06/21/21 0438  AST 16  ALT 24  ALKPHOS 103  BILITOT 0.5  PROT 7.4  ALBUMIN 2.5*    Coagulation Profile: No results for input(s): INR, PROTIME in the last 168 hours. HbA1C: No results for input(s): HGBA1C in the last 72 hours. CBG: Recent Labs  Lab 06/23/21 1054 06/23/21 1715  06/24/21 1133 06/24/21 1717 06/25/21 0626  GLUCAP 179* 126* 156* 167* 112*     No results found for this or any previous visit (from the past 240 hour(s)).   Radiology Studies: No results found.  Marzetta Board, MD, PhD [facility]  Between 7 am - 7 pm I am available, please contact me via Amion (for emergencies) or Securechat (non urgent messages)  Between 7 pm - 7 am I am not available, please contact night coverage MD/APP via Amion

## 2021-06-25 NOTE — Progress Notes (Signed)
   06/25/21 2004  Assess: MEWS Score  Temp 98.8 F (37.1 C)  BP 101/72  Pulse Rate (!) 115  Resp 18  SpO2 97 %  O2 Device Room Air  Assess: MEWS Score  MEWS Temp 0  MEWS Systolic 0  MEWS Pulse 2  MEWS RR 0  MEWS LOC 0  MEWS Score 2  MEWS Score Color Yellow  Treat  Pain Scale 0-10  Pain Score 0  Notify: Charge Nurse/RN  Name of Charge Nurse/RN Notified Tom, RN  Date Charge Nurse/RN Notified 06/25/21  Time Charge Nurse/RN Notified 2005  Document  Patient Outcome Stabilized after interventions  Progress note created (see row info) Yes  Assess: SIRS CRITERIA  SIRS Temperature  0  SIRS Pulse 1  SIRS Respirations  0  SIRS WBC 0  SIRS Score Sum  1

## 2021-06-26 DIAGNOSIS — R0602 Shortness of breath: Secondary | ICD-10-CM

## 2021-06-26 LAB — BRAIN NATRIURETIC PEPTIDE: B Natriuretic Peptide: 96 pg/mL (ref 0.0–100.0)

## 2021-06-26 LAB — GLUCOSE, CAPILLARY
Glucose-Capillary: 75 mg/dL (ref 70–99)
Glucose-Capillary: 180 mg/dL — ABNORMAL HIGH (ref 70–99)

## 2021-06-26 MED ORDER — BIOTENE DRY MOUTH MT LIQD: 15.0000 mL | OROMUCOSAL | Status: DC | PRN

## 2021-06-26 MED ORDER — ACETAMINOPHEN 325 MG PO TABS: 650.0000 mg | ORAL_TABLET | Freq: Four times a day (QID) | ORAL | Status: DC | PRN

## 2021-06-26 MED ORDER — GLYCOPYRROLATE 0.2 MG/ML IJ SOLN: 0.2000 mg | INTRAMUSCULAR | Status: DC | PRN

## 2021-06-26 MED ORDER — SODIUM CHLORIDE 0.9 % IV BOLUS
500.0000 mL | Freq: Once | INTRAVENOUS | Status: AC
  Administered 2021-06-26: 500 mL via INTRAVENOUS

## 2021-06-26 MED ORDER — HALOPERIDOL LACTATE 5 MG/ML IJ SOLN: 0.5000 mg | INTRAMUSCULAR | Status: DC | PRN

## 2021-06-26 MED ORDER — ACETAMINOPHEN 650 MG RE SUPP: 650.0000 mg | Freq: Four times a day (QID) | RECTAL | Status: DC | PRN

## 2021-06-26 MED ORDER — LORAZEPAM 2 MG/ML PO CONC
1.0000 mg | ORAL | Status: DC | PRN
  Filled 2021-06-26: qty 0.5

## 2021-06-26 MED ORDER — LORAZEPAM 2 MG/ML IJ SOLN: 1.0000 mg | INTRAMUSCULAR | Status: DC | PRN

## 2021-06-26 MED ORDER — MORPHINE SULFATE (PF) 2 MG/ML IV SOLN
2.0000 mg | INTRAVENOUS | Status: DC
  Administered 2021-06-26 – 2021-06-28 (×12): 2 mg via INTRAVENOUS
  Filled 2021-06-26 (×12): qty 1

## 2021-06-26 MED ORDER — GLYCOPYRROLATE 1 MG PO TABS
1.0000 mg | ORAL_TABLET | ORAL | Status: DC | PRN
  Filled 2021-06-26: qty 1

## 2021-06-26 MED ORDER — HALOPERIDOL LACTATE 2 MG/ML PO CONC
0.5000 mg | ORAL | Status: DC | PRN
  Filled 2021-06-26: qty 0.3

## 2021-06-26 MED ORDER — ONDANSETRON HCL 4 MG/2ML IJ SOLN: 4.0000 mg | Freq: Four times a day (QID) | INTRAMUSCULAR | Status: DC | PRN

## 2021-06-26 MED ORDER — POLYVINYL ALCOHOL 1.4 % OP SOLN
1.0000 [drp] | Freq: Four times a day (QID) | OPHTHALMIC | Status: DC | PRN
  Filled 2021-06-26: qty 15

## 2021-06-26 MED ORDER — ONDANSETRON 4 MG PO TBDP: 4.0000 mg | ORAL_TABLET | Freq: Four times a day (QID) | ORAL | Status: DC | PRN

## 2021-06-26 MED ORDER — LORAZEPAM 1 MG PO TABS: 1.0000 mg | ORAL_TABLET | ORAL | Status: DC | PRN

## 2021-06-26 MED ORDER — HALOPERIDOL 0.5 MG PO TABS
0.5000 mg | ORAL_TABLET | ORAL | Status: DC | PRN
  Filled 2021-06-26: qty 1

## 2021-06-26 MED ORDER — MORPHINE SULFATE (PF) 2 MG/ML IV SOLN: 2.0000 mg | INTRAVENOUS | Status: DC | PRN

## 2021-06-26 NOTE — Progress Notes (Signed)
Nutrition Follow-up  DOCUMENTATION CODES:   Underweight, Severe malnutrition in context of chronic illness  INTERVENTION:   -Continue Prosource Plus PO BID, each provides 100 kcals and 15g protein  -Continue Ensure Enlive po TID, each supplement provides 350 kcal and 20 grams of protein  -Continue Magic cup BID with meals, each supplement provides 290 kcal and 9 grams of protein  NUTRITION DIAGNOSIS:   Severe Malnutrition related to chronic illness (lung cancer with metastasis to spine and brain) as evidenced by severe muscle depletion, severe fat depletion.  Ongoing.  GOAL:   Patient will meet greater than or equal to 90% of their needs  Progressing.  MONITOR:   PO intake, Supplement acceptance, Weight trends, Labs, I & O's  ASSESSMENT:   Patient with PMH significant for tobacco use. Presents this admission with T12 metastatic lesion with epidural extension (presumed lung primary).  Patient currently consuming 0-100% of meals. Is drinking supplements. Palliative continues to have [facility] discussions with patient.   Per MD note, plan is still for SNF at discharge.  No updated weight since 5/28.  I/Os: +2.6L since 6/15  Medications: Colace  Labs reviewed: CBGs: 75-161  Diet Order:   Diet Order             Diet regular Room service appropriate? No; Fluid consistency: Thin  Diet effective now                   EDUCATION NEEDS:   Not appropriate for education at this time  Skin:  Skin Assessment: Skin Integrity Issues: Skin Integrity Issues:: Stage II Stage II: medial coccyx Incisions: back  Last BM:  6/29 -type 5  Height:   Ht Readings from Last 1 Encounters:  05/25/21 5\' 7"  (1.702 m)    Weight:   Wt Readings from Last 1 Encounters:  05/25/21 46.1 kg   BMI:  Body mass index is 15.92 kg/m.  Estimated Nutritional Needs:   Kcal:  1500-1700 kcal  Protein:  75-90 grams  Fluid:  >/= 1.5 L/day  Clayton Bibles, MS, RD, LDN Inpatient  Clinical Dietitian Contact information available via Amion

## 2021-06-26 NOTE — Progress Notes (Signed)
Daily Progress Note   Patient Name: Dylan Shaw       Date: 06/26/2021 DOB: September 10, 1966  Age: 55 y.o. MRN#: [id number] Attending Physician: Aline August, MD Primary Care Physician: Patient, No Pcp Per (Inactive) Admit Date: 05/24/2021  Reason for Consultation/Follow-up: Establishing goals of care  Subjective: Discussed with Dr. Starla Link as well as bedside care team.  Dylan Shaw continues to decline and he is more lethargic and less interactive today.  I called and was able to reach his brother. We discussed clinical course as well as wishes moving forward in regard to care this hospitalization.  We discussed difference between a aggressive medical intervention path and a palliative, comfort focused care path.  I shared concern that he is continuing to decline despite best interventions and I am worried that he is going to die regardless of interventions moving forward.  His brother stated that he would want to be comfortable and not suffer if this is the case.  We discussed plan for focus on comfort and considering residential hospice placement if he continues to decline over the next couple of days.  Questions and concerns addressed.   PMT will continue to support holistically.  Length of Stay: 33  Current Medications: Scheduled Meds:   dexamethasone  2 mg Oral Q12H   feeding supplement  237 mL Oral TID BM   heparin  5,000 Units Subcutaneous Q8H   levETIRAcetam  500 mg Oral BID    morphine injection  2 mg Intravenous Q4H    Continuous Infusions:  methocarbamol (ROBAXIN) IV      PRN Meds: acetaminophen **OR** acetaminophen, alum & mag hydroxide-simeth, antiseptic oral rinse, bisacodyl, glycopyrrolate **OR** glycopyrrolate **OR** glycopyrrolate, haloperidol **OR** haloperidol **OR**  haloperidol lactate, LORazepam **OR** LORazepam **OR** LORazepam, [DISCONTINUED] methocarbamol **OR** methocarbamol (ROBAXIN) IV, morphine injection, ondansetron **OR** ondansetron (ZOFRAN) IV, [DISCONTINUED] ondansetron **OR** ondansetron (ZOFRAN) IV, polyethylene glycol, polyvinyl alcohol, sodium phosphate  Physical Exam         More somnolent today.  Not very interactive. Frail No distress Regular work of breathing, coarse throughout S 1 S 2  Abdomen is not distended No edema  Vital Signs: BP 117/80 (BP Location: Right Arm)   Pulse (!) 113   Temp 98.8 F (37.1 C) (Oral)   Resp (!) 30   Ht 5\' 7"  (1.702 m)  Wt 46.1 kg   SpO2 94%   BMI 15.92 kg/m  SpO2: SpO2: 94 % O2 Device: O2 Device: Room Air O2 Flow Rate: O2 Flow Rate (L/min): 2 L/min  Intake/output summary:  Intake/Output Summary (Last 24 hours) at 06/26/2021 1906 Last data filed at 06/26/2021 1858 Gross per 24 hour  Intake 1417 ml  Output --  Net 1417 ml    LBM: Last BM Date: 06/25/21 Baseline Weight: Weight: 59 kg Most recent weight: Weight: 46.1 kg PPS 50%      Palliative Assessment/Data:      Patient Active Problem List   Diagnosis Date Noted   Palliative care by specialist    Goals of care, counseling/discussion    Protein-calorie malnutrition, severe 05/30/2021   Brain metastasis ([place]) 05/26/2021   Metastatic cancer to spine ([place]) 05/25/2021   Thoracic spine tumor 05/24/2021   Lung cancer ([place]) 05/24/2021   Protein calorie malnutrition ([place]) 05/24/2021    Palliative Care Assessment & Plan   Patient Profile:    Assessment:  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   Oncology/radiation oncology, Pulmonology, neurosurgery were all consulted.  Patient is undergoing radiation treatment.  Patient underwent laminectomy, thoracolumbar decompression and fusion with resection of mass on 05/25/2021 by neurosurgery.  MRI also showed metastatic  brain lesions.   -Continue Decadron and Keppra.   -Palliative care was consulted.  Family wishes to continue with palliative radiation and chemotherapy as outpatient.  Recommendations/Plan: DNR/DNI Focus on comfort moving forward.   Pain/SOB: He has some increased work of breathing.  We will plan for addition of low-dose scheduled morphine as well as additional as needed morphine if needed for any pain or shortness of breath. Anxiety: Ativan as needed Agitation: Haldol as needed Excess secretions: Robinul as needed Continue to follow clinical course closely with consideration for transition to residential hospice in the next day or 2 if he continues to decline clinically but remains stable enough for transport.    Code Status:    Code Status Orders  (From admission, onward)           Start     Ordered   05/28/21 1620  Do not attempt resuscitation (DNR)  Continuous       Question Answer Comment  In the event of cardiac or respiratory ARREST Do not call a "code blue"   In the event of cardiac or respiratory ARREST Do not perform Intubation, CPR, defibrillation or ACLS   In the event of cardiac or respiratory ARREST Use medication by any route, position, wound care, and other measures to relive pain and suffering. May use oxygen, suction and manual treatment of airway obstruction as needed for comfort.      05/28/21 1619           Code Status History     Date Active Date Inactive Code Status Order ID Comments User Context   05/24/2021 2146 05/28/2021 1619 Full Code [id number]  Elgergawy, Silver Huguenin, MD ED       Prognosis:  Unable to determine-he continues to decline and focus is now on main goal of comfort moving forward.  If he continues to decline, his prognosis is likely less than 2 weeks and he would be well served by residential hospice placement for end-of-life care.  Discharge Planning: To Be Determined  Thank you for allowing the Palliative Medicine Team to assist  in the care of this patient.   Time In: 1345  Time Out: 1430 Total Time 45 Prolonged Time Billed  no   Greater than 50%  of this time was spent counseling and coordinating care related to the above assessment and plan.    Micheline Rough, MD  Please contact Palliative Medicine Team phone at [phone] for questions and concerns.

## 2021-06-26 NOTE — Progress Notes (Signed)
Patient ID: Dylan Shaw, male   DOB: [birth year], 55 y.o.   MRN: [id number]  PROGRESS NOTE    Dylan Shaw  XVQ:[id number] DOB: March 11, 1966 DOA: 05/24/2021 PCP: Patient, No Pcp Per (Inactive)   Brief Narrative:  55 years old male with history of cigarette smoking presented to hospital with significant weight loss fatigue low back pain.  He was found to have a lung cancer with metastasis to the spine.  He also developed urinary and bowel incontinence.  Neurosurgery was consulted and surgical decompression was performed.  Subsequently, oncology and radiation oncology were consulted.  Surgical pathology showed poorly differentiated lung adenocarcinoma.  Patient had prolonged hospital course complicated by encephalopathy and  intermittent agitation.  Oncology recommended hospice versus palliative whole brain radiation.  Family was interested in proceeding with whole brain radiation and palliative chemotherapy.  At this time, patient is waiting for skilled nursing facility. 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   Oncology/radiation oncology, Pulmonology, neurosurgery were all consulted.  Patient is undergoing radiation treatment.  Patient underwent laminectomy, thoracolumbar decompression and fusion with resection of mass on 05/25/2021 by neurosurgery.  MRI also showed metastatic brain lesions.   -Continue Decadron and Keppra. Taper Decadron 2 mg twice daily 6/23, then 2 mg daily up until 7/7 then 2 mg every other day and eventually stop on 7/13 -Palliative care following: Family wishes to continue with palliative radiation and chemotherapy as outpatient.   -Oncology following: for now awaiting placement and will start potential further treatment once outpatient assuming he improves but at this point his prognosis is quite poor   Metabolic encephalopathy secondary to brain mets, mass-effect, steroid use, seizure with  postictal phase. -Improved.  Try to avoid Haldol due to concern for seizures.     Leukocytosis Mild.  No concern for infection.  On steroids.   Aphasia/focal seizure     -EEG was negative for seizure.  Neurology was consulted.  On Keppra at this time.  Has improved at this time,  no further seizures.  Code stroke was called on 05/26/2021.  Negative CT for stroke.   Severe protein calorie moderation Severe temporal wasting.  BMI 15.9.  Continue dietary supplements.     Tobacco use    Not on any patch at this time.   Normocytic anemia Likely secondary to malignancy.   Debility/deconditioning     - PT/OT recommending skilled nursing facility on discharge.     DVT prophylaxis: heparin subcu   Code Status: DNR   Family Communication:  None   Status is: Inpatient   Remains inpatient appropriate because:Unsafe d/c plan, awaiting for skilled nursing facility placement.   Dispo: The patient is from: Home              Anticipated d/c is to: SNF              Patient currently is medically stable to d/c.              Difficult to place patient Yes     Consultants:  Neurosurgery [name] Palliative care Critical care   Procedures:  XRT Laminectomy, decompressive spine surgery.   Antimicrobials: None currently  Subjective: Patient seen and examined at bedside.  Poor historian.  No overnight fever, vomiting, seizures reported.  Objective: Vitals:   06/26/21 1000 06/26/21 1017 06/26/21 1110 06/26/21 1238  BP: 98/74 120/85 135/80 118/84  Pulse: (!) 113 (!)  104 (!) 115 (!) 113  Resp: (!) 24  (!) 22 20  Temp: 98 F (36.7 C)  98.1 F (36.7 C) 98.1 F (36.7 C)  TempSrc: Oral  Oral Oral  SpO2: (!) 87%  96% 95%  Weight:      Height:        Intake/Output Summary (Last 24 hours) at 06/26/2021 1310 Last data filed at 06/26/2021 1254 Gross per 24 hour  Intake 1417 ml  Output --  Net 1417 ml   Filed Weights   05/24/21 2208 05/25/21 0601 05/25/21 1737   Weight: 46.1 kg 46.1 kg 46.1 kg    Examination:  General exam: Appears calm and comfortable.  Looks older than stated age; very deconditioned and cachectic.  Poor historian. Respiratory system: Bilateral decreased breath sounds at bases with some scattered crackles.  Intermittently tachypneic Cardiovascular system: S1 & S2 heard; tachycardic  gastrointestinal system: Abdomen is nondistended, soft and nontender. Normal bowel sounds heard. Extremities: Trace lower extremity edema present; no clubbing  Data Reviewed: I have personally reviewed following labs and imaging studies  CBC: Recent Labs  Lab 06/21/21 0438  WBC 11.5*  HGB 11.5*  HCT 37.2*  MCV 91.4  PLT 409   Basic Metabolic Panel: Recent Labs  Lab 06/21/21 0438  NA 138  K 4.3  CL 99  CO2 29  GLUCOSE 101*  BUN 22*  CREATININE 0.67  CALCIUM 9.6   GFR: Estimated Creatinine Clearance: 69.6 mL/min (by C-G formula based on SCr of 0.67 mg/dL). Liver Function Tests: Recent Labs  Lab 06/21/21 0438  AST 16  ALT 24  ALKPHOS 103  BILITOT 0.5  PROT 7.4  ALBUMIN 2.5*   No results for input(s): LIPASE, AMYLASE in the last 168 hours. No results for input(s): AMMONIA in the last 168 hours. Coagulation Profile: No results for input(s): INR, PROTIME in the last 168 hours. Cardiac Enzymes: No results for input(s): CKTOTAL, CKMB, CKMBINDEX, TROPONINI in the last 168 hours. BNP (last 3 results) No results for input(s): PROBNP in the last 8760 hours. HbA1C: No results for input(s): HGBA1C in the last 72 hours. CBG: Recent Labs  Lab 06/25/21 0626 06/25/21 1131 06/25/21 1637 06/26/21 0627 06/26/21 1131  GLUCAP 112* 161* 115* 75 180*   Lipid Profile: No results for input(s): CHOL, HDL, LDLCALC, TRIG, CHOLHDL, LDLDIRECT in the last 72 hours. Thyroid Function Tests: No results for input(s): TSH, T4TOTAL, FREET4, T3FREE, THYROIDAB in the last 72 hours. Anemia Panel: No results for input(s): VITAMINB12, FOLATE,  FERRITIN, TIBC, IRON, RETICCTPCT in the last 72 hours. Sepsis Labs: No results for input(s): PROCALCITON, LATICACIDVEN in the last 168 hours.  No results found for this or any previous visit (from the past 240 hour(s)).       Radiology Studies: DG CHEST PORT 1 VIEW  Result Date: 06/25/2021 CLINICAL DATA:  Shortness of breath, history of lung carcinoma EXAM: PORTABLE CHEST 1 VIEW COMPARISON:  05/24/2021 FINDINGS: Cardiac shadow is stable. Postsurgical changes are seen in the lower thoracic spine consistent with recent surgical history. Left lung is clear. Right lung shows diffuse increased density consistent with the patient's known lung masses. Rib fractures are noted with healing. No acute bony abnormality is seen. No pneumothorax is noted IMPRESSION: Postsurgical changes in the thoracolumbar spine. Fullness in the right hilum and increased density in the right lung base consistent with the known history of lung carcinoma similar to that seen on prior CT. Electronically Signed   By: Linus Mako.D.  On: 06/25/2021 16:21        Scheduled Meds:  (feeding supplement) PROSource Plus  30 mL Oral BID BM   dexamethasone  2 mg Oral Q12H   docusate sodium  100 mg Oral BID   feeding supplement  237 mL Oral TID BM   heparin  5,000 Units Subcutaneous Q8H   levETIRAcetam  500 mg Oral BID   pantoprazole  40 mg Oral QHS   Continuous Infusions:  methocarbamol (ROBAXIN) IV            Aline August, MD [facility] 06/26/2021, 1:10 PM

## 2021-06-26 NOTE — Progress Notes (Signed)
                                                                                                                                                             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  Intent: Palliative  Radiation Treatment Dates: 06/03/2021 through 06/14/2021 Site Technique Total Dose (Gy) Dose per Fx (Gy) Completed Fx Beam Energies  Brain: Brain Complex 30/30 3 10/10 6X   Narrative: The patient tolerated radiation therapy relatively well. His brother is his next of kin and requested that we continue radiotherapy during his hospital course. Steroid taper instructions were given at the conclusion of treatment.  Plan: We will recheck with he and his brother in a few weeks to see how he's proceeding and to determine if he will proceed with palliative radiotherapy to the spine as well, but if his clinical condition worsens, he will likely proceed with hospice care. The patient will receive a call in about one month from the radiation oncology department if he is not in hospice care. He will continue follow up with medical oncology in [place] as well.   ________________________________________________    Carola Rhine, PAC

## 2021-06-26 NOTE — Progress Notes (Signed)
Daily Progress Note   Patient Name: Dylan Shaw       Date: 06/26/2021 DOB: [birth year]  Age: 55 y.o. MRN#: [id number] Attending Physician: Aline August, MD Primary Care Physician: Patient, No Pcp Per (Inactive) Admit Date: 05/24/2021  Reason for Consultation/Follow-up: Establishing goals of care  Subjective: Bedside care team expressed concern Dylan Shaw continues to weaken and had coughing that was concerning for aspiration.  I saw and examined Dylan Shaw.  He was awake and alert and lying in bed.  He denies any complaints.  I tried to talk with him about his current clinical situation but he is not really able to follow in-depth conversation nor does he have insight into his current illness.  He expressed again being hopeful to get out of the hospital and wanting to go to rehab to get stronger.  We discussed concern that he is not able to participate at this point and I am worried that he will continue to grow weaker regardless of interventions moving forward.  I attempted to call his brother to discuss further but did not reach him this evening.  I left a voicemail requesting a return call.  Length of Stay: 33  Current Medications: Scheduled Meds:   (feeding supplement) PROSource Plus  30 mL Oral BID BM   dexamethasone  2 mg Oral Q12H   docusate sodium  100 mg Oral BID   feeding supplement  237 mL Oral TID BM   heparin  5,000 Units Subcutaneous Q8H   levETIRAcetam  500 mg Oral BID   pantoprazole  40 mg Oral QHS    Continuous Infusions:  methocarbamol (ROBAXIN) IV      PRN Meds: acetaminophen **OR** [DISCONTINUED] acetaminophen, alum & mag hydroxide-simeth, bisacodyl, LORazepam, LORazepam, methocarbamol **OR** methocarbamol (ROBAXIN) IV, ondansetron **OR** ondansetron (ZOFRAN)  IV, polyethylene glycol, sodium phosphate  Physical Exam         Awake alert Frail No distress Regular work of breathing, coarse throughout S 1 S 2  Abdomen is not distended No edema  Vital Signs: BP 100/75 (BP Location: Right Arm)   Pulse (!) 110   Temp 99.2 F (37.3 C) (Oral)   Resp 18   Ht 5' 7"  (1.702 m)   Wt 46.1 kg   SpO2 98%   BMI 15.92 kg/m  SpO2: SpO2: 98 %  O2 Device: O2 Device: Room Air O2 Flow Rate: O2 Flow Rate (L/min): 2 L/min  Intake/output summary:  Intake/Output Summary (Last 24 hours) at 06/26/2021 0839 Last data filed at 06/25/2021 1920 Gross per 24 hour  Intake 945 ml  Output --  Net 945 ml    LBM: Last BM Date: 06/25/21 Baseline Weight: Weight: 59 kg Most recent weight: Weight: 46.1 kg PPS 50%      Palliative Assessment/Data:      Patient Active Problem List   Diagnosis Date Noted   Palliative care by specialist    Goals of care, counseling/discussion    Protein-calorie malnutrition, severe 05/30/2021   Brain metastasis (Dylan Shaw) 05/26/2021   Metastatic cancer to spine (Dylan Shaw) 05/25/2021   Thoracic spine tumor 05/24/2021   Lung cancer (Dylan Shaw) 05/24/2021   Protein calorie malnutrition (Dylan Shaw) 05/24/2021    Palliative Care Assessment & Plan   Patient Profile:    Assessment:  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   Oncology/radiation oncology, Pulmonology, neurosurgery were all consulted.  Patient is undergoing radiation treatment.  Patient underwent laminectomy, thoracolumbar decompression and fusion with resection of mass on 05/25/2021 by neurosurgery.  MRI also showed metastatic brain lesions.   -Continue Decadron and Keppra.   -Palliative care was consulted.  Family wishes to continue with palliative radiation and chemotherapy as outpatient.  Recommendations/Plan: I met today with Dylan Shaw and we discussed continued concerns about the fact that he is growing weaker and  remains at risk for complications such as aspiration.  He continues to endorse wanting to get stronger.  I do not think that he has insight into his current situation and overall clinical status. Left voicemail for brother requesting return call.    Code Status:    Code Status Orders  (From admission, onward)           Start     Ordered   05/28/21 1620  Do not attempt resuscitation (DNR)  Continuous       Question Answer Comment  In the event of cardiac or respiratory ARREST Do not call a "code blue"   In the event of cardiac or respiratory ARREST Do not perform Intubation, CPR, defibrillation or ACLS   In the event of cardiac or respiratory ARREST Use medication by any route, position, wound care, and other measures to relive pain and suffering. May use oxygen, suction and manual treatment of airway obstruction as needed for comfort.      05/28/21 1619           Code Status History     Date Active Date Inactive Code Status Order ID Comments User Context   05/24/2021 2146 05/28/2021 1619 Full Code [id number]  Elgergawy, Silver Huguenin, MD ED       Prognosis:  Unable to determine Medical oncology to be re consulted as per TRH.   Discharge Planning: To Be Determined SNF rehab is being arranged for.   Care plan was discussed with patient.    Thank you for allowing the Palliative Medicine Team to assist in the care of this patient.   Time In: 1600 Time Out: 1625 Total Time 25 Prolonged Time Billed  no    Greater than 50%  of this time was spent counseling and coordinating care related to the above assessment and plan.   Micheline Rough, MD  Please contact Palliative Medicine Team phone at [phone] for questions and concerns.

## 2021-06-26 NOTE — Progress Notes (Signed)
   06/25/21 2245  Assess: MEWS Score  Temp 98.5 F (36.9 C)  BP 105/77  Pulse Rate (!) 113  Resp 20  SpO2 93 %  O2 Device Room Air  Assess: MEWS Score  MEWS Temp 0  MEWS Systolic 0  MEWS Pulse 2  MEWS RR 0  MEWS LOC 0  MEWS Score 2  MEWS Score Color Yellow  Assess: if the MEWS score is Yellow or Red  Were vital signs taken at a resting state? Yes  Focused Assessment No change from prior assessment  Does the patient meet 2 or more of the SIRS criteria? No  Does the patient have a confirmed or suspected source of infection? No  Provider and Rapid Response Notified? No  MEWS guidelines implemented *See Row Information* Yes  Treat  Pain Score Asleep  Take Vital Signs  Increase Vital Sign Frequency  Yellow: Q 2hr X 2 then Q 4hr X 2, if remains yellow, continue Q 4hrs  Escalate  MEWS: Escalate Yellow: discuss with charge nurse/RN and consider discussing with provider and RRT  Notify: Charge Nurse/RN  Name of Charge Nurse/RN Notified Tom, RN  Date Charge Nurse/RN Notified 06/25/21  Time Charge Nurse/RN Notified 2248  Document  Patient Outcome Other (Comment) (PT. STABLE)  Progress note created (see row info) Yes  Assess: SIRS CRITERIA  SIRS Temperature  0  SIRS Pulse 1  SIRS Respirations  0  SIRS WBC 0  SIRS Score Sum  1

## 2021-06-27 MED ORDER — GLYCOPYRROLATE 1 MG PO TABS: 1.0000 mg | ORAL_TABLET | ORAL | Status: AC | PRN

## 2021-06-27 MED ORDER — LORAZEPAM 1 MG PO TABS
1.0000 mg | ORAL_TABLET | ORAL | Status: AC | PRN
Start: 2021-06-27 — End: ?

## 2021-06-27 MED ORDER — DEXAMETHASONE 2 MG PO TABS: 2.0000 mg | ORAL_TABLET | Freq: Two times a day (BID) | ORAL | Status: AC

## 2021-06-27 MED ORDER — LEVETIRACETAM 500 MG PO TABS
500.0000 mg | ORAL_TABLET | Freq: Two times a day (BID) | ORAL | Status: AC
Start: 2021-06-27 — End: ?

## 2021-06-27 MED ORDER — HALOPERIDOL 0.5 MG PO TABS: 0.5000 mg | ORAL_TABLET | ORAL | Status: AC | PRN

## 2021-06-27 NOTE — TOC Transition Note (Addendum)
Transition of Care [facility]) - CM/SW Discharge Note   Patient Details  Name: Dylan Shaw MRN: [id number] Date of Birth: 1966/04/08  [facility]) CM/SW Contact:  Trish Mage, LCSW Phone Number: 06/27/2021, 2:25 PM   Clinical Narrative:   Patient who has bed offer at [facility] facility is stable for d/c, will be transferred as soon as we get the green light from them.  D/C packet completed and left with unit secretary, who will call for transportation if needed later today. Nursing, please call report to [phone]. TOC will continue to follow during the course of hospitalization.     Final next level of care: [place] Barriers to Discharge: No Barriers Identified   Patient Goals and CMS Choice     Choice offered to / list presented to : Sibling  Discharge Placement                       Discharge Plan and Services   Discharge Planning Services: CM Consult Post Acute Care Choice: [place]                               Social Determinants of Health (SDOH) Interventions     Readmission Risk Interventions No flowsheet data found.

## 2021-06-27 NOTE — TOC Progression Note (Signed)
Transition of Care [facility]) - Progression Note    Patient Details  Name: Dylan Shaw MRN: [id number] Date of Birth: 03/05/1966  Transition [facility]) CM/SW [place], [place] Phone Number: 06/27/2021, 11:30 AM  Clinical Narrative:   After speaking with Dr Lorin Picket with palliative, called Cassandra with [facility] to make referral for hospice facility. She will respond after they have reviewed. TOC will continue to follow during the course of hospitalization.     Expected Discharge Plan: Hospice Medical Facility Barriers to Discharge: Other (must enter comment) (hospice facility pending medical review, approval)  Expected Discharge Plan and Services Expected Discharge Plan: [place]   Discharge Planning Services: CM Consult Post Acute Care Choice: [place] Living arrangements for the past 2 months: Single Family Home                                       Social Determinants of Health (SDOH) Interventions    Readmission Risk Interventions No flowsheet data found.

## 2021-06-27 NOTE — Progress Notes (Addendum)
Dylan Shaw, male   DOB: April 20, 1966, 55 y.o.   MRN: [id number]  PROGRESS NOTE    Dylan Shaw  IPJ:[id number] DOB: [birth year] DOA: 05/24/2021 PCP: Dylan, No Pcp Per (Inactive)   Brief Narrative:  55 years old male with history of cigarette smoking presented to hospital with significant weight loss fatigue low back pain.  He was found to have a lung cancer with metastasis to the spine.  He also developed urinary and bowel incontinence.  Neurosurgery was consulted and surgical decompression was performed.  Subsequently, oncology and radiation oncology were consulted.  Surgical pathology showed poorly differentiated lung adenocarcinoma.  Dylan had prolonged hospital course complicated by encephalopathy and  intermittent agitation.  Oncology recommended hospice versus palliative whole brain radiation.  Family was interested in proceeding with whole brain radiation and palliative chemotherapy.  At this time, Dylan is waiting for skilled nursing facility. 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   Oncology/radiation oncology, Pulmonology, neurosurgery were all consulted.  Dylan is undergoing radiation treatment.  Dylan underwent laminectomy, thoracolumbar decompression and fusion with resection of mass on 05/25/2021 by neurosurgery.  MRI also showed metastatic brain lesions.   -Continue Decadron and Keppra. Taper Decadron 2 mg twice daily 6/23, then 2 mg daily up until 7/7 then 2 mg every other day and eventually stop on 7/13 -Oncology following: for now awaiting placement and will start potential further treatment once outpatient assuming he improves but at this point his prognosis is quite poor -Palliative care following: Dylan's overall condition is getting worse.  If Dylan continues to decline, Dylan might qualify for residential hospice.   Metabolic encephalopathy secondary to brain mets,  mass-effect, steroid use, seizure with postictal phase. -Improved.  Try to avoid Haldol due to concern for seizures.     Leukocytosis Mild.  No concern for infection.  On steroids.   Aphasia/focal seizure     -EEG was negative for seizure.  Neurology was consulted.  On Keppra at this time.  Has improved at this time,  no further seizures.  Code stroke was called on 05/26/2021.  Negative CT for stroke.   Severe protein calorie moderation Severe temporal wasting.  BMI 15.9.  Continue dietary supplements.     Tobacco use    Not on any patch at this time.   Normocytic anemia Likely secondary to malignancy.   Debility/deconditioning     - PT/OT recommending skilled nursing facility on discharge.    Stage II pressure ulcer on medial coccyx: not present on admission -Continue local wound care    DVT prophylaxis: heparin subcu   Code Status: DNR   Family Communication:  None   Status is: Inpatient   Remains inpatient appropriate because:Unsafe d/c plan, awaiting for skilled nursing facility placement.   Dispo: The Dylan is from: Home              Anticipated d/c is to: SNF versus residential hospice if condition worsens              Dylan currently is medically stable to d/c.              Difficult to place Dylan Yes     Consultants:  Neurosurgery [facility] [place] Neurology Palliative care Critical care   Procedures:  XRT Laminectomy, decompressive spine surgery.   Antimicrobials: None currently  Subjective: Dylan seen and examined at bedside.  Poor historian.  No vomiting,  seizures, fever reported overnight by nursing staff Objective: Vitals:   06/26/21 1238 06/26/21 1322 06/26/21 2032 06/27/21 0323  BP: 118/84 117/80 102/70   Pulse: (!) 113 (!) 113 (!) 117   Resp: 20 (!) 30 (!) 24 20  Temp: 98.1 F (36.7 C) 98.8 F (37.1 C) 99 F (37.2 C)   TempSrc: Oral Oral Oral   SpO2: 95% 94% 93%   Weight:      Height:        Intake/Output Summary (Last  24 hours) at 06/27/2021 0741 Last data filed at 06/27/2021 0016 Gross per 24 hour  Intake 1063 ml  Output 500 ml  Net 563 ml    Filed Weights   05/24/21 2208 05/25/21 0601 05/25/21 1737  Weight: 46.1 kg 46.1 kg 46.1 kg    Examination:  General exam: No distress.  On room air currently.  Looks older than stated age; very deconditioned and cachectic.  Poor historian. Respiratory system: Decreased bilateral breath sounds at bases with scattered crackles; intermittently tachypneic Cardiovascular system: Intermittently tachycardic; S1-S2 heard gastrointestinal system: Abdomen is slightly distended, soft and nontender.  Bowel sounds heard Extremities: No cyanosis; mild lower extremity edema present  Data Reviewed: I have personally reviewed following labs and imaging studies  CBC: Recent Labs  Lab 06/21/21 0438  WBC 11.5*  HGB 11.5*  HCT 37.2*  MCV 91.4  PLT 765    Basic Metabolic Panel: Recent Labs  Lab 06/21/21 0438  NA 138  K 4.3  CL 99  CO2 29  GLUCOSE 101*  BUN 22*  CREATININE 0.67  CALCIUM 9.6    GFR: Estimated Creatinine Clearance: 69.6 mL/min (by C-G formula based on SCr of 0.67 mg/dL). Liver Function Tests: Recent Labs  Lab 06/21/21 0438  AST 16  ALT 24  ALKPHOS 103  BILITOT 0.5  PROT 7.4  ALBUMIN 2.5*    No results for input(s): LIPASE, AMYLASE in the last 168 hours. No results for input(s): AMMONIA in the last 168 hours. Coagulation Profile: No results for input(s): INR, PROTIME in the last 168 hours. Cardiac Enzymes: No results for input(s): CKTOTAL, CKMB, CKMBINDEX, TROPONINI in the last 168 hours. BNP (last 3 results) No results for input(s): PROBNP in the last 8760 hours. HbA1C: No results for input(s): HGBA1C in the last 72 hours. CBG: Recent Labs  Lab 06/25/21 0626 06/25/21 1131 06/25/21 1637 06/26/21 0627 06/26/21 1131  GLUCAP 112* 161* 115* 75 180*    Lipid Profile: No results for input(s): CHOL, HDL, LDLCALC, TRIG,  CHOLHDL, LDLDIRECT in the last 72 hours. Thyroid Function Tests: No results for input(s): TSH, T4TOTAL, FREET4, T3FREE, THYROIDAB in the last 72 hours. Anemia Panel: No results for input(s): VITAMINB12, FOLATE, FERRITIN, TIBC, IRON, RETICCTPCT in the last 72 hours. Sepsis Labs: No results for input(s): PROCALCITON, LATICACIDVEN in the last 168 hours.  No results found for this or any previous visit (from the past 240 hour(s)).       Radiology Studies: DG CHEST PORT 1 VIEW  Result Date: 06/25/2021 CLINICAL DATA:  Shortness of breath, history of lung carcinoma EXAM: PORTABLE CHEST 1 VIEW COMPARISON:  05/24/2021 FINDINGS: Cardiac shadow is stable. Postsurgical changes are seen in the lower thoracic spine consistent with recent surgical history. Left lung is clear. Right lung shows diffuse increased density consistent with the Dylan's known lung masses. Rib fractures are noted with healing. No acute bony abnormality is seen. No pneumothorax is noted IMPRESSION: Postsurgical changes in the thoracolumbar spine. Fullness in the right hilum  and increased density in the right lung base consistent with the known history of lung carcinoma similar to that seen on prior CT. Electronically Signed   By: Inez Catalina M.D.   On: 06/25/2021 16:21        Scheduled Meds:  dexamethasone  2 mg Oral Q12H   feeding supplement  237 mL Oral TID BM   heparin  5,000 Units Subcutaneous Q8H   levETIRAcetam  500 mg Oral BID    morphine injection  2 mg Intravenous Q4H   Continuous Infusions:  methocarbamol (ROBAXIN) IV            Aline August, MD [facility] 06/27/2021, 7:41 AM

## 2021-06-27 NOTE — Discharge Summary (Addendum)
Physician Discharge Summary  Ari Bernabei BPZ:[id number] DOB: [birth year] DOA: 05/24/2021  PCP: Patient, No Pcp Per (Inactive)  Admit date: 05/24/2021 Discharge date: 06/28/2021  Admitted From: Home Disposition: Residential hospice   Recommendations for Outpatient Follow-up:  Follow up with residential hospice at earliest [place]: No Equipment/Devices: None  Discharge Condition: Poor CODE STATUS: DNR  diet recommendation: As per comfort measures  Brief/Interim Summary: 55 years old male with history of cigarette smoking presented to hospital with significant weight loss fatigue low back pain.  He was found to have a lung cancer with metastasis to the spine.  He also developed urinary and bowel incontinence.  Neurosurgery was consulted and surgical decompression was performed.  Subsequently, oncology and radiation oncology were consulted.  Surgical pathology showed poorly differentiated lung adenocarcinoma.  Patient had prolonged hospital course complicated by encephalopathy and  intermittent agitation.  Oncology recommended hospice versus palliative whole brain radiation.  Family was initially interested in proceeding with whole brain radiation and palliative chemotherapy.  During the hospitalization, his condition did not improve and kept getting worse.  Family subsequently agreed for residential hospice placement.  He will be discharged to residential hospice once bed is available.  Addendum: Patient is waiting to be discharged to residential hospice. Overall prognosis is very poor.  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   Oncology/radiation oncology, Pulmonology, neurosurgery were all consulted.  Patient is undergoing radiation treatment.  Patient underwent laminectomy, thoracolumbar decompression and fusion with  resection of mass on 05/25/2021 by neurosurgery.  MRI also showed metastatic brain lesions.   -Continue Decadron and Keppra till patient is able to swallow -Oncology recommended hospice versus palliative whole brain radiation.  Family was initially interested in proceeding with whole brain radiation and palliative chemotherapy.  During the hospitalization, his condition did not improve and kept getting worse.  Family subsequently agreed for residential hospice placement.  He will be discharged to residential hospice once bed is available.   Metabolic encephalopathy secondary to brain mets, mass-effect, steroid use, seizure with postictal phase. Leukocytosis Aphasia/focal seizure     -EEG was negative for seizure.  Neurology was consulted.  On Keppra at this time.  Has improved at this time,  no further seizures.  Code stroke was called on 05/26/2021.  Negative CT for stroke.   Severe protein calorie moderation Tobacco use Normocytic anemia: Likely secondary to malignancy. Debility/deconditioning Stage II pressure ulcer on medial coccyx: not present on admission -Continue local wound care   Discharge Instructions  Discharge Instructions     Diet general   Complete by: As directed    Increase activity slowly   Complete by: As directed    No wound care   Complete by: As directed       Allergies as of 06/27/2021   No Known Allergies      Medication List     TAKE these medications    dexamethasone 2 MG tablet Commonly known as: DECADRON Take 1 tablet (2 mg total) by mouth every 12 (twelve) hours.   glycopyrrolate 1 MG tablet Commonly known as: ROBINUL Take 1 tablet (1 mg total) by mouth every 4 (four) hours as needed (excessive secretions).   haloperidol 0.5 MG tablet Commonly known as: HALDOL Take 1 tablet (0.5 mg total) by mouth every 4 (four) hours as needed for agitation (or delirium).  levETIRAcetam 500 MG tablet Commonly known as: KEPPRA Take 1 tablet (500 mg  total) by mouth 2 (two) times daily.   LORazepam 1 MG tablet Commonly known as: ATIVAN Take 1 tablet (1 mg total) by mouth every 4 (four) hours as needed for anxiety.         No Known Allergies  Consultations: Neurosurgery Med Onc Rad Onc Neurology Palliative care Critical care   Procedures/Studies: DG CHEST PORT 1 VIEW  Result Date: 06/25/2021 CLINICAL DATA:  Shortness of breath, history of lung carcinoma EXAM: PORTABLE CHEST 1 VIEW COMPARISON:  05/24/2021 FINDINGS: Cardiac shadow is stable. Postsurgical changes are seen in the lower thoracic spine consistent with recent surgical history. Left lung is clear. Right lung shows diffuse increased density consistent with the patient's known lung masses. Rib fractures are noted with healing. No acute bony abnormality is seen. No pneumothorax is noted IMPRESSION: Postsurgical changes in the thoracolumbar spine. Fullness in the right hilum and increased density in the right lung base consistent with the known history of lung carcinoma similar to that seen on prior CT. Electronically Signed   By: Inez Catalina M.D.   On: 06/25/2021 16:21      Subjective: Patient seen and examined at bedside.  Poor historian.  No vomiting, seizures, fever reported overnight by nursing staff  Discharge Exam: Vitals:   06/26/21 2032 06/27/21 0323  BP: 102/70   Pulse: (!) 117   Resp: (!) 24 20  Temp: 99 F (37.2 C)   SpO2: 93%     General exam: No distress.  On room air currently.  Looks older than stated age; very deconditioned and cachectic.  Poor historian. Respiratory system: Decreased bilateral breath sounds at bases with scattered crackles; intermittently tachypneic Cardiovascular system: Intermittently tachycardic; S1-S2 heard gastrointestinal system: Abdomen is slightly distended, soft and nontender.  Bowel sounds heard Extremities: No cyanosis; mild lower extremity edema present    The results of significant diagnostics from this  hospitalization (including imaging, microbiology, ancillary and laboratory) are listed below for reference.     Microbiology: No results found for this or any previous visit (from the past 240 hour(s)).   Labs: BNP (last 3 results) Recent Labs    06/26/21 0518  BNP 78.6   Basic Metabolic Panel: Recent Labs  Lab 06/21/21 0438  NA 138  K 4.3  CL 99  CO2 29  GLUCOSE 101*  BUN 22*  CREATININE 0.67  CALCIUM 9.6   Liver Function Tests: Recent Labs  Lab 06/21/21 0438  AST 16  ALT 24  ALKPHOS 103  BILITOT 0.5  PROT 7.4  ALBUMIN 2.5*   No results for input(s): LIPASE, AMYLASE in the last 168 hours. No results for input(s): AMMONIA in the last 168 hours. CBC: Recent Labs  Lab 06/21/21 0438  WBC 11.5*  HGB 11.5*  HCT 37.2*  MCV 91.4  PLT 370   Cardiac Enzymes: No results for input(s): CKTOTAL, CKMB, CKMBINDEX, TROPONINI in the last 168 hours. BNP: Invalid input(s): POCBNP CBG: Recent Labs  Lab 06/25/21 0626 06/25/21 1131 06/25/21 1637 06/26/21 0627 06/26/21 1131  GLUCAP 112* 161* 115* 75 180*   D-Dimer No results for input(s): DDIMER in the last 72 hours. Hgb A1c No results for input(s): HGBA1C in the last 72 hours. Lipid Profile No results for input(s): CHOL, HDL, LDLCALC, TRIG, CHOLHDL, LDLDIRECT in the last 72 hours. Thyroid function studies No results for input(s): TSH, T4TOTAL, T3FREE, THYROIDAB in the last 72 hours.  Invalid input(s): FREET3 Anemia  work up No results for input(s): VITAMINB12, FOLATE, FERRITIN, TIBC, IRON, RETICCTPCT in the last 72 hours. Urinalysis No results found for: COLORURINE, APPEARANCEUR, LABSPEC, [place], GLUCOSEU, HGBUR, BILIRUBINUR, KETONESUR, PROTEINUR, UROBILINOGEN, NITRITE, LEUKOCYTESUR Sepsis Labs Invalid input(s): PROCALCITONIN,  WBC,  LACTICIDVEN Microbiology No results found for this or any previous visit (from the past 240 hour(s)).   Time coordinating discharge: 35 minutes  SIGNED:   Aline August,  MD  [facility] 06/27/2021, 3:05 PM

## 2021-06-28 DIAGNOSIS — L8990 Pressure ulcer of unspecified site, unspecified stage: Secondary | ICD-10-CM | POA: Insufficient documentation

## 2021-06-28 LAB — RESP PANEL BY RT-PCR (FLU A&B, COVID) ARPGX2
Influenza A by PCR: NEGATIVE
Influenza B by PCR: NEGATIVE
SARS Coronavirus 2 by RT PCR: NEGATIVE

## 2021-06-28 NOTE — Progress Notes (Signed)
Patient discharging to Hospice at [facility] via [facility]. Will call report. Belongings sent with patient.

## 2021-06-28 NOTE — Progress Notes (Signed)
Patient ID: Dylan Shaw, male   DOB: [birth year], 55 y.o.   MRN: [id number] Patient is waiting to be discharged to residential hospice.  Patient seen and examined at bedside.  Please refer to the full discharge summary done by me on 06/27/2021 for full details.  Overall prognosis is very poor.

## 2021-06-28 NOTE — Progress Notes (Signed)
Daily Progress Note   Patient Name: Dylan Shaw       Date: 06/28/2021 DOB: [birth year]  Age: 55 y.o. MRN#: [id number] Attending Physician: Aline August, MD Primary Care Physician: Patient, No Pcp Per (Inactive) Admit Date: 05/24/2021  Reason for Consultation/Follow-up: Establishing goals of care  Subjective: I met with Dylan Shaw at bedside this morning. Upon entering the room he was completely naked and refused to wear his gown. I was able to convince him to use a blanket to cover up his private areas while we spoke.   He continues to lack insight and knowledge of his condition and prognosis. He says he was told he is doing better but also says he does not really know how he feels. He reports his breathing is easier than yesterday. He denies acute complaints.   He understands that he is leaving the hospital to go to a residential hospice facility. He is agreeable to this plan and has no questions or concerns regarding plan of care of full comfort.  No family at bedside.       Length of Stay: 35  Current Medications: Scheduled Meds:   dexamethasone  2 mg Oral Q12H   feeding supplement  237 mL Oral TID BM   heparin  5,000 Units Subcutaneous Q8H   levETIRAcetam  500 mg Oral BID    morphine injection  2 mg Intravenous Q4H    Continuous Infusions:  methocarbamol (ROBAXIN) IV      PRN Meds: acetaminophen **OR** acetaminophen, alum & mag hydroxide-simeth, antiseptic oral rinse, bisacodyl, glycopyrrolate **OR** glycopyrrolate **OR** glycopyrrolate, haloperidol **OR** haloperidol **OR** haloperidol lactate, LORazepam **OR** LORazepam **OR** LORazepam, [DISCONTINUED] methocarbamol **OR** methocarbamol (ROBAXIN) IV, morphine injection, ondansetron **OR** ondansetron (ZOFRAN) IV, [DISCONTINUED]  ondansetron **OR** ondansetron (ZOFRAN) IV, polyethylene glycol, polyvinyl alcohol, sodium phosphate  Physical Exam Constitutional:      General: He is awake.     Comments: Generalize muscle wasting, frail  HENT:     Head:     Comments: Temporal wasting Cardiovascular:     Rate and Rhythm: Normal rate.  Pulmonary:     Effort: Pulmonary effort is normal.  Abdominal:     General: There is no distension.     Palpations: Abdomen is soft.     Tenderness: There is no abdominal tenderness.  Neurological:     Mental Status: Mental status is at baseline.  Psychiatric:        Speech: Speech normal.            Vital Signs: BP 103/60 (BP Location: Right Arm)   Pulse 72   Temp 98.9 F (37.2 C) (Oral)   Resp 20   Ht _0  (1.702 m)   Wt 46.1 kg   SpO2 93%   BMI 15.92 kg/m  SpO2: SpO2: 93 % O2 Device: O2 Device: Nasal Cannula O2 Flow Rate: O2 Flow Rate (L/min): 2 L/min  Intake/output summary:  Intake/Output Summary (Last 24 hours) at 06/28/2021 1031 Last data filed at 06/28/2021 1003 Gross per 24 hour  Intake 483 ml  Output 1075 ml  Net -592 ml   LBM: Last BM Date: 06/27/21 Baseline Weight: Weight: 59 kg Most recent weight: Weight: 46.1 kg  Palliative Assessment/Data:      Patient Active Problem List   Diagnosis Date Noted   Pressure injury of skin 06/28/2021   Palliative care by specialist    Goals of care, counseling/discussion    Protein-calorie malnutrition, severe 05/30/2021   Brain metastasis ([place]) 05/26/2021   Metastatic cancer to spine ([place]) 05/25/2021   Thoracic spine tumor 05/24/2021   Lung cancer ([place]) 05/24/2021   Protein calorie malnutrition ([place]) 05/24/2021    Palliative Care Assessment & Plan   Assessment: Dylan Shaw is a 55 y.o. male with PMH significant for metastatic adenocarcinoma of the lung with T12 mass with cord compression and mets to brain as well as cigarette smoking, weight loss, fatigue, back pain, urinary and bowel incontinence,  encephalopathy, and intermittent agitation.   On 05/25/21, neurosurgery performed laminectomy and thorcolumbar decompression and fusion with resection of mass.   However, MRI showed mestastic brain lesions for which family and pt initially wanted to treat aggressively with radiation. Given Dylan Shaw's declining condition with no sign of improvement over his 34 day hospitalization and poor prognosis, pt and family are agreeable to residential hospice placement.   Recommendations/Plan: As per TOC, pt has been offered a bed at [facility] inpatient facility.  Goals of Care and Additional Recommendations: Limitations on Scope of Treatment: Full Comfort Care  Code Status: DNR  Prognosis:  < 2 weeks  Discharge Planning: Hospice facility  Thank you for allowing the Palliative Medicine Team to assist in the care of this patient.       Greater than 50%  of this time was spent counseling and coordinating care related to the above assessment and plan.  [place] Ilsa Iha, FNP-BC Palliative Medicine Team Team Phone # [phone]  Pager # [phone]

## 2021-06-28 NOTE — Progress Notes (Signed)
Daily Progress Note   Patient Name: Dylan Shaw       Date: 06/28/2021 DOB: 1966-12-10  Age: 55 y.o. MRN#: [id number] Attending Physician: Aline August, MD Primary Care Physician: Patient, No Pcp Per (Inactive) Admit Date: 05/24/2021  Reason for Consultation/Follow-up: Establishing goals of care  Subjective: I saw and examined Mr. Dylan Shaw today.  He was a little more awake today and was able to hold a brief conversation.  He still does not have any real insight into his condition.  He continues to be up and down on a daily basis with how he is feeling, but overall he has continued decline in his clinical course.  He denies any specific needs or complaints today.  Length of Stay: 35  Current Medications: Scheduled Meds:   dexamethasone  2 mg Oral Q12H   feeding supplement  237 mL Oral TID BM   heparin  5,000 Units Subcutaneous Q8H   levETIRAcetam  500 mg Oral BID    morphine injection  2 mg Intravenous Q4H    Continuous Infusions:  methocarbamol (ROBAXIN) IV      PRN Meds: acetaminophen **OR** acetaminophen, alum & mag hydroxide-simeth, antiseptic oral rinse, bisacodyl, glycopyrrolate **OR** glycopyrrolate **OR** glycopyrrolate, haloperidol **OR** haloperidol **OR** haloperidol lactate, LORazepam **OR** LORazepam **OR** LORazepam, [DISCONTINUED] methocarbamol **OR** methocarbamol (ROBAXIN) IV, morphine injection, ondansetron **OR** ondansetron (ZOFRAN) IV, [DISCONTINUED] ondansetron **OR** ondansetron (ZOFRAN) IV, polyethylene glycol, polyvinyl alcohol, sodium phosphate  Physical Exam         A little more awake, but still remains limited in his interaction with me today Frail No distress Regular work of breathing, coarse throughout S 1 S 2  Abdomen is not distended No  edema  Vital Signs: BP 103/60 (BP Location: Right Arm)   Pulse 72   Temp 98.9 F (37.2 C) (Oral)   Resp 20   Ht 5\' 7"  (1.702 m)   Wt 46.1 kg   SpO2 93%   BMI 15.92 kg/m  SpO2: SpO2: 93 % O2 Device: O2 Device: Nasal Cannula O2 Flow Rate: O2 Flow Rate (L/min): 2 L/min  Intake/output summary:  Intake/Output Summary (Last 24 hours) at 06/28/2021 0726 Last data filed at 06/27/2021 2000 Gross per 24 hour  Intake 719 ml  Output 675 ml  Net 44 ml    LBM: Last BM Date: 06/27/21 Baseline  Weight: Weight: 59 kg Most recent weight: Weight: 46.1 kg PPS 50%      Palliative Assessment/Data:      Patient Active Problem List   Diagnosis Date Noted   Pressure injury of skin 06/28/2021   Palliative care by specialist    Goals of care, counseling/discussion    Protein-calorie malnutrition, severe 05/30/2021   Brain metastasis ([place]) 05/26/2021   Metastatic cancer to spine ([place]) 05/25/2021   Thoracic spine tumor 05/24/2021   Lung cancer ([place]) 05/24/2021   Protein calorie malnutrition ([place]) 05/24/2021    Palliative Care Assessment & Plan   Patient Profile:    Assessment:  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   Oncology/radiation oncology, Pulmonology, neurosurgery were all consulted.  Patient is undergoing radiation treatment.  Patient underwent laminectomy, thoracolumbar decompression and fusion with resection of mass on 05/25/2021 by neurosurgery.  MRI also showed metastatic brain lesions.    Recommendations/Plan: DNR/DNI Focus on comfort moving forward.  Pain/SOB: He has some increased work of breathing.  We will plan for addition of low-dose scheduled morphine as well as additional as needed morphine if needed for any pain or shortness of breath. Anxiety: Ativan as needed Agitation: Haldol as needed Excess secretions: Robinul as needed    Code Status:    Code Status Orders  (From admission, onward)            Start     Ordered   05/28/21 1620  Do not attempt resuscitation (DNR)  Continuous       Question Answer Comment  In the event of cardiac or respiratory ARREST Do not call a "code blue"   In the event of cardiac or respiratory ARREST Do not perform Intubation, CPR, defibrillation or ACLS   In the event of cardiac or respiratory ARREST Use medication by any route, position, wound care, and other measures to relive pain and suffering. May use oxygen, suction and manual treatment of airway obstruction as needed for comfort.      05/28/21 1619           Code Status History     Date Active Date Inactive Code Status Order ID Comments User Context   05/24/2021 2146 05/28/2021 1619 Full Code [id number]  Elgergawy, Silver Huguenin, MD ED       Prognosis: Yesterday, prior to transition to comfort, his blood pressure was very low but responded temporarily to a fluid bolus.  It is migrating down again today.  With plan for comfort rather than continued aggressive interventions, I do believe that his prognosis is likely to be limited to a couple of weeks and he would still best be served by residential hospice if it can be arranged.  Discharge Planning: ?  Residential hospice  Thank you for allowing the Palliative Medicine Team to assist in the care of this patient.   Total Time 20 Prolonged Time Billed  no    Greater than 50%  of this time was spent counseling and coordinating care related to the above assessment and plan.  Micheline Rough, MD  Please contact Palliative Medicine Team phone at [phone] for questions and concerns.

## 2021-07-29 DEATH — deceased

## 2021-09-13 IMAGING — CT CT ABD-PELV W/ CM
2 of 5 series · 12 of 36 positions shown, 15 images · IV contrast (Omnipaque or Isovue)
Comparison: None.

CLINICAL DATA: Decreased appetite , weight loss

EXAM:
CT CHEST, ABDOMEN, AND PELVIS WITH CONTRAST
TECHNIQUE: Multidetector CT imaging of the chest, abdomen and pelvis was
performed following the standard protocol during bolus
administration of intravenous contrast.
CONTRAST:  75mL OMNIPAQUE IOHEXOL 300 MG/ML  SOLN

[Series 2: cap with · axial · 0.88mm/px · z∈[-674,-149]mm · 9 of 133 slices shown, 12 images]
[im 14/133  mediastinal]
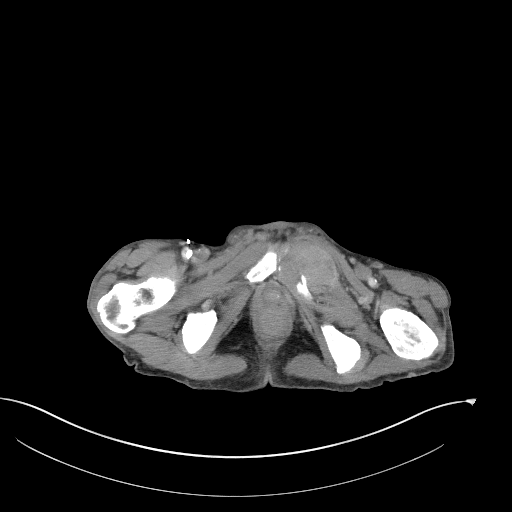
[im 14/133  lung]
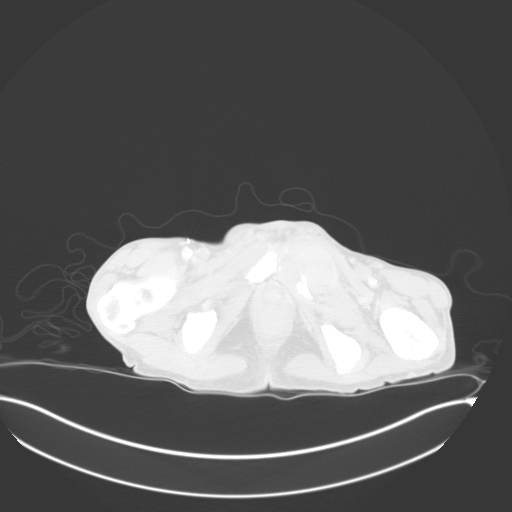
[im 27/133  lung]
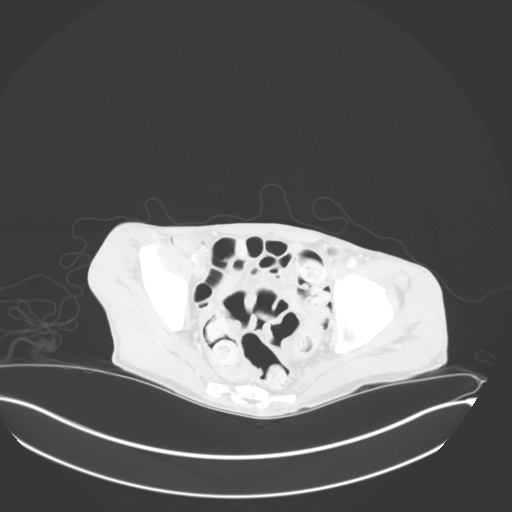
[im 40/133  lung]
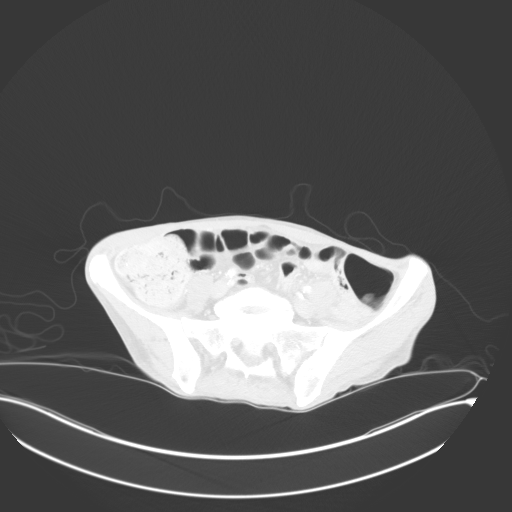
[im 53/133  lung]
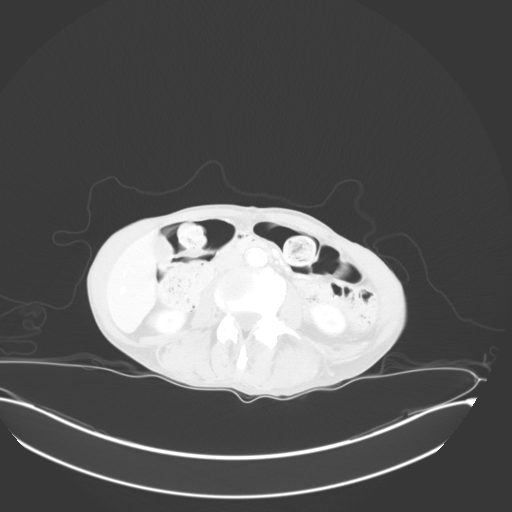
[im 67/133  mediastinal]
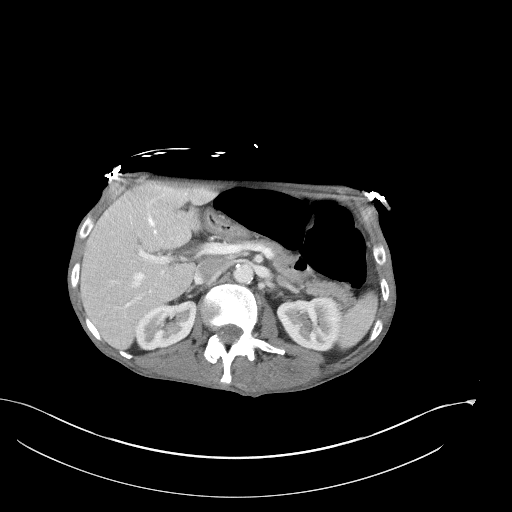
[im 67/133  lung]
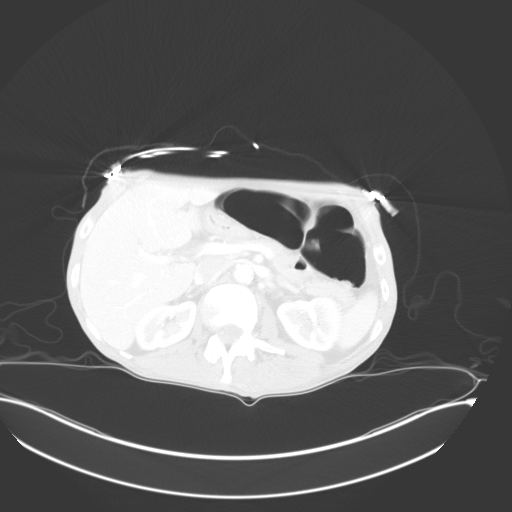
[im 80/133  lung]
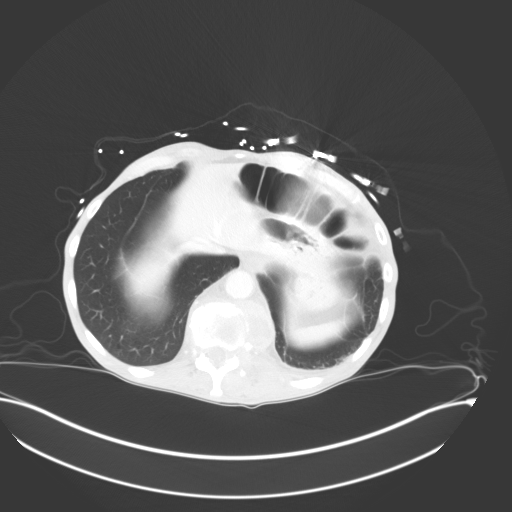
[im 93/133  lung]
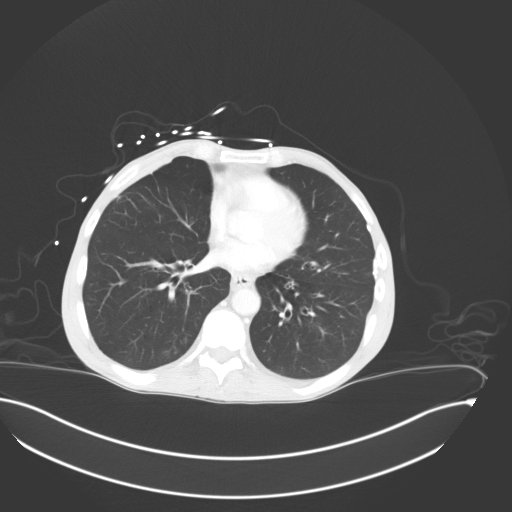
[im 106/133  lung]
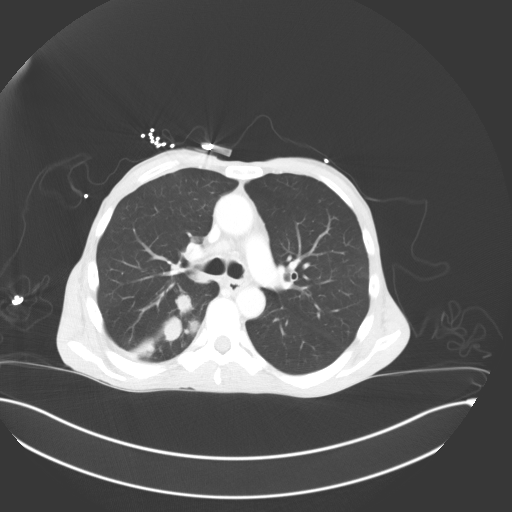
[im 119/133  mediastinal]
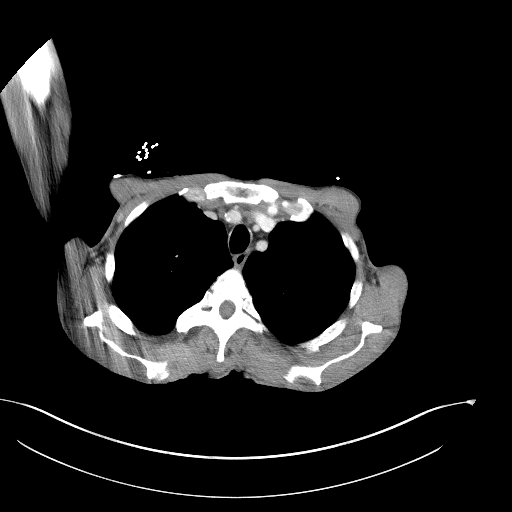
[im 119/133  lung]
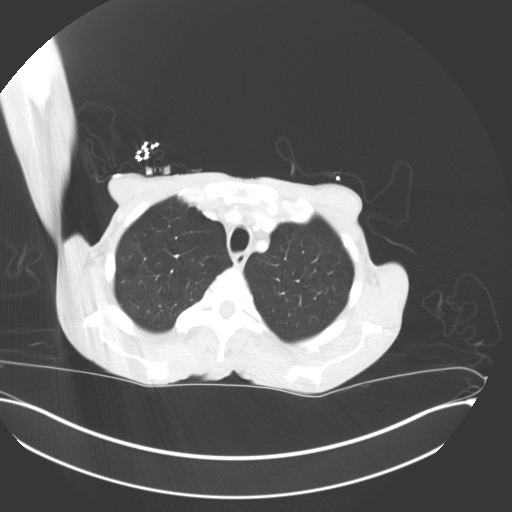

[Series 3: coronals · coronal · 0.88mm/px · 3 of 134 slices shown]
[im 27/134  lung]
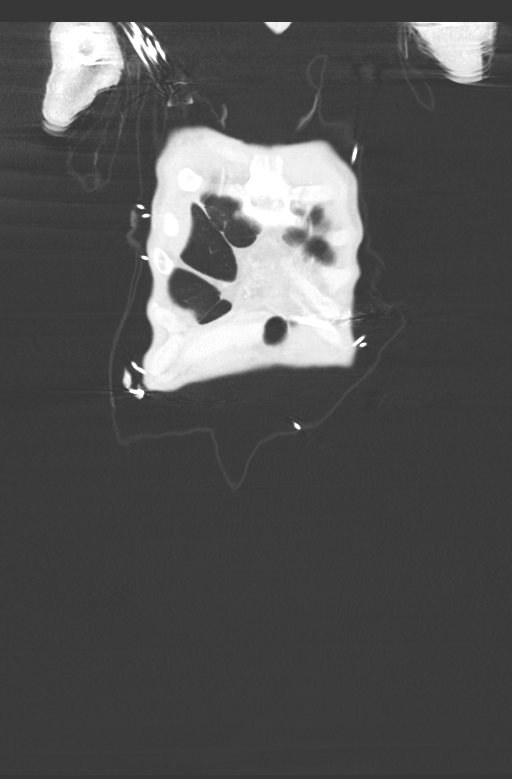
[im 54/134  lung]
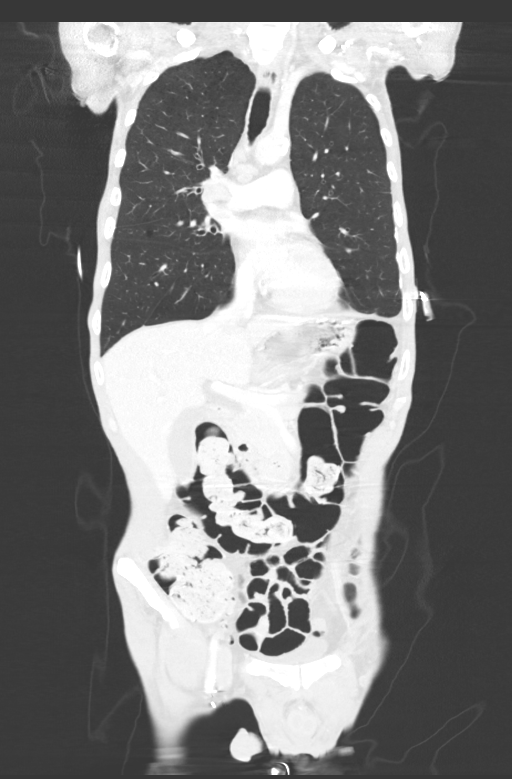
[im 80/134  lung]
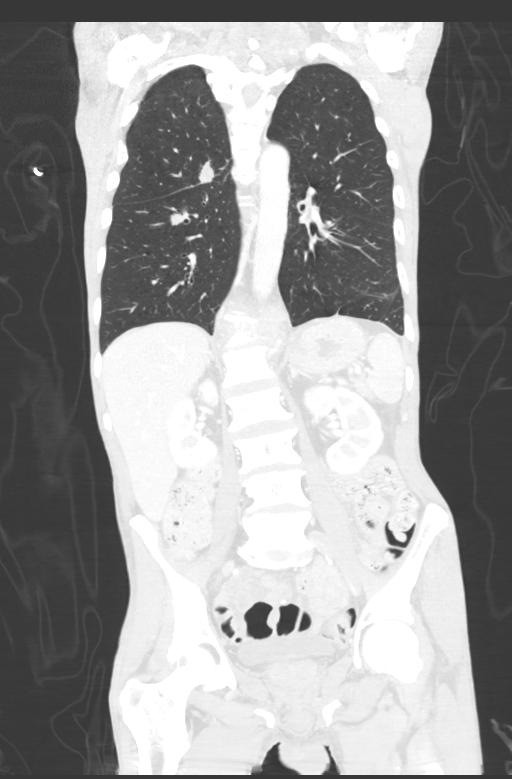

[12 of 36 positions shown; findings below may reference images not displayed]

FINDINGS: CT CHEST FINDINGS

Cardiovascular: Heart size normal. No pericardial effusion.
Calcified plaque in the aortic arch.

Mediastinum/Nodes: Enlarged right hilar lymph nodes up to 1.9 cm
short axis diameter. 1.4 cm subcarinal adenopathy. 1 cm enlarged
precarinal node. Prominent subcentimeter right paratracheal and AP
window lymph nodes.

Lungs/Pleura: No pleural effusion. Partially calcified left pleural
plaque. No pneumothorax. 5.4 cm lobular pleural-based mass, superior
segment right lower lobe. Anteriorly and superiorly is a cluster of
approximately 6 nodules measuring up to 2.3 cm. Bandlike scarring or
subsegmental atelectasis in the anterior right upper lobe. 3 mm
subpleural nodule medially in the left upper lobe (Im27,Se5) . left
lung otherwise clear.

Musculoskeletal: 9.5 cm complex lytic lesion involving the T12
vertebral body, left pedicle and posterior elements with
extraosseous extension and epidural extension of tumor resulting in
at least mild narrowing of the central canal. There is moderate
pathologic compression deformity of the T12 vertebral body. Old
fracture deformities of multiple right ribs. Lytic lesion involving
the lateral aspect right sixth rib. Bilateral shoulder DJD.

CT ABDOMEN PELVIS FINDINGS

Hepatobiliary: No focal liver abnormality is seen. No gallstones,
gallbladder wall thickening, or biliary dilatation.

Pancreas: Unremarkable. No pancreatic ductal dilatation or
surrounding inflammatory changes.

Spleen: Normal in size. 1 cm poorly marginated low-attenuation
lesion inferolaterally, indeterminate.

Adrenals/Urinary Tract: Left adrenal nodule measuring at least
cm. Right adrenal unremarkable. Normal renal enhancement without
focal lesion, hydronephrosis, or urolithiasis. The urinary bladder
is incompletely distended.

Stomach/Bowel: The stomach is nondistended. Small bowel
decompressed. Normal appendix. The colon is nondilated,
unremarkable.

Vascular/Lymphatic: Moderate aortic and iliofemoral calcified
arterial plaque without aneurysm or evident high-grade stenosis. No
abdominal or pelvic adenopathy.

Reproductive: Mild prostate enlargement with central coarse
calcification.

Other: No ascites.  No free air.

Musculoskeletal: 1 cm lytic lesion involving the medial aspect right
ilium. 5.1 cm lytic lesion involving left pubic bone with
extraosseous extension of tumor anteriorly. 0.7 cm lytic lesion
involving the anterior margin of the ilium. No pathologic fracture.
IMPRESSION: 1. 5.4 cm right lower lobe lung mass with satellite nodules, right
hilar and mediastinal adenopathy, osseous and possibly left adrenal
metastatic disease as above, suggesting stage IV lung carcinoma.
2. Pathologic T12 compression fracture with epidural extension of
tumor and at least mild spinal stenosis.
3.  Aortic Atherosclerosis (4KE8N-170.0).

## 2021-09-13 IMAGING — CT CT CHEST W/ CM
2 of 5 series · 12 of 36 positions shown, 15 images · IV contrast (omnipaque)
Comparison: None.

CLINICAL DATA: Decreased appetite , weight loss

EXAM:
CT CHEST, ABDOMEN, AND PELVIS WITH CONTRAST
TECHNIQUE: Multidetector CT imaging of the chest, abdomen and pelvis was
performed following the standard protocol during bolus
administration of intravenous contrast.
CONTRAST:  75mL OMNIPAQUE IOHEXOL 300 MG/ML  SOLN

[Series 2: cap with · axial · 0.88mm/px · z∈[-674,-149]mm · 9 of 133 slices shown, 12 images]
[im 14/133  mediastinal]
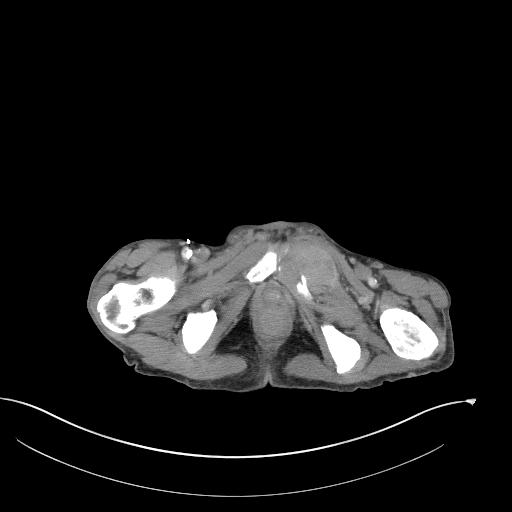
[im 14/133  lung]
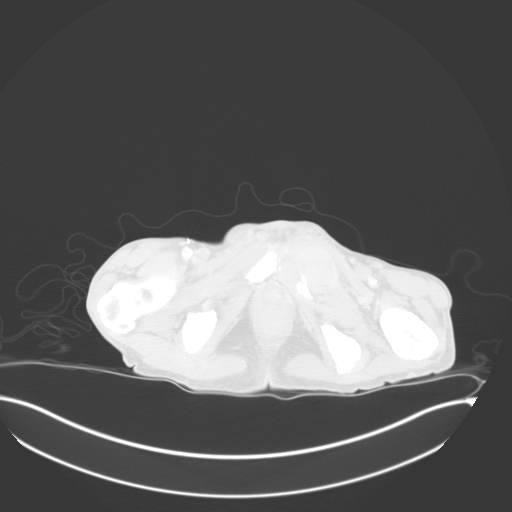
[im 27/133  lung]
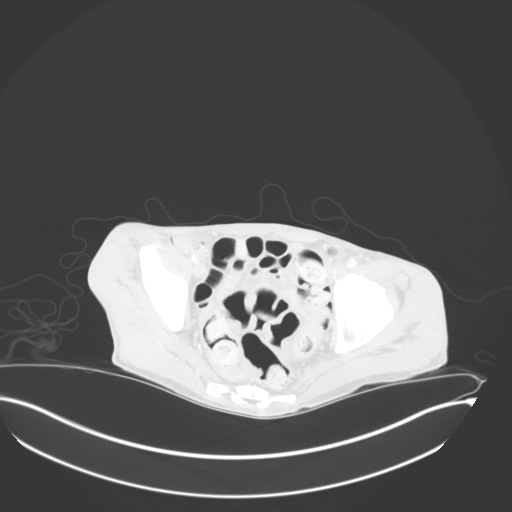
[im 40/133  lung]
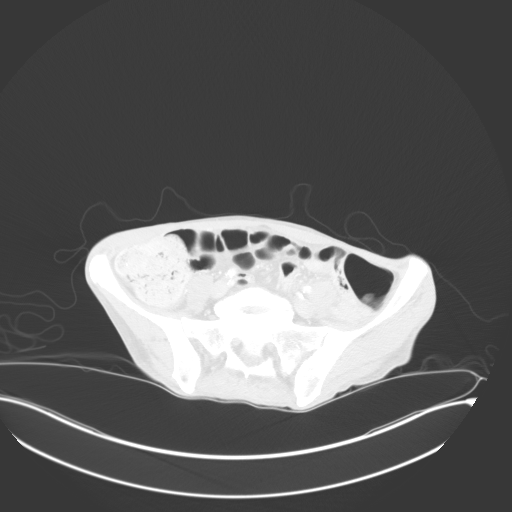
[im 53/133  lung]
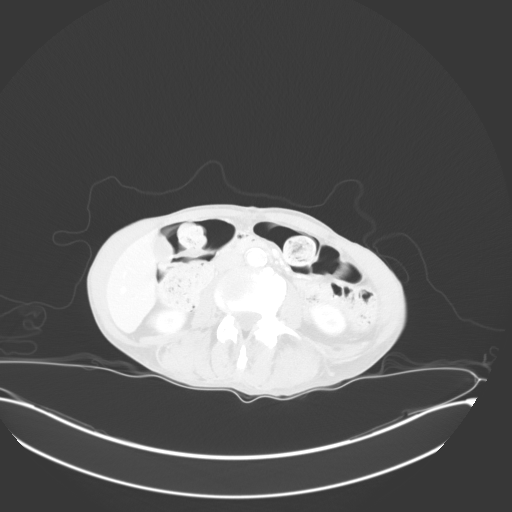
[im 67/133  mediastinal]
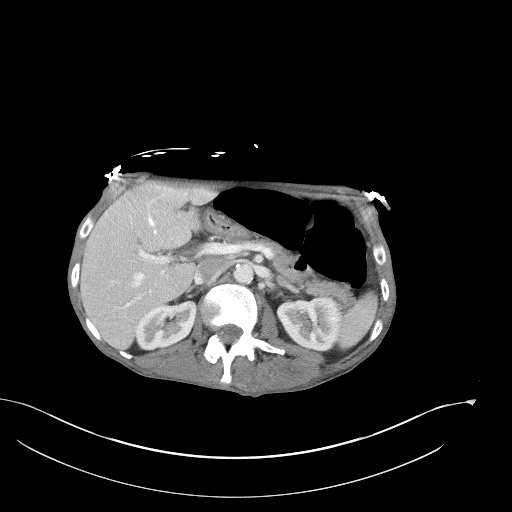
[im 67/133  lung]
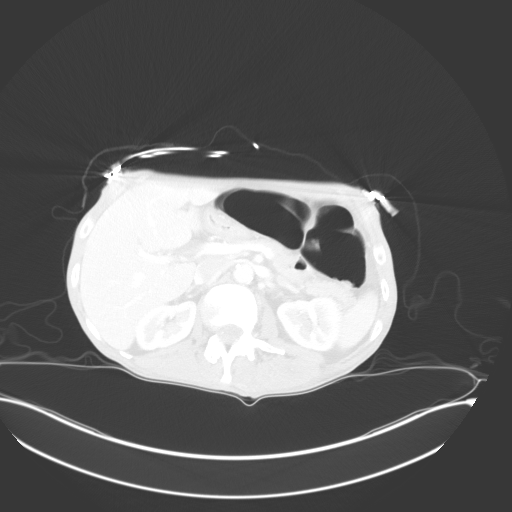
[im 80/133  lung]
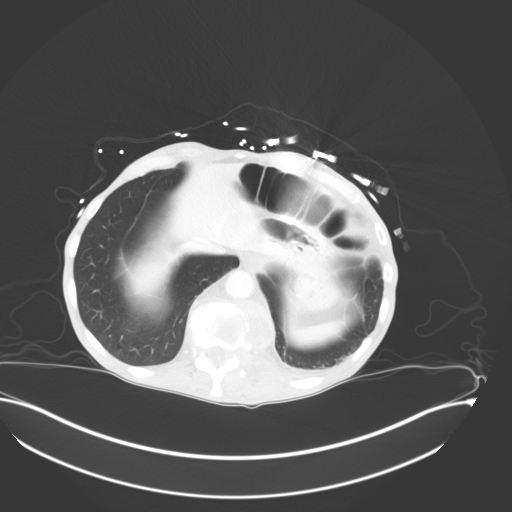
[im 93/133  lung]
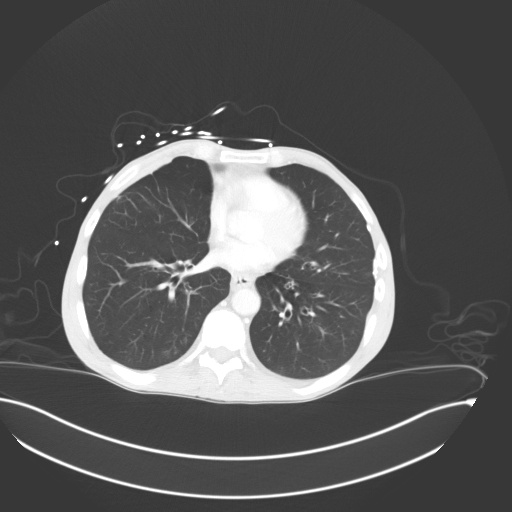
[im 106/133  lung]
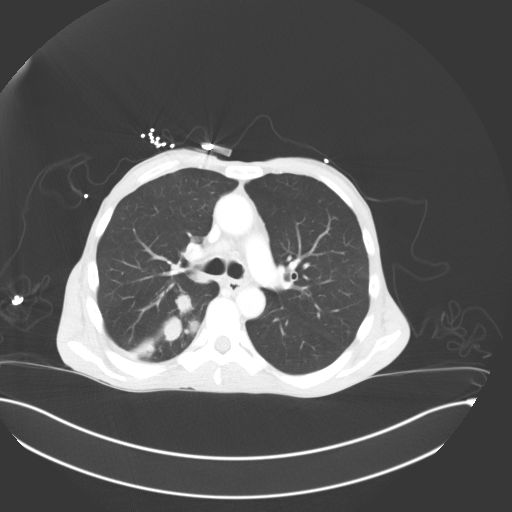
[im 119/133  mediastinal]
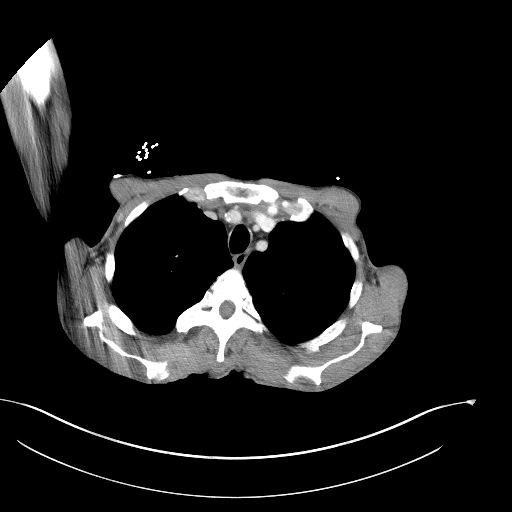
[im 119/133  lung]
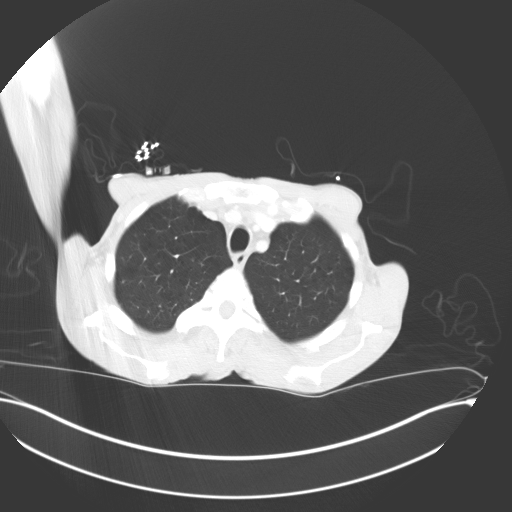

[Series 3: coronals · coronal · 0.88mm/px · 3 of 134 slices shown]
[im 27/134  lung]
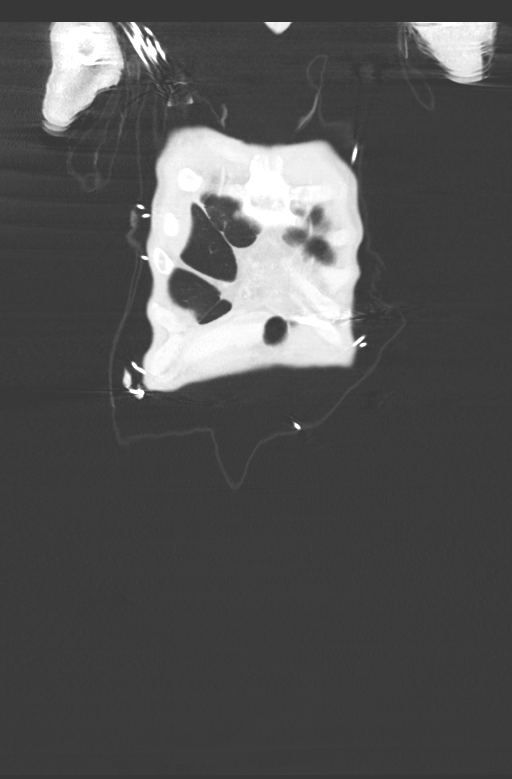
[im 54/134  lung]
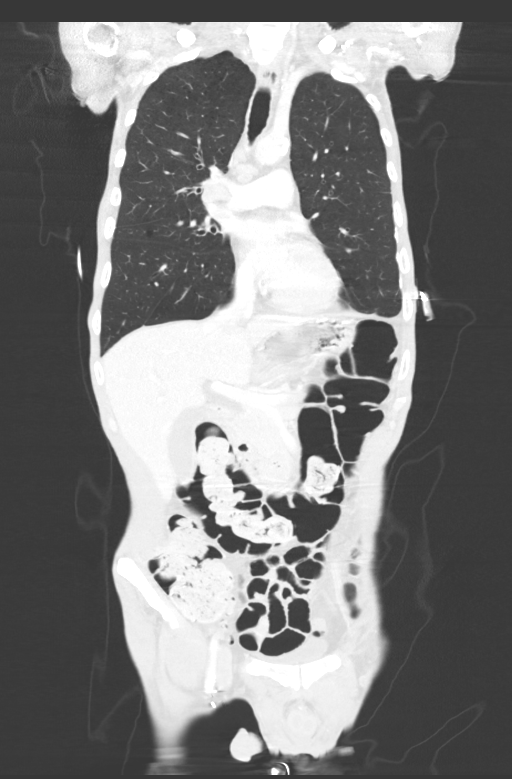
[im 80/134  lung]
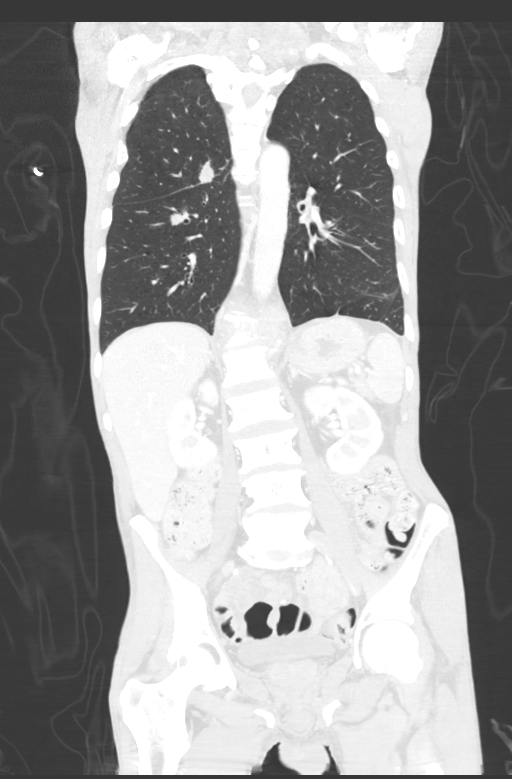

[12 of 36 positions shown; findings below may reference images not displayed]

FINDINGS: CT CHEST FINDINGS

Cardiovascular: Heart size normal. No pericardial effusion.
Calcified plaque in the aortic arch.

Mediastinum/Nodes: Enlarged right hilar lymph nodes up to 1.9 cm
short axis diameter. 1.4 cm subcarinal adenopathy. 1 cm enlarged
precarinal node. Prominent subcentimeter right paratracheal and AP
window lymph nodes.

Lungs/Pleura: No pleural effusion. Partially calcified left pleural
plaque. No pneumothorax. 5.4 cm lobular pleural-based mass, superior
segment right lower lobe. Anteriorly and superiorly is a cluster of
approximately 6 nodules measuring up to 2.3 cm. Bandlike scarring or
subsegmental atelectasis in the anterior right upper lobe. 3 mm
subpleural nodule medially in the left upper lobe (Im27,Se5) . left
lung otherwise clear.

Musculoskeletal: 9.5 cm complex lytic lesion involving the T12
vertebral body, left pedicle and posterior elements with
extraosseous extension and epidural extension of tumor resulting in
at least mild narrowing of the central canal. There is moderate
pathologic compression deformity of the T12 vertebral body. Old
fracture deformities of multiple right ribs. Lytic lesion involving
the lateral aspect right sixth rib. Bilateral shoulder DJD.

CT ABDOMEN PELVIS FINDINGS

Hepatobiliary: No focal liver abnormality is seen. No gallstones,
gallbladder wall thickening, or biliary dilatation.

Pancreas: Unremarkable. No pancreatic ductal dilatation or
surrounding inflammatory changes.

Spleen: Normal in size. 1 cm poorly marginated low-attenuation
lesion inferolaterally, indeterminate.

Adrenals/Urinary Tract: Left adrenal nodule measuring at least
cm. Right adrenal unremarkable. Normal renal enhancement without
focal lesion, hydronephrosis, or urolithiasis. The urinary bladder
is incompletely distended.

Stomach/Bowel: The stomach is nondistended. Small bowel
decompressed. Normal appendix. The colon is nondilated,
unremarkable.

Vascular/Lymphatic: Moderate aortic and iliofemoral calcified
arterial plaque without aneurysm or evident high-grade stenosis. No
abdominal or pelvic adenopathy.

Reproductive: Mild prostate enlargement with central coarse
calcification.

Other: No ascites.  No free air.

Musculoskeletal: 1 cm lytic lesion involving the medial aspect right
ilium. 5.1 cm lytic lesion involving left pubic bone with
extraosseous extension of tumor anteriorly. 0.7 cm lytic lesion
involving the anterior margin of the ilium. No pathologic fracture.
IMPRESSION: 1. 5.4 cm right lower lobe lung mass with satellite nodules, right
hilar and mediastinal adenopathy, osseous and possibly left adrenal
metastatic disease as above, suggesting stage IV lung carcinoma.
2. Pathologic T12 compression fracture with epidural extension of
tumor and at least mild spinal stenosis.
3.  Aortic Atherosclerosis (4KE8N-170.0).
